# Patient Record
Sex: Male | Born: 1965 | Race: White | Hispanic: No | Marital: Single | Smoking: Current every day smoker
Health system: Southern US, Community
[De-identification: ages and names within clinical notes are randomized; demographics above are authoritative.]

## PROBLEM LIST (undated history)

## (undated) DIAGNOSIS — F1911 Other psychoactive substance abuse, in remission: Secondary | ICD-10-CM

## (undated) DIAGNOSIS — T8859XA Other complications of anesthesia, initial encounter: Secondary | ICD-10-CM

## (undated) DIAGNOSIS — Z9889 Other specified postprocedural states: Secondary | ICD-10-CM

## (undated) DIAGNOSIS — M1990 Unspecified osteoarthritis, unspecified site: Secondary | ICD-10-CM

## (undated) DIAGNOSIS — T4145XA Adverse effect of unspecified anesthetic, initial encounter: Secondary | ICD-10-CM

## (undated) DIAGNOSIS — M109 Gout, unspecified: Secondary | ICD-10-CM

## (undated) DIAGNOSIS — I499 Cardiac arrhythmia, unspecified: Secondary | ICD-10-CM

## (undated) DIAGNOSIS — C4490 Unspecified malignant neoplasm of skin, unspecified: Secondary | ICD-10-CM

## (undated) DIAGNOSIS — F329 Major depressive disorder, single episode, unspecified: Secondary | ICD-10-CM

## (undated) DIAGNOSIS — I10 Essential (primary) hypertension: Secondary | ICD-10-CM

## (undated) DIAGNOSIS — F32A Depression, unspecified: Secondary | ICD-10-CM

## (undated) DIAGNOSIS — K435 Parastomal hernia without obstruction or  gangrene: Secondary | ICD-10-CM

## (undated) DIAGNOSIS — F319 Bipolar disorder, unspecified: Secondary | ICD-10-CM

## (undated) DIAGNOSIS — B958 Unspecified staphylococcus as the cause of diseases classified elsewhere: Secondary | ICD-10-CM

## (undated) DIAGNOSIS — T7840XA Allergy, unspecified, initial encounter: Secondary | ICD-10-CM

## (undated) DIAGNOSIS — R112 Nausea with vomiting, unspecified: Secondary | ICD-10-CM

## (undated) DIAGNOSIS — K5190 Ulcerative colitis, unspecified, without complications: Secondary | ICD-10-CM

## (undated) DIAGNOSIS — K219 Gastro-esophageal reflux disease without esophagitis: Secondary | ICD-10-CM

## (undated) DIAGNOSIS — E119 Type 2 diabetes mellitus without complications: Secondary | ICD-10-CM

## (undated) HISTORY — DX: Other psychoactive substance abuse, in remission: F19.11

## (undated) HISTORY — DX: Major depressive disorder, single episode, unspecified: F32.9

## (undated) HISTORY — DX: Parastomal hernia without obstruction or gangrene: K43.5

## (undated) HISTORY — DX: Unspecified malignant neoplasm of skin, unspecified: C44.90

## (undated) HISTORY — PX: OTHER SURGICAL HISTORY: SHX169

## (undated) HISTORY — DX: Bipolar disorder, unspecified: F31.9

## (undated) HISTORY — DX: Allergy, unspecified, initial encounter: T78.40XA

## (undated) HISTORY — DX: Depression, unspecified: F32.A

---
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
## 1988-09-24 DIAGNOSIS — K5190 Ulcerative colitis, unspecified, without complications: Secondary | ICD-10-CM

## 1988-09-24 HISTORY — PX: APPENDECTOMY: SHX54

## 1988-09-24 HISTORY — PX: COLECTOMY: SHX59

## 1988-09-24 HISTORY — PX: ILEOSTOMY: SHX1783

## 1988-09-24 HISTORY — PX: TOTAL COLECTOMY: SHX852

## 1988-09-24 HISTORY — DX: Ulcerative colitis, unspecified, without complications: K51.90

## 1994-09-24 HISTORY — PX: ABDOMINAL ADHESION SURGERY: SHX90

## 1999-09-25 HISTORY — PX: SHOULDER SURGERY: SHX246

## 1999-09-25 HISTORY — PX: UMBILICAL HERNIA REPAIR: SHX196

## 2006-02-12 ENCOUNTER — Other Ambulatory Visit: Payer: Self-pay

## 2006-02-12 ENCOUNTER — Emergency Department: Payer: Self-pay | Admitting: Unknown Physician Specialty

## 2009-09-24 DIAGNOSIS — I10 Essential (primary) hypertension: Secondary | ICD-10-CM

## 2009-09-24 HISTORY — PX: HAND SURGERY: SHX662

## 2009-09-24 HISTORY — DX: Essential (primary) hypertension: I10

## 2009-12-17 ENCOUNTER — Emergency Department: Payer: Self-pay | Admitting: Unknown Physician Specialty

## 2011-09-25 DIAGNOSIS — C4490 Unspecified malignant neoplasm of skin, unspecified: Secondary | ICD-10-CM

## 2011-09-25 HISTORY — DX: Unspecified malignant neoplasm of skin, unspecified: C44.90

## 2011-11-22 ENCOUNTER — Emergency Department: Payer: Self-pay | Admitting: Internal Medicine

## 2012-06-11 ENCOUNTER — Inpatient Hospital Stay: Payer: Self-pay | Admitting: Internal Medicine

## 2012-06-11 LAB — CBC WITH DIFFERENTIAL/PLATELET
Basophil %: 0.2 %
Eosinophil #: 0 10*3/uL (ref 0.0–0.7)
HCT: 29.9 % — ABNORMAL LOW (ref 40.0–52.0)
HGB: 10.5 g/dL — ABNORMAL LOW (ref 13.0–18.0)
Lymphocyte %: 3.2 %
MCH: 30.4 pg (ref 26.0–34.0)
MCHC: 35.2 g/dL (ref 32.0–36.0)
MCV: 87 fL (ref 80–100)
Monocyte #: 0.9 x10 3/mm (ref 0.2–1.0)
Neutrophil #: 18.8 10*3/uL — ABNORMAL HIGH (ref 1.4–6.5)
RBC: 3.46 10*6/uL — ABNORMAL LOW (ref 4.40–5.90)
WBC: 20.4 10*3/uL — ABNORMAL HIGH (ref 3.8–10.6)

## 2012-06-11 LAB — BASIC METABOLIC PANEL
Anion Gap: 24 — ABNORMAL HIGH (ref 7–16)
Anion Gap: 23 — ABNORMAL HIGH (ref 7–16)
BUN: 104 mg/dL — ABNORMAL HIGH (ref 7–18)
BUN: 105 mg/dL — ABNORMAL HIGH (ref 7–18)
Calcium, Total: 6.9 mg/dL — CL (ref 8.5–10.1)
Co2: 12 mmol/L — ABNORMAL LOW (ref 21–32)
Creatinine: 18.9 mg/dL — ABNORMAL HIGH (ref 0.60–1.30)
EGFR (African American): 3 — ABNORMAL LOW
EGFR (African American): 3 — ABNORMAL LOW
EGFR (Non-African Amer.): 3 — ABNORMAL LOW
EGFR (Non-African Amer.): 3 — ABNORMAL LOW
Glucose: 76 mg/dL (ref 65–99)
Osmolality: 274 (ref 275–301)
Sodium: 120 mmol/L — CL (ref 136–145)

## 2012-06-11 LAB — URINALYSIS, COMPLETE
Glucose,UR: NEGATIVE mg/dL (ref 0–75)
Hyaline Cast: 23
Ketone: NEGATIVE
Nitrite: NEGATIVE
Protein: 100
RBC,UR: 48 /HPF (ref 0–5)
WBC UR: 34 /HPF (ref 0–5)

## 2012-06-11 LAB — COMPREHENSIVE METABOLIC PANEL
Albumin: 3.7 g/dL (ref 3.4–5.0)
Anion Gap: 36 — ABNORMAL HIGH (ref 7–16)
BUN: 114 mg/dL — ABNORMAL HIGH (ref 7–18)
Co2: 8 mmol/L — CL (ref 21–32)
Creatinine: 21.98 mg/dL — ABNORMAL HIGH (ref 0.60–1.30)
Glucose: 168 mg/dL — ABNORMAL HIGH (ref 65–99)
Osmolality: 256 (ref 275–301)
Potassium: 6.5 mmol/L (ref 3.5–5.1)
SGOT(AST): 121 U/L — ABNORMAL HIGH (ref 15–37)
Sodium: 106 mmol/L — CL (ref 136–145)
Total Protein: 9.1 g/dL — ABNORMAL HIGH (ref 6.4–8.2)

## 2012-06-11 LAB — CBC
HCT: 39.8 % — ABNORMAL LOW (ref 40.0–52.0)
HGB: 14.2 g/dL (ref 13.0–18.0)
MCH: 31 pg (ref 26.0–34.0)
MCV: 87 fL (ref 80–100)
Platelet: 406 10*3/uL (ref 150–440)
RDW: 14.5 % (ref 11.5–14.5)
WBC: 24.4 10*3/uL — ABNORMAL HIGH (ref 3.8–10.6)

## 2012-06-11 LAB — PROTIME-INR
INR: 1.1
Prothrombin Time: 15 secs — ABNORMAL HIGH (ref 11.5–14.7)

## 2012-06-11 LAB — SODIUM: Sodium: 132 mmol/L — ABNORMAL LOW (ref 136–145)

## 2012-06-11 LAB — CK TOTAL AND CKMB (NOT AT ARMC)
CK, Total: 8422 U/L — ABNORMAL HIGH (ref 35–232)
CK-MB: 74.5 ng/mL — ABNORMAL HIGH (ref 0.5–3.6)

## 2012-06-11 LAB — PHOSPHORUS: Phosphorus: 15.3 mg/dL — ABNORMAL HIGH (ref 2.5–4.9)

## 2012-06-11 LAB — APTT: Activated PTT: 37.6 secs — ABNORMAL HIGH (ref 23.6–35.9)

## 2012-06-12 LAB — COMPREHENSIVE METABOLIC PANEL
Albumin: 2.3 g/dL — ABNORMAL LOW (ref 3.4–5.0)
Alkaline Phosphatase: 87 U/L (ref 50–136)
Anion Gap: 22 — ABNORMAL HIGH (ref 7–16)
BUN: 100 mg/dL — ABNORMAL HIGH (ref 7–18)
Bilirubin,Total: 0.5 mg/dL (ref 0.2–1.0)
Calcium, Total: 6.6 mg/dL — CL (ref 8.5–10.1)
Chloride: 82 mmol/L — ABNORMAL LOW (ref 98–107)
Glucose: 112 mg/dL — ABNORMAL HIGH (ref 65–99)
Potassium: 3.5 mmol/L (ref 3.5–5.1)
SGOT(AST): 87 U/L — ABNORMAL HIGH (ref 15–37)
Sodium: 118 mmol/L — CL (ref 136–145)
Total Protein: 5.8 g/dL — ABNORMAL LOW (ref 6.4–8.2)

## 2012-06-12 LAB — CBC WITH DIFFERENTIAL/PLATELET
Basophil #: 0 10*3/uL (ref 0.0–0.1)
Basophil %: 0.1 %
Eosinophil #: 0.1 10*3/uL (ref 0.0–0.7)
HCT: 29.4 % — ABNORMAL LOW (ref 40.0–52.0)
HGB: 10.4 g/dL — ABNORMAL LOW (ref 13.0–18.0)
Lymphocyte %: 3.3 %
MCH: 30.4 pg (ref 26.0–34.0)
MCHC: 35.3 g/dL (ref 32.0–36.0)
Monocyte #: 1 x10 3/mm (ref 0.2–1.0)
Neutrophil #: 13 10*3/uL — ABNORMAL HIGH (ref 1.4–6.5)
Neutrophil %: 89.4 %
Platelet: 207 10*3/uL (ref 150–440)

## 2012-06-12 LAB — CK: CK, Total: 4129 U/L — ABNORMAL HIGH (ref 35–232)

## 2012-06-12 LAB — SODIUM
Sodium: 117 mmol/L — CL (ref 136–145)
Sodium: 117 mmol/L — CL (ref 136–145)
Sodium: 116 mmol/L — CL (ref 136–145)
Sodium: 118 mmol/L — CL (ref 136–145)
Sodium: 119 mmol/L — CL (ref 136–145)

## 2012-06-12 LAB — URINE CULTURE

## 2012-06-12 LAB — MAGNESIUM: Magnesium: 1.3 mg/dL — ABNORMAL LOW

## 2012-06-13 LAB — BASIC METABOLIC PANEL
Anion Gap: 16 (ref 7–16)
Anion Gap: 14 (ref 7–16)
Anion Gap: 15 (ref 7–16)
Anion Gap: 12 (ref 7–16)
BUN: 87 mg/dL — ABNORMAL HIGH (ref 7–18)
BUN: 86 mg/dL — ABNORMAL HIGH (ref 7–18)
BUN: 79 mg/dL — ABNORMAL HIGH (ref 7–18)
BUN: 76 mg/dL — ABNORMAL HIGH (ref 7–18)
Calcium, Total: 7.1 mg/dL — ABNORMAL LOW (ref 8.5–10.1)
Calcium, Total: 7.1 mg/dL — ABNORMAL LOW (ref 8.5–10.1)
Calcium, Total: 7.3 mg/dL — ABNORMAL LOW (ref 8.5–10.1)
Calcium, Total: 7.8 mg/dL — ABNORMAL LOW (ref 8.5–10.1)
Chloride: 93 mmol/L — ABNORMAL LOW (ref 98–107)
Co2: 19 mmol/L — ABNORMAL LOW (ref 21–32)
Co2: 18 mmol/L — ABNORMAL LOW (ref 21–32)
Creatinine: 6.95 mg/dL — ABNORMAL HIGH (ref 0.60–1.30)
Creatinine: 6.21 mg/dL — ABNORMAL HIGH (ref 0.60–1.30)
Creatinine: 4.82 mg/dL — ABNORMAL HIGH (ref 0.60–1.30)
EGFR (African American): 10 — ABNORMAL LOW
EGFR (African American): 11 — ABNORMAL LOW
EGFR (African American): 23 — ABNORMAL LOW
EGFR (Non-African Amer.): 9 — ABNORMAL LOW
EGFR (Non-African Amer.): 20 — ABNORMAL LOW
Glucose: 96 mg/dL (ref 65–99)
Glucose: 137 mg/dL — ABNORMAL HIGH (ref 65–99)
Glucose: 92 mg/dL (ref 65–99)
Glucose: 93 mg/dL (ref 65–99)
Osmolality: 277 (ref 275–301)
Osmolality: 281 (ref 275–301)
Potassium: 3.3 mmol/L — ABNORMAL LOW (ref 3.5–5.1)
Potassium: 3.7 mmol/L (ref 3.5–5.1)
Potassium: 3.4 mmol/L — ABNORMAL LOW (ref 3.5–5.1)
Sodium: 123 mmol/L — ABNORMAL LOW (ref 136–145)
Sodium: 123 mmol/L — ABNORMAL LOW (ref 136–145)

## 2012-06-13 LAB — CK: CK, Total: 1538 U/L — ABNORMAL HIGH (ref 35–232)

## 2012-06-13 LAB — COMPREHENSIVE METABOLIC PANEL
Albumin: 2.2 g/dL — ABNORMAL LOW (ref 3.4–5.0)
Alkaline Phosphatase: 83 U/L (ref 50–136)
BUN: 88 mg/dL — ABNORMAL HIGH (ref 7–18)
Bilirubin,Total: 0.5 mg/dL (ref 0.2–1.0)
Calcium, Total: 6.9 mg/dL — CL (ref 8.5–10.1)
Chloride: 86 mmol/L — ABNORMAL LOW (ref 98–107)
Creatinine: 7.6 mg/dL — ABNORMAL HIGH (ref 0.60–1.30)
EGFR (Non-African Amer.): 8 — ABNORMAL LOW
Glucose: 128 mg/dL — ABNORMAL HIGH (ref 65–99)
Osmolality: 273 (ref 275–301)
Sodium: 121 mmol/L — ABNORMAL LOW (ref 136–145)
Total Protein: 5.5 g/dL — ABNORMAL LOW (ref 6.4–8.2)

## 2012-06-13 LAB — CBC WITH DIFFERENTIAL/PLATELET
Basophil #: 0 10*3/uL (ref 0.0–0.1)
Basophil %: 0.4 %
Eosinophil #: 0.1 10*3/uL (ref 0.0–0.7)
HGB: 9.5 g/dL — ABNORMAL LOW (ref 13.0–18.0)
Lymphocyte #: 0.8 10*3/uL — ABNORMAL LOW (ref 1.0–3.6)
MCH: 29.6 pg (ref 26.0–34.0)
MCHC: 34.5 g/dL (ref 32.0–36.0)
MCV: 86 fL (ref 80–100)
Monocyte #: 1 x10 3/mm (ref 0.2–1.0)
Monocyte %: 7.3 %
Neutrophil #: 11.5 10*3/uL — ABNORMAL HIGH (ref 1.4–6.5)
Neutrophil %: 85.7 %
WBC: 13.4 10*3/uL — ABNORMAL HIGH (ref 3.8–10.6)

## 2012-06-14 LAB — BASIC METABOLIC PANEL
Anion Gap: 15 (ref 7–16)
Anion Gap: 14 (ref 7–16)
Calcium, Total: 7.3 mg/dL — ABNORMAL LOW (ref 8.5–10.1)
Calcium, Total: 7.4 mg/dL — ABNORMAL LOW (ref 8.5–10.1)
Calcium, Total: 7.6 mg/dL — ABNORMAL LOW (ref 8.5–10.1)
Chloride: 100 mmol/L (ref 98–107)
Chloride: 99 mmol/L (ref 98–107)
Co2: 17 mmol/L — ABNORMAL LOW (ref 21–32)
Co2: 18 mmol/L — ABNORMAL LOW (ref 21–32)
Co2: 19 mmol/L — ABNORMAL LOW (ref 21–32)
Creatinine: 2.21 mg/dL — ABNORMAL HIGH (ref 0.60–1.30)
EGFR (African American): 31 — ABNORMAL LOW
EGFR (African American): 40 — ABNORMAL LOW
EGFR (African American): 59 — ABNORMAL LOW
EGFR (Non-African Amer.): 26 — ABNORMAL LOW
EGFR (Non-African Amer.): 51 — ABNORMAL LOW
Glucose: 105 mg/dL — ABNORMAL HIGH (ref 65–99)
Osmolality: 282 (ref 275–301)
Osmolality: 284 (ref 275–301)
Osmolality: 285 (ref 275–301)
Sodium: 130 mmol/L — ABNORMAL LOW (ref 136–145)
Sodium: 133 mmol/L — ABNORMAL LOW (ref 136–145)

## 2012-06-17 LAB — CULTURE, BLOOD (SINGLE)

## 2012-06-19 ENCOUNTER — Other Ambulatory Visit: Payer: Self-pay | Admitting: Nephrology

## 2012-06-19 LAB — COMPREHENSIVE METABOLIC PANEL
Alkaline Phosphatase: 107 U/L (ref 50–136)
Anion Gap: 7 (ref 7–16)
BUN: 12 mg/dL (ref 7–18)
Bilirubin,Total: 0.5 mg/dL (ref 0.2–1.0)
Chloride: 106 mmol/L (ref 98–107)
Co2: 25 mmol/L (ref 21–32)
Creatinine: 1.04 mg/dL (ref 0.60–1.30)
EGFR (African American): >60
EGFR (Non-African Amer.): >60
Osmolality: 274 (ref 275–301)
Potassium: 4.5 mmol/L (ref 3.5–5.1)
SGPT (ALT): 42 U/L (ref 12–78)
Sodium: 138 mmol/L (ref 136–145)
Total Protein: 7.6 g/dL (ref 6.4–8.2)

## 2012-07-06 ENCOUNTER — Emergency Department: Payer: Self-pay | Admitting: Emergency Medicine

## 2012-07-06 LAB — URINALYSIS, COMPLETE
Blood: NEGATIVE
Leukocyte Esterase: NEGATIVE
Ph: 6 (ref 4.5–8.0)
Protein: NEGATIVE
RBC,UR: 1 /HPF (ref 0–5)

## 2012-07-06 LAB — COMPREHENSIVE METABOLIC PANEL
Alkaline Phosphatase: 153 U/L — ABNORMAL HIGH (ref 50–136)
Anion Gap: 11 (ref 7–16)
BUN: 18 mg/dL (ref 7–18)
Bilirubin,Total: 0.9 mg/dL (ref 0.2–1.0)
Chloride: 102 mmol/L (ref 98–107)
Creatinine: 1.37 mg/dL — ABNORMAL HIGH (ref 0.60–1.30)
EGFR (African American): >60
EGFR (Non-African Amer.): >60
Osmolality: 273 (ref 275–301)
SGPT (ALT): 34 U/L (ref 12–78)
Sodium: 135 mmol/L — ABNORMAL LOW (ref 136–145)
Total Protein: 7.9 g/dL (ref 6.4–8.2)

## 2012-07-06 LAB — CBC
HCT: 34.5 % — ABNORMAL LOW (ref 40.0–52.0)
HGB: 11.9 g/dL — ABNORMAL LOW (ref 13.0–18.0)
MCH: 30.3 pg (ref 26.0–34.0)
MCV: 88 fL (ref 80–100)
RBC: 3.93 10*6/uL — ABNORMAL LOW (ref 4.40–5.90)
WBC: 10.4 10*3/uL (ref 3.8–10.6)

## 2012-07-06 LAB — CK: CK, Total: 39 U/L (ref 35–232)

## 2012-07-17 ENCOUNTER — Other Ambulatory Visit: Payer: Self-pay

## 2012-07-17 LAB — CBC WITH DIFFERENTIAL/PLATELET
Basophil #: 0.1 10*3/uL (ref 0.0–0.1)
Basophil %: 0.6 %
HCT: 38.5 % — ABNORMAL LOW (ref 40.0–52.0)
Lymphocyte #: 1.5 10*3/uL (ref 1.0–3.6)
Lymphocyte %: 10.1 %
MCHC: 33.9 g/dL (ref 32.0–36.0)
MCV: 89 fL (ref 80–100)
Monocyte #: 0.9 x10 3/mm (ref 0.2–1.0)
Monocyte %: 6.3 %
Neutrophil #: 12 10*3/uL — ABNORMAL HIGH (ref 1.4–6.5)
Neutrophil %: 82.5 %
Platelet: 381 10*3/uL (ref 150–440)
RDW: 14.5 % (ref 11.5–14.5)
WBC: 14.6 10*3/uL — ABNORMAL HIGH (ref 3.8–10.6)

## 2012-07-17 LAB — COMPREHENSIVE METABOLIC PANEL
Albumin: 3.9 g/dL (ref 3.4–5.0)
Alkaline Phosphatase: 124 U/L (ref 50–136)
Anion Gap: 9 (ref 7–16)
Calcium, Total: 9.3 mg/dL (ref 8.5–10.1)
Chloride: 104 mmol/L (ref 98–107)
Co2: 22 mmol/L (ref 21–32)
Creatinine: 1.03 mg/dL (ref 0.60–1.30)
EGFR (African American): >60
EGFR (Non-African Amer.): >60
Glucose: 92 mg/dL (ref 65–99)
SGOT(AST): 24 U/L (ref 15–37)
SGPT (ALT): 30 U/L (ref 12–78)

## 2012-10-10 ENCOUNTER — Emergency Department: Payer: Self-pay | Admitting: Emergency Medicine

## 2012-10-10 LAB — CBC WITH DIFFERENTIAL/PLATELET
Basophil #: 0.1 10*3/uL (ref 0.0–0.1)
Eosinophil %: 0.6 %
HGB: 15.6 g/dL (ref 13.0–18.0)
Lymphocyte %: 9.5 %
MCH: 28.8 pg (ref 26.0–34.0)
MCHC: 33.8 g/dL (ref 32.0–36.0)
Monocyte #: 0.8 x10 3/mm (ref 0.2–1.0)
Monocyte %: 6.7 %
Neutrophil %: 82.1 %
RDW: 14.4 % (ref 11.5–14.5)
WBC: 12.1 10*3/uL — ABNORMAL HIGH (ref 3.8–10.6)

## 2012-10-10 LAB — URINALYSIS, COMPLETE
Bacteria: NONE SEEN
Bilirubin,UR: NEGATIVE
Blood: NEGATIVE
Glucose,UR: NEGATIVE mg/dL (ref 0–75)
Nitrite: NEGATIVE
Protein: 30
RBC,UR: 1 /HPF (ref 0–5)
Specific Gravity: 1.03 (ref 1.003–1.030)
Squamous Epithelial: <1

## 2012-10-10 LAB — COMPREHENSIVE METABOLIC PANEL
Anion Gap: 11 (ref 7–16)
BUN: 12 mg/dL (ref 7–18)
Calcium, Total: 9.7 mg/dL (ref 8.5–10.1)
Co2: 20 mmol/L — ABNORMAL LOW (ref 21–32)
Creatinine: 1.22 mg/dL (ref 0.60–1.30)
Glucose: 95 mg/dL (ref 65–99)
Sodium: 133 mmol/L — ABNORMAL LOW (ref 136–145)
Total Protein: 8.5 g/dL — ABNORMAL HIGH (ref 6.4–8.2)

## 2013-03-11 ENCOUNTER — Emergency Department: Payer: Self-pay

## 2013-03-11 LAB — URINALYSIS, COMPLETE
Bilirubin,UR: NEGATIVE
Blood: NEGATIVE
Glucose,UR: NEGATIVE mg/dL (ref 0–75)
Hyaline Cast: 2
Leukocyte Esterase: NEGATIVE
Nitrite: NEGATIVE
RBC,UR: 1 /HPF (ref 0–5)
Specific Gravity: 1.029 (ref 1.003–1.030)
Squamous Epithelial: NONE SEEN

## 2013-03-11 LAB — CK TOTAL AND CKMB (NOT AT ARMC): CK-MB: <0.5 ng/mL — ABNORMAL LOW (ref 0.5–3.6)

## 2013-03-11 LAB — TROPONIN I: Troponin-I: <0.02 ng/mL

## 2013-03-11 LAB — BASIC METABOLIC PANEL
Calcium, Total: 9.6 mg/dL (ref 8.5–10.1)
Co2: 24 mmol/L (ref 21–32)
EGFR (African American): >60
EGFR (Non-African Amer.): >60
Glucose: 115 mg/dL — ABNORMAL HIGH (ref 65–99)
Potassium: 3.5 mmol/L (ref 3.5–5.1)
Sodium: 135 mmol/L — ABNORMAL LOW (ref 136–145)

## 2013-03-11 LAB — CBC
HCT: 43.9 % (ref 40.0–52.0)
MCH: 28.7 pg (ref 26.0–34.0)
MCHC: 33.9 g/dL (ref 32.0–36.0)
MCV: 85 fL (ref 80–100)
Platelet: 355 10*3/uL (ref 150–440)
RDW: 14.7 % — ABNORMAL HIGH (ref 11.5–14.5)
WBC: 13.8 10*3/uL — ABNORMAL HIGH (ref 3.8–10.6)

## 2013-04-26 ENCOUNTER — Emergency Department: Payer: Self-pay | Admitting: Emergency Medicine

## 2013-04-26 LAB — URINALYSIS, COMPLETE
Bilirubin,UR: NEGATIVE
Ketone: NEGATIVE
Nitrite: NEGATIVE
Ph: 6 (ref 4.5–8.0)
Protein: 30
Specific Gravity: 1.019 (ref 1.003–1.030)
Squamous Epithelial: NONE SEEN

## 2013-04-26 LAB — TROPONIN I: Troponin-I: <0.02 ng/mL

## 2013-04-26 LAB — COMPREHENSIVE METABOLIC PANEL
Alkaline Phosphatase: 136 U/L (ref 50–136)
Bilirubin,Total: 1.5 mg/dL — ABNORMAL HIGH (ref 0.2–1.0)
Co2: 24 mmol/L (ref 21–32)
EGFR (African American): >60
EGFR (Non-African Amer.): >60
Glucose: 110 mg/dL — ABNORMAL HIGH (ref 65–99)
Osmolality: 267 (ref 275–301)
SGOT(AST): 26 U/L (ref 15–37)
Sodium: 133 mmol/L — ABNORMAL LOW (ref 136–145)
Total Protein: 8.7 g/dL — ABNORMAL HIGH (ref 6.4–8.2)

## 2013-04-26 LAB — CBC
HCT: 47.2 % (ref 40.0–52.0)
HGB: 16.5 g/dL (ref 13.0–18.0)
Platelet: 389 10*3/uL (ref 150–440)
WBC: 13.8 10*3/uL — ABNORMAL HIGH (ref 3.8–10.6)

## 2013-07-29 LAB — LIPID PANEL
CHOLESTEROL: 159 mg/dL (ref 0–200)
HDL: 69 mg/dL (ref 35–70)
LDL CALC: 49 mg/dL
LDL/HDL RATIO: 0.7
TRIGLYCERIDES: 207 mg/dL — AB (ref 40–160)

## 2013-08-18 ENCOUNTER — Ambulatory Visit: Payer: Self-pay | Admitting: Family Medicine

## 2013-12-17 LAB — PSA: PSA: 1.5

## 2014-09-28 DIAGNOSIS — C4910 Malignant neoplasm of connective and soft tissue of unspecified upper limb, including shoulder: Secondary | ICD-10-CM | POA: Insufficient documentation

## 2014-10-14 LAB — BASIC METABOLIC PANEL
BUN: 13 mg/dL (ref 4–21)
CREATININE: 1.2 mg/dL (ref 0.6–1.3)
Glucose: 109 mg/dL
SODIUM: 137 mmol/L (ref 137–147)

## 2014-10-14 LAB — TSH: TSH: 0.77 u[IU]/mL (ref 0.41–5.90)

## 2014-10-14 LAB — CBC AND DIFFERENTIAL
NEUTROS ABS: 9 /uL
WBC: 11 10*3/mL

## 2014-10-14 LAB — HEPATIC FUNCTION PANEL
ALT: 49 U/L — AB (ref 10–40)
AST: 40 U/L (ref 14–40)
Alkaline Phosphatase: 107 U/L (ref 25–125)

## 2015-01-11 NOTE — Consult Note (Signed)
Brief Consult Note: Diagnosis: Nausea, vomiting and renal failure.   Patient was seen by consultant.   Consult note dictated.   Comments: Patient with renal failure and severe hyponatremia. History of nausea and vomiting but no change in ileostomy output. Doubt UC has anything to with this episode as he has no colon left and ileostomy seems to at baseline.  Recommendations: Continue IV antibiotics. Clear liquids, advance as tolerated. Will follow.  Electronic Signatures: Jill Side (MD)  (Signed 18-Sep-13 18:44)  Authored: Brief Consult Note   Last Updated: 18-Sep-13 18:44 by Jill Side (MD)

## 2015-01-11 NOTE — Consult Note (Signed)
PATIENT NAME:  Joshua Mays, Joshua Mays MR#:  [id number] DATE OF BIRTH:  [birth year]  DATE OF CONSULTATION:    REFERRING PHYSICIAN:  Dr. Darvin Neighbours  CONSULTING PHYSICIAN:  Marda Breidenbach Lilian Kapur, MD  REASON FOR CONSULTATION: Severe acute renal failure, metabolic acidosis, hyponatremia, hyperkalemia.   HISTORY OF PRESENT ILLNESS: The patient is a 49 year old Caucasian male with past medical history of hypertension, history of polysubstance dependence, history of borderline personality disorder, and depression with anxiety who presented to [facility] after not being able to walk at home. Patient reports that he has had nausea and vomiting ongoing for the past week. He has had very limited p.o. and fluid intake over this period of time. In addition to this, he reports that he was taking ibuprofen, sometimes up to 2400 mg over the past week. He has reported quite diminished urine output and urine that he was making was quite dark in color. When he presented he had multiple metabolic derangements including a BUN of 114, creatinine of 21, serum sodium of 106, serum potassium of 6.5, serum bicarbonate quite low at 8, phosphorus of 15. He also has evidence for mild rhabdomyolysis with CK of 8422. ABG showed pH 7.26, pCO2 24, pO2 136, FiO2 28%. Patient is remarkably awake despite these laboratory studies. He has tremors at baseline and does have intermittent confusion but is able to offer some elements of the history at this point in time. I was asked by Dr. Darvin Neighbours to evaluate the patient. He received 5 liters of normal saline infusion in the Emergency Department. We asked this infusion rate to be slowed given the fact that there is likely some element of chronicity to his metabolic derangements in particular his serum sodium. The patient's mother also arrived at bedside during the interview.   PAST MEDICAL HISTORY:  1. Ulcerative colitis.  2. Status post colectomy and ileostomy in 1990.  3. Left shoulder  surgery.  4. Polysubstance abuse.  5. Hypertension.  6. Depression/anxiety.  7. Borderline personality disorder with multiple psychiatric admissions.   ALLERGIES: Ceclor, Cipro, and Toradol.   CURRENT INPATIENT MEDICATIONS:  1. Heparin 5000 units subcutaneous every 12 hours. 2. Lorazepam 0.5 mg IV every two hours p.r.n.  3. Morphine 2 to 4 mg IV every four hours p.r.n. pain.  4. Zofran 4 mg IV every four hours p.r.n. nausea, vomiting.  5. Zosyn 3.375 grams IV q.12 hours. 6. Norepinephrine drip.  7. D5 water at 200 mL/h.  SOCIAL HISTORY: The patient lives alone. He is divorced. He smokes cigarettes occasionally. Has history of heavy alcohol use in the past. Has also used illicit drugs in the past including cocaine per dictated psychiatric note.   FAMILY HISTORY: Mother is alive and at the bedside today. She denies any medical history. Patient's father has history of diabetes mellitus and also had history with alcoholism in the past.    REVIEW OF SYSTEMS: CONSTITUTIONAL: Denies fevers, chills, weight loss. EYES: Reported some blurry vision during the course of this past week. HEENT: Denies headaches, hearing loss. Denies epistaxis, sore throat. CARDIOVASCULAR: Denies chest pain, palpitations, PND. RESPIRATORY: Denies cough, shortness of breath, hemoptysis. GASTROINTESTINAL: Has had extensive nausea and vomiting. Denies increased daily ostomy output over the past week. Denies blood in his fecal contents. GENITOURINARY: Denies frequency, urgency. Reports diminished urine output and darkened urine recently. MUSCULOSKELETAL: Denies joint pain, swelling, or redness. INTEGUMENTARY: Denies skin rashes or lesions. NEUROLOGIC: Reports generalized weakness and was unable to walk this morning. PSYCHIATRIC: Has history of  borderline personality disorder as well as depression. ENDOCRINE: Denies polyuria, polydipsia, or polyphagia. HEMATOLOGIC/LYMPHATIC: Denies easy bruisability, bleeding, or swollen lymph  nodes. ALLERGY/IMMUNOLOGIC: Denies seasonal allergies or history of immunodeficiency.   PHYSICAL EXAMINATION:  VITAL SIGNS: Temperature 97.5, pulse 104, respirations 23, blood pressure 101/34, pulse oximetry 95% on 2 liters.   GENERAL: Well developed, well nourished Caucasian male who appears anxious and has a tremor on exam.   HEENT: Normocephalic, atraumatic. Extraocular movements are intact. Pupils are equal, round, reactive to light. No scleral icterus. Conjunctivae are pink. No epistaxis noted. Gross hearing intact. Oral mucosa are dry.   NECK: Supple without JVD or lymphadenopathy.   LUNGS: Clear to auscultation bilaterally with normal respiratory effort.   CARDIOVASCULAR: S1 and S2 regular rate and rhythm. No murmur, rubs or gallops appreciated.  ABDOMEN: soft, nontender, nondistended. Bowel sounds positive. No rebound or guarding. No gross organomegaly appreciated.   EXTREMITIES: No clubbing, cyanosis. Trace edema noted.   NEUROLOGICAL: Patient is awake and alert. He is oriented to self and place. At times he is slow to respond to questions and does appear to get confused during the conversation.   GENITOURINARY: No suprapubic tenderness noted at this time.   MUSCULOSKELETAL: No joint redness, swelling or tenderness appreciated.   SKIN: Warm and dry. No rashes noted.   PSYCHIATRIC: Patient quite anxious at this point in time. He has limited insight into his current illness.   LABORATORY, DIAGNOSTIC AND RADIOLOGICAL DATA: Sodium 106, potassium 6.5, chloride 62, CO2 8, BUN 114, creatinine 21, glucose 168, phosphorus 15, magnesium 1.6, lipase 411, total protein 9.1, albumin 3.7, total bilirubin 0.5, alkaline phosphatase 124, AST 121, ALT 63, CK 8422, CK-MB 74.5. CBC shows WBC 24.4, hemoglobin 14, hematocrit 39, platelets 406. PT 15, INR 1.1, PTT 37.6. ABG shows pH 7.26, pCO2 24, pO2 136, FiO2 28%.   IMPRESSION: This is a 49 year old Caucasian male with past medical history of  borderline personality disorder, hypertension, ulcerative colitis status post colectomy and ileostomy in 1990, left shoulder surgery, tobacco abuse, polysubstance abuse including cocaine in the past who presented to [facility] with nausea, vomiting ongoing for one week. He was also taking ibuprofen up to 2400 mg some days over the past week. He now presents with severe acute renal failure with a creatinine of 21 and BUN of 114, severe hyponatremia with a serum sodium of 106, hyperkalemia with a potassium of 6.5, metabolic acidosis with serum bicarbonate of 8.   PROBLEM LIST:  1. Severe acute renal failure.  2. Hyponatremia.  3. Hyperkalemia.  4. Metabolic acidosis.  5. Extended nausea and vomiting with volume depletion.  6. Rhabdomyolysis.   PLAN: The patient presents with severe metabolic derangements at this point in time. He has acute renal failure, hyponatremia, hyperkalemia, metabolic acidosis. These issues pose a fairly complicated medical management. We are likely to consider hemodialysis in this situation, however, his hyponatremia complicates the medical picture. Certainly we would not want to overcorrect the sodium too quickly as there does appear to be some chronicity to his hyponatremia as he has had severe nausea and vomiting over the past week. Therefore, I recommend stat lab recheck at this point in time. Patient was administered 5 liters of normal saline in the Emergency Department; however, we discussed the case with Dr. Darvin Neighbours and have suggested now that he be switched to D5W to prevent the sodium from rising to quickly. We will check serum sodium every two hours for right now. We will make  ongoing adjustments to his fluid management as needed. The goal correction for his serum sodium in the next 24 hours would be to 118 We will also need to monitor serum potassium as well as his underlying metabolic acidosis quite closely. If the sodium is fairly stable we may  consider switching fluids to a bicarbonate based solution. Given his nausea and vomiting I am hesitant to give him oral Kayexalate at this time. We could potentially treat the hypokalemia with shifting methods. However, if potassium continues to rise, we may have no choice but to consider hemodialysis. We will likely need to alter the sodium concentration of the dialysate if we perform dialysis, however. The case was discussed extensively with the patient as well as with Dr. Darvin Neighbours. We will be following the patient quite closely over the next several days.   I would like to thank Dr. Darvin Neighbours for this complex consultation.  ____________________________ Tama High, MD mnl:cms D: 06/11/2012 12:51:33 ET T: 06/11/2012 13:28:37 ET  JOB#: [id number] cc: Tama High, MD, <Dictator> Mariah Milling Laiah Pouncey MD ELECTRONICALLY SIGNED 07/01/2012 23:29

## 2015-01-11 NOTE — H&P (Signed)
PATIENT NAME:  Joshua Mays, Joshua Mays MR#:  [id number] DATE OF BIRTH:  1966-08-31  DATE OF ADMISSION:  06/11/2012  PRIMARY CARE PHYSICIAN: Dr. Rosanna Randy ER REFERRING PHYSICIAN: Dr. Loletta Specter   History obtained from patient, old records have been reviewed. Case discussed with Dr. Michel Santee and Dr. Holley Raring of nephrology. Imaging studies and EKG personally reviewed.   CHIEF COMPLAINT: Nausea, vomiting and not feeling well.   HISTORY OF PRESENT ILLNESS: 50 year old Caucasian male patient with history of ulcerative colitis status post colectomy and ileostomy in 1990 presents to the Emergency Room complaining of nausea, vomiting of a week. Patient has not had any abdominal pain, chest pain, shortness of breath. Decreased dark urine, no burning. Does not have any cough, fever, chills. On arrival to the Emergency Room patient was found to be hypotensive at 65/34. Patient is status post resuscitation with 4 liters of normal saline and is on a Levophed drip with blood pressure of 79/45. Patient has had decreased ileostomy output which is dark brown.   PAST MEDICAL HISTORY: 1. Depression, anxiety.  2. Ulcerative colitis.  3. Status post colectomy and ileostomy in 1990.  4. Left shoulder surgery.  5. Tobacco abuse.  6. Past history of alcohol abuse.  7. Hypertension.  SOCIAL HISTORY: Patient abused alcohol in the past, presently has not had any alcohol in the last one week. Smokes a pack a day. No illicit drugs. Lives alone. Is on disability.   ALLERGIES: Ceclor, ciprofloxacin, Toradol.   FAMILY HISTORY: No family history of ulcerative colitis or coronary artery disease.    HOME MEDICATIONS:  1. Lisinopril 10 mg oral once a day. 2. Seroquel 200 mg oral once a day:    REVIEW OF SYSTEMS: CONSTITUTIONAL: Complains of fatigue and weakness. No fever. Has lost 20 pounds in the last one month. EYES: No blurred vision, pain, redness. ENT: No tinnitus, ear pain, hearing loss. RESPIRATORY: No cough, wheeze,  hemoptysis. CARDIOVASCULAR: No chest pain, orthopnea, edema. GASTROINTESTINAL: Complains of nausea, vomiting. No abdominal pain, no diarrhea. GENITOURINARY: Dark urine, decreased urination. No frequency. ENDOCRINE: No polyuria, nocturia, thyroid problems. HEMATOLOGIC/LYMPHATIC: No anemia, easy bruising, bleeding. INTEGUMENTARY: No acne, rash, lesions. MUSCULOSKELETAL: No neck, back, shoulder pain. No arthritis. NEUROLOGIC: No numbness, weakness, dysarthria. PSYCHIATRIC: Has anxiety, depression.   PHYSICAL EXAMINATION:  VITAL SIGNS: Temperature 98, pulse 71, respirations 20, blood pressure 65/34, presently on Levothroid at 85/65, saturating 100% on 2 liters oxygen.   GENERAL: Obese, Caucasian male patient lying in bed with chills, anxious.  PSYCH: Alert, oriented x3, anxious, good judgment.   HEENT: Atraumatic, normocephalic. Pallor positive. Oral mucosa extremely dry. No oral ulcers, thrush. Pupils bilaterally equal and reactive to light.   NECK: Supple. No thyromegaly. No palpable lymph nodes. Trachea midline. No carotid bruit or JVD.   CARDIOVASCULAR: S1, S2, tachycardic.   RESPIRATORY: Normal work of breathing. Clear to auscultation on both sides.   GASTROINTESTINAL: Soft. Scars from prior surgery. Has an ileostomy bag. No discharge or erythema around the bag. No swelling.   GENITOURINARY: No CVA tenderness or bladder distention. Genital examination normal.   SKIN: Warm and dry. No petechiae, rash, ulcers.   NEUROLOGICAL: Motor strength 5/5 in upper and lower extremities. Sensation to fine touch intact all over.   LABORATORY, DIAGNOSTIC AND RADIOLOGICAL DATA: Laboratory studies show glucose 168, BUN 114, creatinine 21.98, sodium 106, potassium 6.5, bicarbonate 8, anion gap 36, lipase 411, albumin 3.7, CK 8422, troponin less than 0.02, WBC 24.4, hemoglobin 14.2. pH 7.26, pCO2 24, pO2 136, lactic  acid 2.2.   EKG normal sinus rhythm. No hyperkalemic changes noticed.   ASSESSMENT AND  PLAN:  1. Shock, likely hypovolemic. Will continue aggressive IV fluid resuscitation, normal saline bolus stat, continue Levophed. This is likely secondary from severe dehydration considering severe hyponatremia of 106 and chloride 62. Patient is awake and alert at this time. Will be admitted to the Critical Care Unit with shock.  2. Acute renal failure, likely prerenal. Patient has received 4 liters normal saline, has not made any urine yet. Will place a Foley. Will get a CT scan to look at his abdomen and also assist the kidneys for any obstruction. Discussed this case with Dr. Holley Raring. Patient might need dialysis if there is no good urine output.  3. Hyperkalemia secondary to acute renal failure. Patient has received insulin, glucose, bicarbonate. No EKG changes.  4. Rhabdomyolysis with CK of 8400 secondary to dehydration.  5. Questionable sepsis. Patient does have elevated white count of 24.4. Neutrophil count is not available but is definitely hypotensive. Will start him on Zosyn. Get blood cultures. Lactic acid is 2.2. His leukocytosis could be secondary to hemoconcentration and shock likely from hyponatremia but will cover with antibiotics at this time. Get CT scan of abdomen to look for any infective focus.  6. Severe hyponatremia, asymptomatic. Patient being asymptomatic this seems acute. Will have to monitor closely for increase in the sodium correction. Discussed with Dr. Holley Raring. Will check STAT BMP. Pt has recieved 5 liters of NS by now. Will switch to D5 for now. 7. Ulcerative colitis.  This seems to be the cause for patient's nausea, vomiting at this time. Will consult GI for further input with the case and follow up on the CT.  8. Anion gap metabolic acidosis secondary to acute renal failure.  9. Deep vein thrombosis prophylaxis with heparin.  10. CODE STATUS: FULL CODE.      TIME SPENT: Time spent today on this case was more than 75 minutes with more than 50% time spent in coordination  of care.   ____________________________ Leia Alf. Eniola Cerullo, MD srs:cms D: 06/11/2012 10:13:50 ET T: 06/11/2012 10:32:47 ET JOB#: [id number]  cc: Alveta Heimlich R. Jabez Molner, MD, <Dictator> Richard L. Rosanna Randy, MD Neita Carp MD ELECTRONICALLY SIGNED 06/11/2012 11:20

## 2015-01-11 NOTE — Consult Note (Signed)
Brief Consult Note: Diagnosis: depression nos.   Patient was seen by consultant.   Consult note dictated.   Recommend further assessment or treatment.   Comments: Psychiatry: Patient seen. Chart reviewed. No change to medication. PAtient very much needs therapy after discharge. Will follow.  Electronic Signatures: Clapacs, Madie Reno (MD)  (Signed 20-Sep-13 22:38)  Authored: Brief Consult Note   Last Updated: 20-Sep-13 22:38 by Gonzella Lex (MD)

## 2015-01-11 NOTE — Consult Note (Signed)
Chief Complaint:   Subjective/Chief Complaint No nausea or vomiting. Ostomy moving well.   VITAL SIGNS/ANCILLARY NOTES: **Vital Signs.:   19-Sep-13 09:12   Vital Signs Type Routine   Temperature Source tympanic   Pulse Pulse 84   Respirations Respirations 16   Systolic BP Systolic BP 132   Diastolic BP (mmHg) Diastolic BP (mmHg) 54   Mean BP 75   Pulse Ox % Pulse Ox % 97   Oxygen Delivery Room Air/ 21 %   Pulse Ox Heart Rate 104   Brief Assessment:   Additional Physical Exam Abdomen is soft and benign with fair bowel sounds.   Lab Results: Routine Chem:  19-Sep-13 09:55    Result Comment SODIUM - RESULTS VERIFIED BY REPEAT TESTING.  - NOTIFIED OF CRITICAL VALUE  - CALLED TO CARLA FOUST AT 4401  - ON 06/12/12 BY GA  - READ-BACK PROCESS PERFORMED.  Result(s) reported on 12 Jun 2012 at 10:35AM.   Sodium, Serum  117   Assessment/Plan:  Assessment/Plan:   Assessment History of UC. Doubt UC has to do anything with his current issues as he has no colon left and has no change in ostomy output. Nausea and vomiting either due to viral gastroenteritis which then progressed to renal failuyre or the nausea and vomitig was secondary to renal failure. Renal failure. Severe hyponatremia.    Plan Advance diet as tolerated. No further recommendations. Will sign off. Please call on call GI if needed. Thanks.   Electronic Signatures: Jill Side (MD)  (Signed 19-Sep-13 10:53)  Authored: Chief Complaint, VITAL SIGNS/ANCILLARY NOTES, Brief Assessment, Lab Results, Assessment/Plan   Last Updated: 19-Sep-13 10:53 by Jill Side (MD)

## 2015-01-11 NOTE — Discharge Summary (Signed)
PATIENT NAME:  LYRICK, LAGRAND MR#:  [id number] DATE OF BIRTH:  [birth year]  DATE OF ADMISSION:  06/11/2012 DATE OF DISCHARGE:  06/14/2012  PRESENTING COMPLAINT: Nausea, vomiting, not feeling well.   DISCHARGE DIAGNOSES:  1. Hypovolemic shock due to severe prerenal azotemia, now resolved.  2. Severe hyponatremia.  3. Metabolic acidosis due to renal failure.  4. Hyperkalemia.  5. Bipolar disorder.  6. History of ulcerative colitis status post total colectomy with ileostomy.  CONDITION ON DISCHARGE: Fair.   MEDICATIONS:  1. Seroquel 200 mg 1 tablet at bedtime.  2. Tylenol 500 mg t.i.d. p.r.n.   FOLLOW-UP: Follow-up with Dr. Rosanna Randy in 1 to 2 weeks.   CONSULTATIONS:  1. Nephrology consultation with Dr. Holley Raring   2. Psychiatry consultation with Dr. Weber Cooks   LABS AT DISCHARGE: Glucose 105, BUN 56, creatinine 1.61, sodium 133, potassium 3.9, chloride 100, bicarb 19, calcium 7.6, hemoglobin and hematocrit 9.5 and 27.6, white count 13.4. LFTs within normal limits. Albumin 2.2.   X-ray of the right foot and right hand no fracture or any other ortho abnormality noted.   Urine culture no growth in 18 to 24 hours. White count on admission was 20,000.   CT of the abdomen and pelvis without contrast is suggestive of bowel surgery suggestive of possible colectomy. Ostomy is present in the right lower quadrant.   Serum phosphorus of admission was 15.3. Magnesium 1.6. Lipase 411. Creatinine on admission was 21.9. Sodium was 106, potassium 6.5, bicarb 8. Lactic acid 2.2.   Chest x-ray left subclavian vein catheter. Mild basilar opacities secondary to atelectasis related to low lung volumes.   Blood cultures negative in 48 hours.   BRIEF SUMMARY OF HOSPITAL COURSE: Mr. Helbing is a 49 year old Caucasian gentleman with history of bipolar disorder and history of ulcerative colitis status post colectomy with ileostomy who came in with:  1. Hypovolemic shock. The patient presented with severe nausea  and vomiting along with not feeling well. He had not been eating and drinking well, had been overusing his Seroquel. He was found to have a sodium of 106 and creatinine of 21.3 at admission. He was started on IV fluids, slowly replaced with help of Nephrology, Dr. Holley Raring. Blood pressure remained stabilized. Urine output improved remarkably and IV fluids were discontinued at discharge.  2. Acute renal failure, prerenal, in the setting of poor p.o. intake, dehydration, and use of NSAIDs. The patient's sodium was improved from 106 to 133. He was initially started on D5 water which was then changed to normal saline. Nephrology input was appreciated. Urine output was 8000 since admission. His creatinine came down to 1.6.  3. Severe hyponatremia. Sodium improved from 106 to 133 after IV fluids with D5 water and normal saline.  4. Hyperkalemia secondary to acute renal failure. The patient received glucose, insulin, bicarbonate at admission. No EKG changes. Resolved prior to discharge.  5. Acute rhabdomyolysis. CK of 8400 down to 1500 secondary to dehydration, improved.  6. Questionable sepsis with elevated white count of 24,000 and lactic acid of 2.2, more likely appeared to be hypovolemic shock with severe ARF. Chest x-ray remained negative. Blood cultures negative and urine culture was negative. His empiric antibiotics were discontinued.  7. Ulcerative colitis with ileostomy, nothing acute on CT. GI no further recommendations.  8. History of bipolar disorder. The patient had been overusing his Seroquel per family members. He was seen by Dr. Weber Cooks who recommends the patient to be continued on his home dose of Seroquel 200 mg daily.  The patient does not follow with any psychiatrist according to him. However, the patient's mother reported he sees Dr. Marlowe Alt. The patient will follow-up with primary care physician, Dr. Rosanna Randy, as outpatient.      TIME SPENT: 40 minutes.    ____________________________ Hart Rochester Posey Pronto, MD sap:drc D: 06/15/2012 06:43:06 ET T: 06/16/2012 16:00:47 ET JOB#: [id number]  cc: Oakes Mccready A. Posey Pronto, MD, <Dictator> Richard L. Rosanna Randy, MD Ilda Basset MD ELECTRONICALLY SIGNED 06/17/2012 16:10

## 2015-01-11 NOTE — Consult Note (Signed)
PATIENT NAME:  Joshua Mays, Joshua Mays MR#:  [id number] DATE OF BIRTH:  Nov 04, 1965  DATE OF CONSULTATION:  06/11/2012  REFERRING PHYSICIAN:   CONSULTING PHYSICIAN:  Jill Side, MD  PRIMARY CARE PHYSICIAN: Dr. Rosanna Randy   REASON FOR CONSULTATION: Nausea, vomiting, dehydration, renal failure.   HISTORY OF PRESENT ILLNESS: 49 year old male with history of ulcerative colitis status post colectomy and ileostomy in 1990. He has no part of his colon left according to him and he has done well since surgery for ulcerative colitis. About an week or so ago patient started to have nausea and vomiting. He did not seek medical attention. This morning he felt extremely weak, couldn't walk, came to the Emergency Room where he was found to be hypotensive with a blood pressure of 65/34. He was severely hyponatremic and was in acute renal failure with a creatinine of 21. Patient seems to be a little bit better after hydration and treated with pressors. According to him, his ileostomy output is fairly normal and there has been no change in the amount of frequency of ileostomy output. His nausea and vomiting started about a week or so ago and was mostly nocturnal. Apparently he has been using large doses of ibuprofen recently. Denies significant abdominal pain. Denies any sick contacts.   PAST MEDICAL HISTORY:  1. History of depression and anxiety.  2. History of ulcerative colitis status post colectomy and ileostomy in 1990. He is not on any treatment for ulcerative colitis as he has no more colon left.  3. History of left shoulder surgery.  4. Tobacco abuse.  5. History of alcohol abuse. 6. Hypertension.   SOCIAL HISTORY: Patient is a heavy drinker, but apparently has not had any alcohol in the last week or so.   ALLERGIES: Ceclor, Cipro and Toradol.   FAMILY HISTORY: Quite unremarkable.   HOME MEDICATIONS: Lisinopril and Seroquel.   REVIEW OF SYSTEMS: Positive for severe weakness mostly in the legs, nausea,  vomiting, generalized weakness, dark ileostomy output which is fairly normal for him.   PHYSICAL EXAMINATION:  GENERAL: Fairly well built male who does not appear to be in any acute distress. Clinically in fact, he does not appear to be severely dehydrated either.   VITAL SIGNS: He does he is tachycardic with a heart rate of about 110, respirations 20 to 22, blood pressure 90/49.   SKIN: Grossly unremarkable.   NECK: Veins are flat.   LUNGS: Grossly clear to auscultation bilaterally with fair air entry and no added sounds.   CARDIOVASCULAR: Regular rate and rhythm.   ABDOMEN: Quite soft and benign. Ileostomy is in the right lower quadrant area and appears to be healthy. A small periumbilical hernia was noted. Bowel sounds are positive and quite regular. No significant hepatosplenomegaly was noted.   NEUROLOGIC: Examination was not done due to patient's overall poor condition.   LABORATORY, DIAGNOSTIC, AND RADIOLOGICAL DATA: CPK 8422, BUN 114, creatinine 21.98. Serum potassium was 6.5 on admission, CO2 was only 8. Liver enzymes are grossly unremarkable, although AST slightly elevated at 121, serum lipase 411. White cell count was 24,000 on admission, hemoglobin 10.5, hematocrit 29.9, platelet count 231 CT scan of abdomen and pelvis is quite unremarkable except for being consistent with history of prior cholecystectomy, questionable small accessory splenic tissue was noted. No nephrolithiasis or cholelithiasis and pancreas was reported to be fairly normal.   ASSESSMENT AND PLAN: Patient with nausea and vomiting. This may be secondary to renal failure and uremia itself or may have been caused  by some sort of gastroenteritis. This may have led to further deterioration in renal function as well as dehydration and hyponatremia. There is no evidence of ulcerative colitis exacerbation as patient has no colon, his ileostomy output has not changed and therefore ulcerative colitis would be very unlikely to  be responsible for his current clinical situation. Patient has severe acidosis, severe hyponatremia as well as severe acute renal failure. His ileostomy output seems to be okay. Abdominal examination is fairly benign. I agree with intravenous antibiotics and nephrology consultation. Clear liquid diet, advance as tolerated. Pancreatitis was another consideration as serum lipase is elevated but it is only minimally elevated and in renal failure mild to moderate elevation of serum lipase without acute pancreatitis is not uncommon. CT scan of the abdomen does not show significant pancreatic edema or inflammation. Will follow and make further recommendations.   ____________________________ Jill Side, MD si:cms D: 06/11/2012 18:50:56 ET T: 06/12/2012 12:06:16 ET JOB#: [id number]  cc: Jill Side, MD, <Dictator> Jill Side MD ELECTRONICALLY SIGNED 06/16/2012 12:36

## 2015-01-11 NOTE — Consult Note (Signed)
PATIENT NAME:  Joshua Mays, Joshua Mays MR#:  [id number] DATE OF BIRTH:  December 20, 1965  DATE OF CONSULTATION:  06/13/2012  REFERRING PHYSICIAN:   CONSULTING PHYSICIAN:  Gonzella Lex, MD  IDENTIFYING INFORMATION AND REASON FOR CONSULTATION: The patient is a 49 year old man currently in the hospital for treatment of hypovolemic shock and hyponatremia. Consult is because of concern about his history of depression and substance abuse and recent poor self care.   HISTORY OF PRESENT ILLNESS: Information obtained from the patient and from the chart. He presented to the hospital on 06/11/2012 in a state of rather extreme illness with very low blood pressure and remarkably abnormal lab studies. He has subsequently been treated first in the Critical Care Unit and then on the floor and is beginning to stabilize but is still hyponatremic. The patient tells me that he feels like he has not been able to get his life together for at least many months possibly more like years. He describes how he has not been able to work a job that he feels is appropriate for him since he was laid off a few years ago. Over the summer he began to feel like he just could not get anything right in his life. His history is a little vague but it sounds like he became more and more withdrawn. He tells me that sometime earlier this year he stopped taking care of his house, he stopped doing dishes and stopped cleaning because he felt like he no longer cared about himself. He admitted that he was taking excessive doses of Seroquel. He told me that he thought he was taking about twice as much as he was supposed to, but he was a little unclear about that. He denied to me that he actually had any thought about wanting to kill himself or wanting to die. He said that the feelings he was having were only a sort of depression and were more of a feeling of helplessness about his life. He did not report any clearcut psychotic symptoms. He becomes tearful talking  about this. He talks about how he does not think that he can ever get his life back on track again and he feels helpless, talks about how sad he is for having disappointed his mother by not taking care of himself. He appears to have probably at some point simply stopped eating and drinking all together. He may have been making himself excessively sick by taking high doses of Seroquel along with very large doses of ibuprofen every day for days on end.   PAST PSYCHIATRIC HISTORY: The patient has had several psychiatric hospitalizations in the past but not for several years. He has been diagnosed in the past as having borderline personality disorder and bipolar disorder type II. He used to see Dr. Octavia Heir for outpatient treatment but says that he has not been going to a doctor for psychiatric treatment in quite a while. He also used to have a therapist and thought that when he was seeing a therapist it was very helpful but has not been doing that. He was getting his Seroquel prescribed by his primary care doctor only. He does have a history of suicide attempt by overdose in the past. He has been treated with several medications, mostly mood stabilizers, in the past. He also believes that he has probably taken antidepressants. He thinks that the Seroquel was probably the most effective thing as far as keeping his mood stable and keeping him out of depression. He does have  a past history of substance abuse with an extensive history of alcohol abuse as well as abuse of multiple other drugs.   SOCIAL HISTORY: The patient has been living alone. Most recently he has not held a job. He does have two associates degrees and significant job skills but says he has not been able to keep a job in his skill set ever since the recession started. Recently he has been estranged from his family. He has no children. He is divorced. He does not seem to be in any kind of personal relationship currently.   REVIEW OF SYSTEMS: He is  still complaining of feeling very tired, also very sad, hopeless, and helpless but denies any suicidal ideation and denies any psychotic symptoms.   MENTAL STATUS EXAM: The patient was awake and alert in his hospital room when I came to talk with him. He was cooperative with the exam. He made good eye contact. He had normal psychomotor activity. Speech was normal in rate and tone and volume. Affect was tearful and dysphoric and anxious with full range of tone. Mood was stated as being disappointed in himself. Thoughts were a little bit tangential but nothing grossly bizarre or disorganized. No evidence of delusions. No evidence of paranoia. Denies hallucinations. Denies any suicidal or homicidal ideation and very clearly states that he does not want to die. He appears to be of at least normal intelligence. Specific cognitive testing not done but he is able to relate readily as though his immediate short term memory is intact. He seems to have vague memories about the events prior to hospitalization. Judgment and insight are improved but with chronic impairment.   ASSESSMENT: This is a 49 year old man with a history of long-standing depression, mood instability, and personality instability typical of borderline personality disorder. Currently there are no symptoms of psychosis and he denies any suicidal ideation. I do not think that the events leading up to his hospitalization were a conscious suicide attempt. I do not think that he is at high risk of suicide in the hospital, however, he is at chronic long-term risk of recurrent behaviors that are going to be dangerous to himself. Right now with his unstable lab studies and history of overdosing I do not think it would be necessary or prudent to restart any psychiatric medicine. He certainly does need long-term psychiatric treatment.   TREATMENT PLAN: No indication for transfer to the inpatient psychiatry ward. The patient primarily needs psychotherapy for the  long term. We will follow in the hospital. We will try and make sure that there is appropriate follow-up arranged for outpatient therapy once he leaves the hospital. If he medically stabilizes and we can come to a safe understanding about his medication usage might restart a mood stabilizer but would hold off on that for now.   DIAGNOSIS PRINCIPLE AND PRIMARY:   AXIS I: Bipolar disorder type II.   SECONDARY DIAGNOSES:   AXIS I: History of polysubstance dependence.   AXIS II: Borderline personality disorder.   AXIS III: Acute renal insufficiency, hyponatremia, hypovolemic shock, and rhabdomyolysis.   AXIS IV: Severe from just generally poor functioning and poor self care and poor resources.   AXIS V: Functioning at time of evaluation 34.  ____________________________ Gonzella Lex, MD jtc:slb D: 06/13/2012 22:57:38 ET T: 06/14/2012 11:35:19 ET JOB#: [id number]  cc: Gonzella Lex, MD, <Dictator> Gonzella Lex MD ELECTRONICALLY SIGNED 06/14/2012 12:04

## 2015-03-01 ENCOUNTER — Ambulatory Visit: Payer: Self-pay | Admitting: Family Medicine

## 2015-04-25 ENCOUNTER — Telehealth: Payer: Self-pay | Admitting: Family Medicine

## 2015-04-25 NOTE — Telephone Encounter (Signed)
Pt called states a form was faxed last week from [facility] requesting colostomy supplies.  Pt is asking if this fax has been rec'd?   CB#[phone]/MW

## 2015-04-25 NOTE — Telephone Encounter (Signed)
Dr. Darnell Level do you know anything about this?  ED

## 2015-05-12 ENCOUNTER — Emergency Department
Admission: EM | Admit: 2015-05-12 | Discharge: 2015-05-12 | Disposition: A | Discharge status: Home / Self Care | Payer: Medicaid Other | Attending: Emergency Medicine | Admitting: Emergency Medicine

## 2015-05-12 DIAGNOSIS — I10 Essential (primary) hypertension: Secondary | ICD-10-CM | POA: Diagnosis not present

## 2015-05-12 DIAGNOSIS — M109 Gout, unspecified: Secondary | ICD-10-CM | POA: Diagnosis not present

## 2015-05-12 DIAGNOSIS — E876 Hypokalemia: Secondary | ICD-10-CM | POA: Diagnosis not present

## 2015-05-12 DIAGNOSIS — M79671 Pain in right foot: Secondary | ICD-10-CM | POA: Diagnosis present

## 2015-05-12 HISTORY — DX: Ulcerative colitis, unspecified, without complications: K51.90

## 2015-05-12 HISTORY — DX: Essential (primary) hypertension: I10

## 2015-05-12 LAB — CBC WITH DIFFERENTIAL/PLATELET
BASOS ABS: 0 10*3/uL (ref 0–0.1)
Basophils Relative: 0 %
EOS PCT: 1 %
Eosinophils Absolute: 0.1 10*3/uL (ref 0–0.7)
HCT: 44.2 % (ref 40.0–52.0)
Hemoglobin: 14.8 g/dL (ref 13.0–18.0)
Lymphocytes Relative: 11 %
Lymphs Abs: 1 10*3/uL (ref 1.0–3.6)
MCH: 29.8 pg (ref 26.0–34.0)
MCHC: 33.4 g/dL (ref 32.0–36.0)
MCV: 89.1 fL (ref 80.0–100.0)
Monocytes Absolute: 0.6 10*3/uL (ref 0.2–1.0)
Monocytes Relative: 7 %
Neutro Abs: 7.7 10*3/uL — ABNORMAL HIGH (ref 1.4–6.5)
Neutrophils Relative %: 81 %
Platelets: 227 10*3/uL (ref 150–440)
RBC: 4.97 MIL/uL (ref 4.40–5.90)
RDW: 15 % — ABNORMAL HIGH (ref 11.5–14.5)
WBC: 9.6 10*3/uL (ref 3.8–10.6)

## 2015-05-12 LAB — BASIC METABOLIC PANEL
Anion gap: 10 (ref 5–15)
BUN: 14 mg/dL (ref 6–20)
CHLORIDE: 107 mmol/L (ref 101–111)
CO2: 20 mmol/L — AB (ref 22–32)
CREATININE: 1.22 mg/dL (ref 0.61–1.24)
Calcium: 9.3 mg/dL (ref 8.9–10.3)
GFR calc non Af Amer: >60 mL/min (ref >60)
Glucose, Bld: 141 mg/dL — ABNORMAL HIGH (ref 65–99)
POTASSIUM: 3.3 mmol/L — AB (ref 3.5–5.1)
SODIUM: 137 mmol/L (ref 135–145)

## 2015-05-12 LAB — URIC ACID: URIC ACID, SERUM: 10.4 mg/dL — AB (ref 4.4–7.6)

## 2015-05-12 MED ORDER — POTASSIUM CHLORIDE CRYS ER 20 MEQ PO TBCR
20.0000 meq | EXTENDED_RELEASE_TABLET | Freq: Once | ORAL | Status: AC
  Administered 2015-05-12: 20 meq via ORAL
  Filled 2015-05-12: qty 1

## 2015-05-12 MED ORDER — POTASSIUM CHLORIDE ER 20 MEQ PO TBCR: 10.0000 meq | EXTENDED_RELEASE_TABLET | Freq: Two times a day (BID) | ORAL | Status: DC

## 2015-05-12 MED ORDER — DICLOFENAC SODIUM 75 MG PO TBEC: 75.0000 mg | DELAYED_RELEASE_TABLET | Freq: Two times a day (BID) | ORAL | Status: DC

## 2015-05-12 MED ORDER — DICLOFENAC SODIUM 75 MG PO TBEC
75.0000 mg | DELAYED_RELEASE_TABLET | Freq: Two times a day (BID) | ORAL | Status: DC
Start: 2015-05-12 — End: 2015-05-12
  Administered 2015-05-12: 75 mg via ORAL
  Filled 2015-05-12 (×2): qty 1

## 2015-05-12 NOTE — ED Notes (Signed)
Pt reports to ED w/ c/o R foot pain.  Pt reports pain has been ongoing for past 2 M but has gotten worse in last 3 days.

## 2015-05-12 NOTE — Discharge Instructions (Signed)
Gout °Gout is an inflammatory arthritis caused by a buildup of uric acid crystals in the joints. Uric acid is a chemical that is normally present in the blood. When the level of uric acid in the blood is too high it can form crystals that deposit in your joints and tissues. This causes joint redness, soreness, and swelling (inflammation). Repeat attacks are common. Over time, uric acid crystals can form into masses (tophi) near a joint, destroying bone and causing disfigurement. Gout is treatable and often preventable. °CAUSES  °The disease begins with elevated levels of uric acid in the blood. Uric acid is produced by your body when it breaks down a naturally found substance called purines. Certain foods you eat, such as meats and fish, contain high amounts of purines. Causes of an elevated uric acid level include: °· Being passed down from parent to child (heredity). °· Diseases that cause increased uric acid production (such as obesity, psoriasis, and certain cancers). °· Excessive alcohol use. °· Diet, especially diets rich in meat and seafood. °· Medicines, including certain cancer-fighting medicines (chemotherapy), water pills (diuretics), and aspirin. °· Chronic kidney disease. The kidneys are no longer able to remove uric acid well. °· Problems with metabolism. °Conditions strongly associated with gout include: °· Obesity. °· High blood pressure. °· High cholesterol. °· Diabetes. °Not everyone with elevated uric acid levels gets gout. It is not understood why some people get gout and others do not. Surgery, joint injury, and eating too much of certain foods are some of the factors that can lead to gout attacks. °SYMPTOMS  °· An attack of gout comes on quickly. It causes intense pain with redness, swelling, and warmth in a joint. °· Fever can occur. °· Often, only one joint is involved. Certain joints are more commonly involved: °· Base of the big toe. °· Knee. °· Ankle. °· Wrist. °· Finger. °Without  treatment, an attack usually goes away in a few days to weeks. Between attacks, you usually will not have symptoms, which is different from many other forms of arthritis. °DIAGNOSIS  °Your caregiver will suspect gout based on your symptoms and exam. In some cases, tests may be recommended. The tests may include: °· Blood tests. °· Urine tests. °· X-rays. °· Joint fluid exam. This exam requires a needle to remove fluid from the joint (arthrocentesis). Using a microscope, gout is confirmed when uric acid crystals are seen in the joint fluid. °TREATMENT  °There are two phases to gout treatment: treating the sudden onset (acute) attack and preventing attacks (prophylaxis). °· Treatment of an Acute Attack. °· Medicines are used. These include anti-inflammatory medicines or steroid medicines. °· An injection of steroid medicine into the affected joint is sometimes necessary. °· The painful joint is rested. Movement can worsen the arthritis. °· You may use warm or cold treatments on painful joints, depending which works best for you. °· Treatment to Prevent Attacks. °· If you suffer from frequent gout attacks, your caregiver may advise preventive medicine. These medicines are started after the acute attack subsides. These medicines either help your kidneys eliminate uric acid from your body or decrease your uric acid production. You may need to stay on these medicines for a very long time. °· The early phase of treatment with preventive medicine can be associated with an increase in acute gout attacks. For this reason, during the first few months of treatment, your caregiver may also advise you to take medicines usually used for acute gout treatment. Be sure you   understand your caregiver's directions. Your caregiver may make several adjustments to your medicine dose before these medicines are effective.  Discuss dietary treatment with your caregiver or dietitian. Alcohol and drinks high in sugar and fructose and foods  such as meat, poultry, and seafood can increase uric acid levels. Your caregiver or dietitian can advise you on drinks and foods that should be limited. HOME CARE INSTRUCTIONS   Do not take aspirin to relieve pain. This raises uric acid levels.  Only take over-the-counter or prescription medicines for pain, discomfort, or fever as directed by your caregiver.  Rest the joint as much as possible. When in bed, keep sheets and blankets off painful areas.  Keep the affected joint raised (elevated).  Apply warm or cold treatments to painful joints. Use of warm or cold treatments depends on which works best for you.  Use crutches if the painful joint is in your leg.  Drink enough fluids to keep your urine clear or pale yellow. This helps your body get rid of uric acid. Limit alcohol, sugary drinks, and fructose drinks.  Follow your dietary instructions. Pay careful attention to the amount of protein you eat. Your daily diet should emphasize fruits, vegetables, whole grains, and fat-free or low-fat milk products. Discuss the use of coffee, vitamin C, and cherries with your caregiver or dietitian. These may be helpful in lowering uric acid levels.  Maintain a healthy body weight. SEEK MEDICAL CARE IF:   You develop diarrhea, vomiting, or any side effects from medicines.  You do not feel better in 24 hours, or you are getting worse. SEEK IMMEDIATE MEDICAL CARE IF:   Your joint becomes suddenly more tender, and you have chills or a fever. MAKE SURE YOU:   Understand these instructions.  Will watch your condition.  Will get help right away if you are not doing well or get worse. Document Released: 09/07/2000 Document Revised: 01/25/2014 Document Reviewed: 04/23/2012 [facility] Patient Information 2015 [place], [place]. This information is not intended to replace advice given to you by your health care provider. Make sure you discuss any questions you have with your health care  provider.   Hypokalemia Hypokalemia means that the amount of potassium in the blood is lower than normal.Potassium is a chemical, called an electrolyte, that helps regulate the amount of fluid in the body. It also stimulates muscle contraction and helps nerves function properly.Most of the body's potassium is inside of cells, and only a very small amount is in the blood. Because the amount in the blood is so small, minor changes can be life-threatening. CAUSES  Antibiotics.  Diarrhea or vomiting.  Using laxatives too much, which can cause diarrhea.  Chronic kidney disease.  Water pills (diuretics).  Eating disorders (bulimia).  Low magnesium level.  Sweating a lot. SIGNS AND SYMPTOMS  Weakness.  Constipation.  Fatigue.  Muscle cramps.  Mental confusion.  Skipped heartbeats or irregular heartbeat (palpitations).  Tingling or numbness. DIAGNOSIS  Your health care provider can diagnose hypokalemia with blood tests. In addition to checking your potassium level, your health care provider may also check other lab tests. TREATMENT Hypokalemia can be treated with potassium supplements taken by mouth or adjustments in your current medicines. If your potassium level is very low, you may need to get potassium through a vein (IV) and be monitored in the hospital. A diet high in potassium is also helpful. Foods high in potassium are:  Nuts, such as peanuts and pistachios.  Seeds, such as sunflower seeds and  pumpkin seeds.  Peas, lentils, and lima beans.  Whole grain and bran cereals and breads.  Fresh fruit and vegetables, such as apricots, avocado, bananas, cantaloupe, kiwi, oranges, tomatoes, asparagus, and potatoes.  Orange and tomato juices.  Red meats.  Fruit yogurt. HOME CARE INSTRUCTIONS  Take all medicines as prescribed by your health care provider.  Maintain a healthy diet by including nutritious food, such as fruits, vegetables, nuts, whole grains, and lean  meats.  If you are taking a laxative, be sure to follow the directions on the label. SEEK MEDICAL CARE IF:  Your weakness gets worse.  You feel your heart pounding or racing.  You are vomiting or having diarrhea.  You are diabetic and having trouble keeping your blood glucose in the normal range. SEEK IMMEDIATE MEDICAL CARE IF:  You have chest pain, shortness of breath, or dizziness.  You are vomiting or having diarrhea for more than 2 days.  You faint. MAKE SURE YOU:   Understand these instructions.  Will watch your condition.  Will get help right away if you are not doing well or get worse. Document Released: 09/10/2005 Document Revised: 07/01/2013 Document Reviewed: 03/13/2013 [facility] Patient Information 2015 [place], [place]. This information is not intended to replace advice given to you by your health care provider. Make sure you discuss any questions you have with your health care provider.  Your labs confirm high uric acid, associated with gout, and low potassium, which may not cause any symptoms. Take the prescription meds as directed and follow-up with your provider for ongoing management.

## 2015-05-12 NOTE — ED Provider Notes (Signed)
[facility] Emergency Department Provider Note ____________________________________________  Time seen: 1100  I have reviewed the triage vital signs and the nursing notes.  HISTORY  Chief Complaint  Foot Pain  HPI Joshua Mays is a 49 y.o. male reports to the ED for evaluation of pain to the right foot that has been persistent for the last 2 months. He denies any known injury or trauma to the foot. He notes over the last 3 days the pain has increased. He localizes pain primarily to the metatarsal head of the first toe. He notes some redness in that area and increased pain with walking. He denies a history of gouty arthritis, but reports a history of acute renal failure.The patient does for Seroquel XR and 5 mg oxycodone as needed for pain.  Past Medical History  Diagnosis Date  . Hypertension   . 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 Ulcerative colitis    There are no active problems to display for this patient.  Past Surgical History  Procedure Laterality Date  . Hand surgery     Current Outpatient Rx  Name  Route  Sig  Dispense  Refill  . diclofenac (VOLTAREN) 75 MG EC tablet   Oral   Take 1 tablet (75 mg total) by mouth 2 (two) times daily.   30 tablet   0   . potassium chloride 20 MEQ TBCR   Oral   Take 10 mEq by mouth 2 (two) times daily.   20 tablet   0    Allergies Ciprofloxacin  No family history on file.  Social History Social History  Substance Use Topics  . Smoking status: Never Smoker   . Smokeless tobacco: None  . Alcohol Use: No   Review of Systems  Constitutional: Negative for fever. Eyes: Negative for visual changes. ENT: Negative for sore throat. Cardiovascular: Negative for chest pain. Respiratory: Negative for shortness of breath. Gastrointestinal: Negative for abdominal pain, vomiting and diarrhea. Genitourinary: Negative for dysuria. Musculoskeletal: Negative for back pain. Right foot pain as above Skin: Negative  for rash. Neurological: Negative for headaches, focal weakness or numbness. ____________________________________________  PHYSICAL EXAM:  VITAL SIGNS: ED Triage Vitals  Enc Vitals Group     BP 05/12/15 1034 142/89 mmHg     Pulse Rate 05/12/15 1034 105     Resp 05/12/15 1034 16     Temp 05/12/15 1034 97.6 F (36.4 C)     Temp Source 05/12/15 1034 Oral     SpO2 05/12/15 1034 99 %     Weight 05/12/15 1034 198 lb (89.812 kg)     Height 05/12/15 1034 5\' 11"  (1.803 m)     Head Cir --      Peak Flow --      Pain Score 05/12/15 1035 4     Pain Loc --      Pain Edu? --      Excl. in [place]? --    Constitutional: Alert and oriented. Well appearing and in no distress. Eyes: Conjunctivae are normal. PERRL. Normal extraocular movements. ENT   Head: Normocephalic and atraumatic.   Nose: No congestion/rhinnorhea.   Mouth/Throat: Mucous membranes are moist.   Neck: Supple. No thyromegaly. Hematological/Lymphatic/Immunilogical: No cervical lymphadenopathy. Cardiovascular: Normal rate, regular rhythm. Normal distal pulses. Respiratory: Normal respiratory effort. No wheezes/rales/rhonchi. Gastrointestinal: Soft and nontender. No distention. Musculoskeletal: Nontender with normal range of motion in all extremities. Right foot with erythema noted to the 1st MTP. Normal foot and  ankle exam otherwise.  Neurologic:  Normal gait without ataxia. Normal speech and language. No gross focal neurologic deficits are appreciated. Skin:  Skin is warm, dry and intact. No rash noted. Psychiatric: Mood and affect are normal. Patient exhibits appropriate insight and judgment. ____________________________________________    LABS (pertinent positives/negatives) Labs Reviewed  URIC ACID - Abnormal; Notable for the following:    Uric Acid, Serum 10.4 (*)    All other components within normal limits  CBC WITH DIFFERENTIAL/PLATELET - Abnormal; Notable for the following:    RDW 15.0 (*)    Neutro Abs  7.7 (*)    All other components within normal limits  BASIC METABOLIC PANEL - Abnormal; Notable for the following:    Potassium 3.3 (*)    CO2 20 (*)    Glucose, Bld 141 (*)    All other components within normal limits  ___________________________________________  PROCEDURES  Potassium chloride 20 mEq PO ____________________________________________  INITIAL IMPRESSION / ASSESSMENT AND PLAN / ED COURSE  Acute right foot pain due to gout. Suggest treatment with Diclofenac BID #30. Continue home pain meds and follow-up with pain management provider for narcotic regimen. See primary provider for uric acid and potassium management.  ____________________________________________  FINAL CLINICAL IMPRESSION(S) / ED DIAGNOSES  Final diagnoses:  Gout of big toe  Hypokalemia     Melvenia Needles, PA-C 05/12/15 1219  Lavonia Drafts, MD 05/12/15 [phone]

## 2015-05-18 ENCOUNTER — Ambulatory Visit (INDEPENDENT_AMBULATORY_CARE_PROVIDER_SITE_OTHER): Payer: Medicaid Other | Admitting: Family Medicine

## 2015-05-18 ENCOUNTER — Telehealth: Payer: Self-pay | Admitting: Family Medicine

## 2015-05-18 VITALS — BP 110/78 | HR 88 | Temp 97.9°F | Resp 16 | Wt 201.0 lb

## 2015-05-18 DIAGNOSIS — N529 Male erectile dysfunction, unspecified: Secondary | ICD-10-CM | POA: Insufficient documentation

## 2015-05-18 DIAGNOSIS — C4499 Other specified malignant neoplasm of skin, unspecified: Secondary | ICD-10-CM | POA: Insufficient documentation

## 2015-05-18 DIAGNOSIS — M1000 Idiopathic gout, unspecified site: Secondary | ICD-10-CM | POA: Diagnosis not present

## 2015-05-18 DIAGNOSIS — K5190 Ulcerative colitis, unspecified, without complications: Secondary | ICD-10-CM | POA: Insufficient documentation

## 2015-05-18 DIAGNOSIS — F319 Bipolar disorder, unspecified: Secondary | ICD-10-CM | POA: Insufficient documentation

## 2015-05-18 DIAGNOSIS — Z8739 Personal history of other diseases of the musculoskeletal system and connective tissue: Secondary | ICD-10-CM | POA: Insufficient documentation

## 2015-05-18 DIAGNOSIS — F1911 Other psychoactive substance abuse, in remission: Secondary | ICD-10-CM | POA: Insufficient documentation

## 2015-05-18 DIAGNOSIS — E538 Deficiency of other specified B group vitamins: Secondary | ICD-10-CM | POA: Insufficient documentation

## 2015-05-18 DIAGNOSIS — K429 Umbilical hernia without obstruction or gangrene: Secondary | ICD-10-CM

## 2015-05-18 DIAGNOSIS — L920 Granuloma annulare: Secondary | ICD-10-CM | POA: Insufficient documentation

## 2015-05-18 DIAGNOSIS — K5010 Crohn's disease of large intestine without complications: Secondary | ICD-10-CM | POA: Insufficient documentation

## 2015-05-18 DIAGNOSIS — Z859 Personal history of malignant neoplasm, unspecified: Secondary | ICD-10-CM | POA: Insufficient documentation

## 2015-05-18 DIAGNOSIS — F329 Major depressive disorder, single episode, unspecified: Secondary | ICD-10-CM | POA: Insufficient documentation

## 2015-05-18 DIAGNOSIS — G8929 Other chronic pain: Secondary | ICD-10-CM | POA: Insufficient documentation

## 2015-05-18 DIAGNOSIS — Z87448 Personal history of other diseases of urinary system: Secondary | ICD-10-CM | POA: Insufficient documentation

## 2015-05-18 DIAGNOSIS — F32A Depression, unspecified: Secondary | ICD-10-CM | POA: Insufficient documentation

## 2015-05-18 DIAGNOSIS — I10 Essential (primary) hypertension: Secondary | ICD-10-CM | POA: Insufficient documentation

## 2015-05-18 NOTE — Telephone Encounter (Signed)
Noted-aa 

## 2015-05-18 NOTE — Telephone Encounter (Signed)
Pt was discharged from [facility] ER on 05/12/2015 for gout in his right foot/toe.  I have scheduled for a follow up today/MW

## 2015-05-27 NOTE — Progress Notes (Signed)
Patient ID: Joshua Mays, male   DOB: 06-09-1966, 49 y.o.   MRN: [id number]    Subjective:  HPI  Patient is here to follow up after ER visit for Gout and Hypokalemia.  Patient was seen in ER on August 18th for joint pain and swelling. He was found to have gout and also potassium issues. His Uric Acid was elevated at the hospital. He was given Voltaren and  Potasium RXs but patient did not take anything extra until he see [place]. Patient is fully aware of his potential substance abuse issues and he tries to avoid anything might cause him problems with addiction. Prior to Admission medications   Medication Sig Start Date End Date Taking? Authorizing Provider  clonazePAM (KLONOPIN) 0.5 MG tablet Take by mouth.    Historical Provider, MD  diclofenac (VOLTAREN) 75 MG EC tablet Take 1 tablet (75 mg total) by mouth 2 (two) times daily. 05/12/15   Jenise V Bacon Menshew, PA-C  Oxycodone HCl 10 MG TABS Take by mouth. 11/27/13   Historical Provider, MD  potassium chloride 20 MEQ TBCR Take 10 mEq by mouth 2 (two) times daily. 05/12/15   Jenise V Bacon Menshew, PA-C  QUEtiapine (SEROQUEL) 400 MG tablet Take by mouth.    Historical Provider, MD  sildenafil (VIAGRA) 100 MG tablet Take by mouth. 02/11/15   Historical Provider, MD    Patient Active Problem List   Diagnosis Date Noted  . 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 Chronic pain 05/18/2015  . CC (Crohn's colitis) 05/18/2015  . Clinical depression 05/18/2015  . Failure of erection 05/18/2015  . 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 Personal history of urinary disorder 05/18/2015  . H/O malignant neoplasm 05/18/2015  . H/O: substance abuse 05/18/2015  . Personal history of arthritis 05/18/2015  . BP (high blood pressure) 05/18/2015  . 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 Colitis gravis 05/18/2015  . B12 deficiency 05/18/2015  . 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  Hypertension   . 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 Ulcerative colitis     Social History   Social History  . Marital Status: Single    Spouse Name: N/A  . Number of Children: N/A  . Years of Education: N/A   Occupational History  . Not on file.   Social History Main Topics  . Smoking status: Never Smoker   . Smokeless tobacco: Not on file  . Alcohol Use: No  . Drug Use: Not on file  . Sexual Activity: Not on file   Other Topics Concern  . Not on file   Social History Narrative    Allergies  Allergen Reactions  . Ciprofloxacin   . Cefaclor Rash    Review of Systems  Constitutional: Negative.   Eyes: Negative.   Respiratory: Negative.   Cardiovascular: Negative.   Gastrointestinal: Negative.   Musculoskeletal: Positive for joint pain.  Neurological: Negative.   Psychiatric/Behavioral: Negative.      There is no immunization history on file for this patient. Objective:  BP 110/78 mmHg  Pulse 88  Temp(Src) 97.9 F (36.6 C)  Resp 16  Wt 201 lb (91.173 kg)  Physical Exam  Constitutional: He is oriented to person, place, and time and well-developed, well-nourished, and in no distress.  HENT:  Head: Normocephalic and atraumatic.  Right Ear: External ear normal.  Left Ear: External ear normal.  Nose: Nose normal.  Eyes:  Conjunctivae are normal.  Neck: Neck supple.  Cardiovascular: Normal rate, regular rhythm and normal heart sounds.   Pulmonary/Chest: Effort normal.  Abdominal: Soft.  Ostomy bag clean  Neurological: He is alert and oriented to person, place, and time.  Skin: Skin is warm and dry.  Psychiatric: Mood, memory, affect and judgment normal.    Lab Results  Component Value Date   WBC 9.6 05/12/2015   HGB 14.8 05/12/2015   HCT 44.2 05/12/2015   PLT 227 05/12/2015   GLUCOSE 141* 05/12/2015   CHOL 159 07/29/2013   TRIG 207* 07/29/2013   HDL 69 07/29/2013   LDLCALC 49 07/29/2013   TSH 0.77 10/14/2014   PSA 1.5 12/17/2013   INR 1.1 06/11/2012      CMP     Component Value Date/Time   NA 137 05/12/2015 1124   NA 137 10/14/2014   NA 133* 04/26/2013 1029   K 3.3* 05/12/2015 1124   K 3.9 04/26/2013 1029   CL 107 05/12/2015 1124   CL 101 04/26/2013 1029   CO2 20* 05/12/2015 1124   CO2 24 04/26/2013 1029   GLUCOSE 141* 05/12/2015 1124   GLUCOSE 110* 04/26/2013 1029   BUN 14 05/12/2015 1124   BUN 13 10/14/2014   BUN 13 04/26/2013 1029   CREATININE 1.22 05/12/2015 1124   CREATININE 1.2 10/14/2014   CREATININE 1.07 04/26/2013 1029   CALCIUM 9.3 05/12/2015 1124   CALCIUM 9.7 04/26/2013 1029   PROT 8.7* 04/26/2013 1029   ALBUMIN 3.7 04/26/2013 1029   AST 40 10/14/2014   AST 26 04/26/2013 1029   ALT 49* 10/14/2014   ALT 29 04/26/2013 1029   ALKPHOS 107 10/14/2014   ALKPHOS 136 04/26/2013 1029   BILITOT 1.5* 04/26/2013 1029   GFRNONAA >60 05/12/2015 1124   GFRNONAA >60 04/26/2013 1029   GFRAA >60 05/12/2015 1124   GFRAA >60 04/26/2013 1029    Assessment and Plan :  Gout Discussed dietary changes. Discussed cherry juice extract. Try colchicine daily when necessary recheck 2-3 weeks Sarcoma of the right hand Followed by [facility] oncology Bipolar disorder Followed by psychiatry   [facility] 05/27/2015 11:16 AM

## 2015-05-31 NOTE — Telephone Encounter (Signed)
Pt states that at his last office visit he was supposed to get a referral to see one of the surgeons at [facility] for a hernia.He would like this set up ASAP but there is no referral in [facility]

## 2015-06-01 ENCOUNTER — Encounter: Payer: Self-pay | Admitting: *Deleted

## 2015-06-01 DIAGNOSIS — K429 Umbilical hernia without obstruction or gangrene: Secondary | ICD-10-CM | POA: Insufficient documentation

## 2015-06-01 NOTE — Telephone Encounter (Signed)
Routine referral

## 2015-06-01 NOTE — Telephone Encounter (Signed)
Patient has had multiple lab dominant surgeries and has an ostomy bag that is chronic. He appears to have an umbilical hernia.

## 2015-06-02 DIAGNOSIS — Z0000 Encounter for general adult medical examination without abnormal findings: Secondary | ICD-10-CM

## 2015-06-03 ENCOUNTER — Telehealth: Payer: Self-pay | Admitting: Family Medicine

## 2015-06-03 NOTE — Telephone Encounter (Signed)
They called needing a diagnosis for his colostomy supplies.  Fax [phone].

## 2015-06-03 NOTE — Telephone Encounter (Signed)
Please review-aa 

## 2015-06-07 NOTE — Telephone Encounter (Signed)
  crohns disease

## 2015-06-07 NOTE — Telephone Encounter (Signed)
Done-aa 

## 2015-06-07 NOTE — Telephone Encounter (Signed)
i faxed over the dx code and what supplies are needed. This is the 4th time trying to get the same thing figured out-aa

## 2015-06-07 NOTE — Telephone Encounter (Signed)
Joshua Mays called again to request a call back.  CB#[phone] Ext [phone]/MW

## 2015-06-07 NOTE — Telephone Encounter (Signed)
Adonis Huguenin from [facility] called back regarding this earlier message.  She still needs to know if his supplies are for colostomy or ileostomy.  Call back [phone]  ThanksTeri.

## 2015-06-07 NOTE — Telephone Encounter (Signed)
Spoke Adonis Huguenin and advised as below=aa

## 2015-06-08 ENCOUNTER — Encounter: Payer: Self-pay | Admitting: General Surgery

## 2015-06-08 ENCOUNTER — Ambulatory Visit (INDEPENDENT_AMBULATORY_CARE_PROVIDER_SITE_OTHER): Payer: Medicaid Other | Admitting: General Surgery

## 2015-06-08 VITALS — BP 126/62 | HR 72 | Resp 14 | Ht 72.0 in | Wt 202.0 lb

## 2015-06-08 DIAGNOSIS — K432 Incisional hernia without obstruction or gangrene: Secondary | ICD-10-CM | POA: Diagnosis not present

## 2015-06-08 DIAGNOSIS — K429 Umbilical hernia without obstruction or gangrene: Secondary | ICD-10-CM

## 2015-06-08 NOTE — Patient Instructions (Addendum)
Hernia A hernia occurs when an internal organ pushes out through a weak spot in the abdominal wall. Hernias most commonly occur in the groin and around the navel. Hernias often can be pushed back into place (reduced). Most hernias tend to get worse over time. Some abdominal hernias can get stuck in the opening (irreducible or incarcerated hernia) and cannot be reduced. An irreducible abdominal hernia which is tightly squeezed into the opening is at risk for impaired blood supply (strangulated hernia). A strangulated hernia is a medical emergency. Because of the risk for an irreducible or strangulated hernia, surgery may be recommended to repair a hernia. CAUSES   Heavy lifting.  Prolonged coughing.  Straining to have a bowel movement.  A cut (incision) made during an abdominal surgery. HOME CARE INSTRUCTIONS   Bed rest is not required. You may continue your normal activities.  Avoid lifting more than 10 pounds (4.5 kg) or straining.  Cough gently. If you are a smoker it is best to stop. Even the best hernia repair can break down with the continual strain of coughing. Even if you do not have your hernia repaired, a cough will continue to aggravate the problem.  Do not wear anything tight over your hernia. Do not try to keep it in with an outside bandage or truss. These can damage abdominal contents if they are trapped within the hernia sac.  Eat a normal diet.  Avoid constipation. Straining over long periods of time will increase hernia size and encourage breakdown of repairs. If you cannot do this with diet alone, stool softeners may be used. SEEK IMMEDIATE MEDICAL CARE IF:   You have a fever.  You develop increasing abdominal pain.  You feel nauseous or vomit.  Your hernia is stuck outside the abdomen, looks discolored, feels hard, or is tender.  You have any changes in your bowel habits or in the hernia that are unusual for you.  You have increased pain or swelling around the  hernia.  You cannot push the hernia back in place by applying gentle pressure while lying down. MAKE SURE YOU:   Understand these instructions.  Will watch your condition.  Will get help right away if you are not doing well or get worse. Document Released: 09/10/2005 Document Revised: 12/03/2011 Document Reviewed: 04/29/2008 [facility] Patient Information 2015 [place], [place]. This information is not intended to replace advice given to you by your health care provider. Make sure you discuss any questions you have with your health care provider.  Patient's surgery has been scheduled for 06-13-15 at [facility].

## 2015-06-08 NOTE — Progress Notes (Signed)
Patient ID: LINK BURGESON, male   DOB: [birth year], 49 y.o.   MRN: [id number]  Chief Complaint  Patient presents with  . Umbilical Hernia    HPI OLMAN YONO is a 49 y.o. male here today for an evaluation of a recurrent umbilical hernia. Patient had previous umbilical hernia surgery about 15 years ago at [facility] in 2001. Patient reports he was moving his 100 pound TV about a month and noticed a new bulge above the umbilicus bulge and associated pain pain. He had been aware of a small bulge at the umbilicus prior to this event, but had been stable for several years. He now appreciates a second bulge superior to this. Denies any eating problems. 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 There was an attempt at completion of a ileal conduit with mucosectomy of the rectum, with the patient had persistent sepsis, pronounced weight loss and subsequent proctectomy and re-creation of a permanent ileostomy. Patient used to work as an Chief Financial Officer.    The patient presently making use of oxycodone for pain in the right hand status post resection of a sarcoma and radiation treatment. HPI  Past Medical History  Diagnosis Date  . Ulcerative colitis 1990  . Hypertension 2011  . 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 Bipolar 1 disorder   . Depression   . History of substance abuse     Past Surgical History  Procedure Laterality Date  . Hand surgery  2011  . Ileostomy  1990  . Appendectomy  1990  . Colectomy  1990  . Total colectomy  1990  . Umbilical hernia repair  2001  . Shoulder surgery  2001    Family History  Problem Relation Age of Onset  . Hyperlipidemia Mother   . Diabetes Father   . Vascular Disease Father   . Alcohol abuse Father   . Depression Father   . Hyperlipidemia Brother   . CVA Maternal Grandfather     Social History Social History  Substance Use Topics  . Smoking status: Current Every Day  Smoker -- 0.50 packs/day    Types: Cigarettes  . Smokeless tobacco: Never Used  . Alcohol Use: No    Allergies  Allergen Reactions  . Ciprofloxacin   . Cefaclor Rash    Current Outpatient Prescriptions  Medication Sig Dispense Refill  . clonazePAM (KLONOPIN) 0.5 MG tablet Take by mouth.    Marland Kitchen ibuprofen (ADVIL,MOTRIN) 200 MG tablet Take 200 mg by mouth every 6 (six) hours as needed.    . Oxycodone HCl 10 MG TABS Take by mouth.    . potassium chloride 20 MEQ TBCR Take 10 mEq by mouth 2 (two) times daily. 20 tablet 0  . QUEtiapine (SEROQUEL) 400 MG tablet Take by mouth.     No current facility-administered medications for this visit.    Review of Systems Review of Systems  Constitutional: Negative.   Respiratory: Negative.   Cardiovascular: Negative.     Blood pressure 126/62, pulse 72, resp. rate 14, height 6' (1.829 m), weight 202 lb (91.627 kg).  Physical Exam Physical Exam  Constitutional: He is oriented to person, place, and time. He appears well-developed and well-nourished.  HENT:  Mouth/Throat: Oropharynx is clear and moist.  Eyes: Conjunctivae are normal. No scleral icterus.  Neck: Neck supple.  Cardiovascular: Normal rate, regular rhythm and normal heart sounds.   Pulmonary/Chest: Effort normal and breath sounds  normal.  Abdominal: Soft. A hernia (umbilical and epigastric ) is present.    Lymphadenopathy:    He has no cervical adenopathy.  Neurological: He is alert and oriented to person, place, and time.  Skin: Skin is warm and dry.  Psychiatric: His behavior is normal.    Data Reviewed PCP notes of 05/27/2015.  Colchicine seen to be used for acute gout flares. No chronic anti-inflammatory recommended. History of substance abuse noted.  Recent ED evaluation for gout and hypokalemia. (Uric acid 10.4, potassium 3.3)   Assessment    Ventral and umbilical hernia.  Recent history hypokalemia, on potassium supplements.    Plan         Hernia  precautions and incarceration were discussed with the patient. If they develop symptoms of an incarcerated hernia, they were encouraged to seek prompt medical attention.  I have recommended repair of the hernia as an outpatient procedure in the future. The potential role for mesh utilization if the defect areas larger than presently evident was reviewed. We'll likely try to avoid mesh if possible. With the patient's 10 previous intra-abdominal procedures, I don't think he be a great candidate for laparoscopic repair.. The risk of infection was reviewed. The role of prosthetic mesh to minimize the risk of recurrence was reviewed.  Plan to schedule hernia surgery.   Patient's surgery has been scheduled for 06-13-15 at [facility].  PCP: Miguel Aschoff  Robert Bellow 06/09/2015, 6:41 AM

## 2015-06-09 ENCOUNTER — Encounter: Payer: Self-pay | Admitting: General Surgery

## 2015-06-09 DIAGNOSIS — K432 Incisional hernia without obstruction or gangrene: Secondary | ICD-10-CM | POA: Insufficient documentation

## 2015-06-09 NOTE — H&P (Signed)
Patient ID: Joshua Mays, male   DOB: 23-Jun-1966, 49 y.o.   MRN: [id number]    Chief Complaint   Patient presents with   .  Umbilical Hernia      HPI Joshua Mays is a 49 y.o. male here today for an evaluation of a recurrent umbilical hernia. Patient had previous umbilical hernia surgery about 15 years ago at [facility] in 2001. Patient reports he was moving his 100 pound TV about a month and noticed a new bulge above the umbilicus bulge and associated pain pain. He had been aware of a small bulge at the umbilicus prior to this event, but had been stable for several years. He now appreciates a second bulge superior to this. Denies any eating problems. 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 There was an attempt at completion of a ileal conduit with mucosectomy of the rectum, with the patient had persistent sepsis, pronounced weight loss and subsequent proctectomy and re-creation of a permanent ileostomy. Patient used to work as an Chief Financial Officer.     The patient presently making use of oxycodone for pain in the right hand status post resection of a sarcoma and radiation treatment. HPI    Past Medical History   Diagnosis  Date   .  Ulcerative colitis  1990   .  Hypertension  2011   .  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  Bipolar 1 disorder     .  Depression     .  History of substance abuse         Past Surgical History   Procedure  Laterality  Date   .  Hand surgery    2011   .  Ileostomy    1990   .  Appendectomy    1990   .  Colectomy    1990   .  Total colectomy    1990   .  Umbilical hernia repair    2001   .  Shoulder surgery    2001       Family History   Problem  Relation  Age of Onset   .  Hyperlipidemia  Mother     .  Diabetes  Father     .  Vascular Disease  Father     .  Alcohol abuse  Father     .  Depression  Father     .  Hyperlipidemia  Brother     .  CVA  Maternal Grandfather         Social History Social History   Substance Use Topics   .  Smoking status:  Current Every Day Smoker -- 0.50 packs/day       Types:  Cigarettes   .  Smokeless tobacco:  Never Used   .  Alcohol Use:  No       Allergies   Allergen  Reactions   .  Ciprofloxacin     .  Cefaclor  Rash       Current Outpatient Prescriptions   Medication  Sig  Dispense  Refill   .  clonazePAM (KLONOPIN) 0.5 MG tablet  Take by mouth.       Marland Kitchen  ibuprofen (ADVIL,MOTRIN) 200 MG tablet  Take 200 mg by mouth every 6 (six) hours as needed.       .  Oxycodone HCl 10  MG TABS  Take by mouth.       .  potassium chloride 20 MEQ TBCR  Take 10 mEq by mouth 2 (two) times daily.  20 tablet  0   .  QUEtiapine (SEROQUEL) 400 MG tablet  Take by mouth.           No current facility-administered medications for this visit.      Review of Systems Review of Systems  Constitutional: Negative.   Respiratory: Negative.   Cardiovascular: Negative.     Blood pressure 126/62, pulse 72, resp. rate 14, height 6' (1.829 m), weight 202 lb (91.627 kg).   Physical Exam Physical Exam  Constitutional: He is oriented to person, place, and time. He appears well-developed and well-nourished.  HENT:   Mouth/Throat: Oropharynx is clear and moist.  Eyes: Conjunctivae are normal. No scleral icterus.  Neck: Neck supple.  Cardiovascular: Normal rate, regular rhythm and normal heart sounds.   Pulmonary/Chest: Effort normal and breath sounds normal.  Abdominal: Soft. A hernia (umbilical and epigastric ) is present.   graphic Lymphadenopathy:    He has no cervical adenopathy.  Neurological: He is alert and oriented to person, place, and time.  Skin: Skin is warm and dry.  Psychiatric: His behavior is normal.    Data Reviewed PCP notes of 05/27/2015.   Colchicine seen to be used for acute gout flares. No chronic anti-inflammatory recommended. History of substance abuse noted.   Recent ED evaluation for gout and  hypokalemia. (Uric acid 10.4, potassium 3.3)     Assessment Ventral and umbilical hernia.   Recent history hypokalemia, on potassium supplements.   Plan     Hernia precautions and incarceration were discussed with the patient. If they develop symptoms of an incarcerated hernia, they were encouraged to seek prompt medical attention.   I have recommended repair of the hernia as an outpatient procedure in the future. The potential role for mesh utilization if the defect areas larger than presently evident was reviewed. We'll likely try to avoid mesh if possible. With the patient's 10 previous intra-abdominal procedures, I don't think he be a great candidate for laparoscopic repair.. The risk of infection was reviewed. The role of prosthetic mesh to minimize the risk of recurrence was reviewed.   Plan to schedule hernia surgery.    Patient's surgery has been scheduled for 06-13-15 at [facility].   PCP: Miguel Aschoff   Joshua Mays 06/09/2015, 6:41 AM

## 2015-06-10 ENCOUNTER — Other Ambulatory Visit: Payer: Medicaid Other

## 2015-06-10 ENCOUNTER — Encounter: Payer: Self-pay | Admitting: *Deleted

## 2015-06-10 NOTE — Pre-Procedure Instructions (Signed)
PT STATED THAT HE NEVER GOT HIS POTASSIUM RX FILLED LAST MONTH WHEN HE HAD A 3.3 K+. PT STATES THAT HE IS TRYING TO EAT FOODS THAT HAVE K+ IN THEM. I TOLD PT THAT HE REALLY DOES NEED TO GET HIS RX FILLED AND TAKE HIS K+ THIS WEEKEND IN ORDER FOR HIS SURGERY NOT TO GET CANCELLED IN CASE ITS STILL LOW.  DR BYRNETT HAS ORDERED A POTASSIUM SERUM THE DAY OF SURGERY TO MAKE SURE IT HAS COME UP. PT VERBALIZED HE WOULD GET THE RX FILLED.

## 2015-06-10 NOTE — Patient Instructions (Signed)
  Your procedure is scheduled on: 06-13-15 Report to [facility] To find out your arrival time please call [phone] between 1PM - 3PM on 06-10-15  Remember: Instructions that are not followed completely may result in serious medical risk, up to and including death, or upon the discretion of your surgeon and anesthesiologist your surgery may need to be rescheduled.    __X__ 1. Do not eat food or drink liquids after midnight. No gum chewing or hard candies.     __X__ 2. No Alcohol for 24 hours before or after surgery.   ____ 3. Bring all medications with you on the day of surgery if instructed.    ____ 4. Notify your doctor if there is any change in your medical condition     (cold, fever, infections).     Do not wear jewelry, make-up, hairpins, clips or nail polish.  Do not wear lotions, powders, or perfumes. You may wear deodorant.  Do not shave 48 hours prior to surgery. Men may shave face and neck.  Do not bring valuables to the hospital.    [facility] is not responsible for any belongings or valuables.               Contacts, dentures or bridgework may not be worn into surgery.  Leave your suitcase in the car. After surgery it may be brought to your room.  For patients admitted to the hospital, discharge time is determined by your  treatment team.   Patients discharged the day of surgery will not be allowed to drive home.   Please read over the following fact sheets that you were given:      ____ Take these medicines the morning of surgery with A SIP OF WATER:    1. MAY TAKE OXYCODONE IF NEEDED WITH SMALL SIP OF WATER  2.   3.   4.  5.  6.  ____ Fleet Enema (as directed)   ____ Use CHG Soap as directed  ____ Use inhalers on the day of surgery  ____ Stop metformin 2 days prior to surgery    ____ Take 1/2 of usual insulin dose the night before surgery and none on the morning of surgery.   ____ Stop Coumadin/Plavix/aspirin-N/A  _X___  Stop Anti-inflammatories-STOP IBUPROFEN NOW-NO NSAIDS OR ASA PRODUCTS-OXYCODONE OK TO CONTINUE   ____ Stop supplements until after surgery.    ____ Bring C-Pap to the hospital.

## 2015-06-13 ENCOUNTER — Ambulatory Visit: Payer: Medicaid Other | Admitting: *Deleted

## 2015-06-13 ENCOUNTER — Encounter (HOSPITAL_COMMUNITY): Payer: Medicaid Other | Admitting: General Surgery

## 2015-06-13 ENCOUNTER — Encounter: Payer: Self-pay | Admitting: *Deleted

## 2015-06-13 ENCOUNTER — Encounter
Admission: RE | Disposition: A | Discharge status: Home / Self Care | Payer: Self-pay | Source: Ambulatory Visit | Attending: General Surgery

## 2015-06-13 ENCOUNTER — Inpatient Hospital Stay
Admission: RE | Admit: 2015-06-13 | Discharge: 2015-06-15 | DRG: 355 | Disposition: A | Discharge status: Home / Self Care | Payer: Medicaid Other | Source: Ambulatory Visit | Attending: General Surgery | Admitting: General Surgery

## 2015-06-13 DIAGNOSIS — Z8249 Family history of ischemic heart disease and other diseases of the circulatory system: Secondary | ICD-10-CM | POA: Diagnosis not present

## 2015-06-13 DIAGNOSIS — K439 Ventral hernia without obstruction or gangrene: Principal | ICD-10-CM | POA: Diagnosis present

## 2015-06-13 DIAGNOSIS — K436 Other and unspecified ventral hernia with obstruction, without gangrene: Secondary | ICD-10-CM | POA: Diagnosis not present

## 2015-06-13 DIAGNOSIS — F319 Bipolar disorder, unspecified: Secondary | ICD-10-CM | POA: Diagnosis present

## 2015-06-13 DIAGNOSIS — I10 Essential (primary) hypertension: Secondary | ICD-10-CM | POA: Diagnosis present

## 2015-06-13 DIAGNOSIS — K429 Umbilical hernia without obstruction or gangrene: Secondary | ICD-10-CM | POA: Diagnosis present

## 2015-06-13 DIAGNOSIS — G8918 Other acute postprocedural pain: Secondary | ICD-10-CM | POA: Diagnosis present

## 2015-06-13 DIAGNOSIS — Z833 Family history of diabetes mellitus: Secondary | ICD-10-CM | POA: Diagnosis not present

## 2015-06-13 DIAGNOSIS — Z880 Allergy status to penicillin: Secondary | ICD-10-CM | POA: Diagnosis not present

## 2015-06-13 DIAGNOSIS — Z932 Ileostomy status: Secondary | ICD-10-CM

## 2015-06-13 DIAGNOSIS — Z823 Family history of stroke: Secondary | ICD-10-CM

## 2015-06-13 DIAGNOSIS — K432 Incisional hernia without obstruction or gangrene: Secondary | ICD-10-CM

## 2015-06-13 DIAGNOSIS — F17210 Nicotine dependence, cigarettes, uncomplicated: Secondary | ICD-10-CM | POA: Diagnosis present

## 2015-06-13 HISTORY — PX: VENTRAL HERNIA REPAIR: SHX424

## 2015-06-13 HISTORY — DX: Gastro-esophageal reflux disease without esophagitis: K21.9

## 2015-06-13 HISTORY — DX: Cardiac arrhythmia, unspecified: I49.9

## 2015-06-13 HISTORY — DX: Gout, unspecified: M10.9

## 2015-06-13 LAB — CBC
HCT: 42.6 % (ref 40.0–52.0)
Hemoglobin: 14.1 g/dL (ref 13.0–18.0)
MCH: 30.4 pg (ref 26.0–34.0)
MCHC: 33 g/dL (ref 32.0–36.0)
MCV: 92.1 fL (ref 80.0–100.0)
PLATELETS: 271 10*3/uL (ref 150–440)
RBC: 4.62 MIL/uL (ref 4.40–5.90)
RDW: 14.5 % (ref 11.5–14.5)
WBC: 9.1 10*3/uL (ref 3.8–10.6)

## 2015-06-13 LAB — POTASSIUM: POTASSIUM: 4.3 mmol/L (ref 3.5–5.1)

## 2015-06-13 LAB — URINE DRUG SCREEN, QUALITATIVE (ARMC ONLY)
Amphetamines, Ur Screen: NOT DETECTED
BARBITURATES, UR SCREEN: NOT DETECTED
Benzodiazepine, Ur Scrn: NOT DETECTED
CANNABINOID 50 NG, UR ~~LOC~~: POSITIVE — AB
Cocaine Metabolite,Ur ~~LOC~~: NOT DETECTED
MDMA (ECSTASY) UR SCREEN: NOT DETECTED
Methadone Scn, Ur: NOT DETECTED
Opiate, Ur Screen: NOT DETECTED
PHENCYCLIDINE (PCP) UR S: NOT DETECTED
TRICYCLIC, UR SCREEN: POSITIVE — AB

## 2015-06-13 LAB — CREATININE, SERUM
CREATININE: 0.99 mg/dL (ref 0.61–1.24)
GFR calc non Af Amer: >60 mL/min (ref >60)

## 2015-06-13 LAB — URIC ACID: URIC ACID, SERUM: 7.7 mg/dL — AB (ref 4.4–7.6)

## 2015-06-13 SURGERY — REPAIR, HERNIA, VENTRAL
Anesthesia: General | Wound class: Clean Contaminated

## 2015-06-13 MED ORDER — ZOLPIDEM TARTRATE 5 MG PO TABS
5.0000 mg | ORAL_TABLET | Freq: Every evening | ORAL | Status: DC | PRN
  Administered 2015-06-13: 5 mg via ORAL
  Filled 2015-06-13: qty 1

## 2015-06-13 MED ORDER — FENTANYL CITRATE (PF) 100 MCG/2ML IJ SOLN
INTRAMUSCULAR | Status: DC | PRN
  Administered 2015-06-13 (×4): 50 ug via INTRAVENOUS
  Administered 2015-06-13: 150 ug via INTRAVENOUS
  Administered 2015-06-13 (×2): 50 ug via INTRAVENOUS

## 2015-06-13 MED ORDER — FAMOTIDINE 20 MG PO TABS
ORAL_TABLET | ORAL | Status: AC
  Administered 2015-06-13: 20 mg via ORAL
  Filled 2015-06-13: qty 1

## 2015-06-13 MED ORDER — BUPIVACAINE HCL (PF) 0.5 % IJ SOLN
INTRAMUSCULAR | Status: DC | PRN
  Administered 2015-06-13: 30 mL

## 2015-06-13 MED ORDER — LABETALOL HCL 5 MG/ML IV SOLN
INTRAVENOUS | Status: AC
  Administered 2015-06-13: 5 mg via INTRAVENOUS
  Filled 2015-06-13: qty 4

## 2015-06-13 MED ORDER — SUGAMMADEX SODIUM 200 MG/2ML IV SOLN
INTRAVENOUS | Status: DC | PRN
  Administered 2015-06-13: 200 mg via INTRAVENOUS

## 2015-06-13 MED ORDER — ONDANSETRON HCL 4 MG PO TABS: 4.0000 mg | ORAL_TABLET | Freq: Four times a day (QID) | ORAL | Status: DC | PRN

## 2015-06-13 MED ORDER — HYDROMORPHONE HCL 1 MG/ML IJ SOLN
0.2500 mg | INTRAMUSCULAR | Status: DC | PRN
  Administered 2015-06-13 (×4): 0.5 mg via INTRAVENOUS

## 2015-06-13 MED ORDER — DIPHENHYDRAMINE HCL 50 MG/ML IJ SOLN: 12.5000 mg | Freq: Four times a day (QID) | INTRAMUSCULAR | Status: DC | PRN

## 2015-06-13 MED ORDER — DEXAMETHASONE SODIUM PHOSPHATE 4 MG/ML IJ SOLN
INTRAMUSCULAR | Status: DC | PRN
  Administered 2015-06-13: 10 mg via INTRAVENOUS

## 2015-06-13 MED ORDER — POTASSIUM CHLORIDE ER 10 MEQ PO TBCR
10.0000 meq | EXTENDED_RELEASE_TABLET | Freq: Two times a day (BID) | ORAL | Status: DC
  Administered 2015-06-13 – 2015-06-15 (×4): 10 meq via ORAL
  Filled 2015-06-13 (×9): qty 1

## 2015-06-13 MED ORDER — ONDANSETRON HCL 4 MG/2ML IJ SOLN: 4.0000 mg | Freq: Four times a day (QID) | INTRAMUSCULAR | Status: DC | PRN

## 2015-06-13 MED ORDER — ENOXAPARIN SODIUM 40 MG/0.4ML ~~LOC~~ SOLN
40.0000 mg | SUBCUTANEOUS | Status: DC
  Administered 2015-06-14 – 2015-06-15 (×2): 40 mg via SUBCUTANEOUS
  Filled 2015-06-13 (×2): qty 0.4

## 2015-06-13 MED ORDER — ONDANSETRON HCL 4 MG/2ML IJ SOLN
INTRAMUSCULAR | Status: DC | PRN
  Administered 2015-06-13 (×2): 4 mg via INTRAVENOUS

## 2015-06-13 MED ORDER — ROCURONIUM BROMIDE 100 MG/10ML IV SOLN
INTRAVENOUS | Status: DC | PRN
  Administered 2015-06-13 (×3): 10 mg via INTRAVENOUS
  Administered 2015-06-13 (×2): 20 mg via INTRAVENOUS
  Administered 2015-06-13 (×2): 10 mg via INTRAVENOUS

## 2015-06-13 MED ORDER — QUETIAPINE FUMARATE 100 MG PO TABS
400.0000 mg | ORAL_TABLET | Freq: Every day | ORAL | Status: DC
  Administered 2015-06-13 – 2015-06-14 (×2): 400 mg via ORAL
  Filled 2015-06-13 (×2): qty 4

## 2015-06-13 MED ORDER — DEXAMETHASONE SODIUM PHOSPHATE 4 MG/ML IJ SOLN: 8.0000 mg | Freq: Once | INTRAMUSCULAR | Status: DC | PRN

## 2015-06-13 MED ORDER — MORPHINE SULFATE 1 MG/ML IV SOLN
INTRAVENOUS | Status: DC
  Administered 2015-06-13: 19:00:00 via INTRAVENOUS
  Administered 2015-06-13: 7.5 mg via INTRAVENOUS
  Administered 2015-06-13: 17:00:00 via INTRAVENOUS
  Administered 2015-06-13: 15 mg via INTRAVENOUS
  Administered 2015-06-14: 10.5 mg via INTRAVENOUS
  Administered 2015-06-14: 4.5 mg via INTRAVENOUS
  Filled 2015-06-13 (×3): qty 25

## 2015-06-13 MED ORDER — KETOROLAC TROMETHAMINE 30 MG/ML IJ SOLN
30.0000 mg | Freq: Three times a day (TID) | INTRAMUSCULAR | Status: DC
  Administered 2015-06-13 – 2015-06-15 (×5): 30 mg via INTRAVENOUS
  Filled 2015-06-13 (×5): qty 1

## 2015-06-13 MED ORDER — VANCOMYCIN HCL IN DEXTROSE 1-5 GM/200ML-% IV SOLN
INTRAVENOUS | Status: AC
  Administered 2015-06-13: 1 g
  Filled 2015-06-13: qty 200

## 2015-06-13 MED ORDER — FAMOTIDINE 20 MG PO TABS
20.0000 mg | ORAL_TABLET | Freq: Once | ORAL | Status: AC
  Administered 2015-06-13: 20 mg via ORAL

## 2015-06-13 MED ORDER — LACTATED RINGERS IV SOLN
INTRAVENOUS | Status: DC
  Administered 2015-06-13 (×4): via INTRAVENOUS

## 2015-06-13 MED ORDER — ACETAMINOPHEN 10 MG/ML IV SOLN
1000.0000 mg | Freq: Four times a day (QID) | INTRAVENOUS | Status: DC
  Administered 2015-06-13 – 2015-06-14 (×3): 1000 mg via INTRAVENOUS
  Filled 2015-06-13 (×4): qty 100

## 2015-06-13 MED ORDER — MAGNESIUM HYDROXIDE 400 MG/5ML PO SUSP: 30.0000 mL | Freq: Every day | ORAL | Status: DC | PRN

## 2015-06-13 MED ORDER — ACETAMINOPHEN 10 MG/ML IV SOLN
INTRAVENOUS | Status: DC | PRN
Start: 2015-06-13 — End: 2015-06-13
  Administered 2015-06-13: 1000 mg via INTRAVENOUS

## 2015-06-13 MED ORDER — BISACODYL 10 MG RE SUPP: 10.0000 mg | Freq: Every day | RECTAL | Status: DC | PRN

## 2015-06-13 MED ORDER — SUCCINYLCHOLINE CHLORIDE 20 MG/ML IJ SOLN
INTRAMUSCULAR | Status: DC | PRN
  Administered 2015-06-13: 120 mg via INTRAVENOUS

## 2015-06-13 MED ORDER — PNEUMOCOCCAL VAC POLYVALENT 25 MCG/0.5ML IJ INJ
0.5000 mL | INJECTION | INTRAMUSCULAR | Status: DC
  Filled 2015-06-13: qty 0.5

## 2015-06-13 MED ORDER — BUPIVACAINE HCL (PF) 0.5 % IJ SOLN
INTRAMUSCULAR | Status: AC
  Filled 2015-06-13: qty 30

## 2015-06-13 MED ORDER — NALOXONE HCL 0.4 MG/ML IJ SOLN: 0.4000 mg | INTRAMUSCULAR | Status: DC | PRN

## 2015-06-13 MED ORDER — KETAMINE HCL 50 MG/ML IJ SOLN
INTRAMUSCULAR | Status: DC | PRN
  Administered 2015-06-13: 50 mg via INTRAMUSCULAR

## 2015-06-13 MED ORDER — LIDOCAINE HCL (CARDIAC) 20 MG/ML IV SOLN
INTRAVENOUS | Status: DC | PRN
Start: 2015-06-13 — End: 2015-06-13
  Administered 2015-06-13: 60 mg via INTRAVENOUS
  Administered 2015-06-13: 100 mg via INTRAVENOUS

## 2015-06-13 MED ORDER — SODIUM CHLORIDE 0.9 % IJ SOLN: 9.0000 mL | INTRAMUSCULAR | Status: DC | PRN

## 2015-06-13 MED ORDER — KETOROLAC TROMETHAMINE 30 MG/ML IJ SOLN
INTRAMUSCULAR | Status: DC | PRN
  Administered 2015-06-13: 30 mg via INTRAVENOUS

## 2015-06-13 MED ORDER — HYDROMORPHONE HCL 1 MG/ML IJ SOLN
INTRAMUSCULAR | Status: AC
  Administered 2015-06-13: 0.5 mg via INTRAVENOUS
  Filled 2015-06-13: qty 1

## 2015-06-13 MED ORDER — ACETAMINOPHEN 10 MG/ML IV SOLN
INTRAVENOUS | Status: AC
  Filled 2015-06-13: qty 100

## 2015-06-13 MED ORDER — HYDROMORPHONE HCL 1 MG/ML IJ SOLN
0.2500 mg | INTRAMUSCULAR | Status: DC | PRN
  Administered 2015-06-13 (×2): 0.5 mg via INTRAVENOUS

## 2015-06-13 MED ORDER — DEXTROSE IN LACTATED RINGERS 5 % IV SOLN
INTRAVENOUS | Status: DC
  Administered 2015-06-13 – 2015-06-14 (×4): via INTRAVENOUS

## 2015-06-13 MED ORDER — MIDAZOLAM HCL 2 MG/2ML IJ SOLN
INTRAMUSCULAR | Status: DC | PRN
  Administered 2015-06-13: 2 mg via INTRAVENOUS
  Administered 2015-06-13: 1 mg via INTRAVENOUS

## 2015-06-13 MED ORDER — VANCOMYCIN HCL IN DEXTROSE 1-5 GM/200ML-% IV SOLN
1000.0000 mg | INTRAVENOUS | Status: AC
  Administered 2015-06-13: 1000 mg via INTRAVENOUS

## 2015-06-13 MED ORDER — LABETALOL HCL 5 MG/ML IV SOLN
5.0000 mg | INTRAVENOUS | Status: AC | PRN
  Administered 2015-06-13 (×4): 5 mg via INTRAVENOUS

## 2015-06-13 MED ORDER — PROPOFOL 10 MG/ML IV BOLUS
INTRAVENOUS | Status: DC | PRN
  Administered 2015-06-13: 200 mg via INTRAVENOUS

## 2015-06-13 MED ORDER — PROMETHAZINE HCL 25 MG/ML IJ SOLN: 12.5000 mg | Freq: Four times a day (QID) | INTRAMUSCULAR | Status: DC | PRN

## 2015-06-13 MED ORDER — DIPHENHYDRAMINE HCL 12.5 MG/5ML PO ELIX
12.5000 mg | ORAL_SOLUTION | Freq: Four times a day (QID) | ORAL | Status: DC | PRN
  Administered 2015-06-13: 12.5 mg via ORAL
  Filled 2015-06-13: qty 5

## 2015-06-13 MED ORDER — CLONAZEPAM 0.5 MG PO TABS
0.5000 mg | ORAL_TABLET | Freq: Two times a day (BID) | ORAL | Status: DC
  Administered 2015-06-13 – 2015-06-15 (×4): 0.5 mg via ORAL
  Filled 2015-06-13 (×4): qty 1

## 2015-06-13 SURGICAL SUPPLY — 50 items
BARRIER SKIN 2 1/4 (WOUND CARE) ×2 IMPLANT
BARRIER SKIN 2 1/4INCH (WOUND CARE) ×1
BENZOIN TINCTURE PRP APPL 2/3 (GAUZE/BANDAGES/DRESSINGS) ×3 IMPLANT
BINDER ABDOMINAL 12 ML 46-62 (SOFTGOODS) ×3 IMPLANT
BLADE SURG 15 STRL SS SAFETY (BLADE) ×3 IMPLANT
BULB RESERV EVAC DRAIN JP 100C (MISCELLANEOUS) ×3 IMPLANT
CANISTER SUCT 1200ML W/VALVE (MISCELLANEOUS) ×3 IMPLANT
CATH TRAY 16F METER LATEX (MISCELLANEOUS) IMPLANT
CHLORAPREP W/TINT 26ML (MISCELLANEOUS) ×3 IMPLANT
CLOSURE WOUND 1/2 X4 (GAUZE/BANDAGES/DRESSINGS) ×1
DRAIN CHANNEL JP 15F RND 16 (MISCELLANEOUS) ×3 IMPLANT
DRAPE CHEST BREAST 77X106 FENE (MISCELLANEOUS) IMPLANT
DRAPE INCISE IOBAN 66X45 STRL (DRAPES) ×3 IMPLANT
DRAPE LAPAROTOMY 100X77 ABD (DRAPES) ×3 IMPLANT
DRESSING TELFA 4X3 1S ST N-ADH (GAUZE/BANDAGES/DRESSINGS) ×3 IMPLANT
DRSG GAUZE PETRO 6X36 STRIP ST (GAUZE/BANDAGES/DRESSINGS) ×3 IMPLANT
DRSG OPSITE POSTOP 4X10 (GAUZE/BANDAGES/DRESSINGS) ×3 IMPLANT
DRSG TEGADERM 4X4.75 (GAUZE/BANDAGES/DRESSINGS) ×3 IMPLANT
DRSG TELFA 3X8 NADH (GAUZE/BANDAGES/DRESSINGS) IMPLANT
GAUZE SPONGE 4X4 12PLY STRL (GAUZE/BANDAGES/DRESSINGS) IMPLANT
GLOVE BIO SURGEON STRL SZ7.5 (GLOVE) ×6 IMPLANT
GLOVE INDICATOR 8.0 STRL GRN (GLOVE) ×6 IMPLANT
GOWN STRL REUS W/ TWL LRG LVL3 (GOWN DISPOSABLE) ×2 IMPLANT
GOWN STRL REUS W/TWL LRG LVL3 (GOWN DISPOSABLE) ×4
KIT RM TURNOVER STRD PROC AR (KITS) ×3 IMPLANT
LABEL OR SOLS (LABEL) IMPLANT
MESH PROLITE ULTRA 6X6 (Mesh General) ×3 IMPLANT
NDL SAFETY 22GX1.5 (NEEDLE) ×3 IMPLANT
NEEDLE HYPO 25X1 1.5 SAFETY (NEEDLE) ×3 IMPLANT
NS IRRIG 500ML POUR BTL (IV SOLUTION) ×6 IMPLANT
PACK BASIN MINOR ARMC (MISCELLANEOUS) ×3 IMPLANT
PAD GROUND ADULT SPLIT (MISCELLANEOUS) ×3 IMPLANT
POUCH DRAIN  2 1/4 MED RED 181 (OSTOMY) ×3 IMPLANT
RETAINER VISCERA MED (MISCELLANEOUS) ×3 IMPLANT
SPONGE LAP 18X18 5 PK (GAUZE/BANDAGES/DRESSINGS) ×6 IMPLANT
STAPLER SKIN PROX 35W (STAPLE) ×3 IMPLANT
STRIP CLOSURE SKIN 1/2X4 (GAUZE/BANDAGES/DRESSINGS) ×2 IMPLANT
SUT ETHILON 3-0 FS-10 30 BLK (SUTURE) ×3
SUT MAXON ABS #0 GS21 30IN (SUTURE) ×9 IMPLANT
SUT PROLENE 0 CT 1 30 (SUTURE) ×15 IMPLANT
SUT SURGILON 0 BLK (SUTURE) ×6 IMPLANT
SUT VIC AB 0 CT1 36 (SUTURE) ×15 IMPLANT
SUT VIC AB 2-0 BRD 54 (SUTURE) ×9 IMPLANT
SUT VIC AB 3-0 SH 27 (SUTURE) ×6
SUT VIC AB 3-0 SH 27X BRD (SUTURE) ×3 IMPLANT
SUT VIC AB 4-0 FS2 27 (SUTURE) ×3 IMPLANT
SUT VICRYL+ 3-0 144IN (SUTURE) ×3 IMPLANT
SUTURE EHLN 3-0 FS-10 30 BLK (SUTURE) ×1 IMPLANT
SYR 3ML LL SCALE MARK (SYRINGE) IMPLANT
SYR CONTROL 10ML (SYRINGE) ×3 IMPLANT

## 2015-06-13 NOTE — Anesthesia Preprocedure Evaluation (Signed)
Anesthesia Evaluation  Patient identified by MRN, date of birth, ID band Patient awake    Reviewed: Allergy & Precautions, NPO status , Patient's Chart, lab work & pertinent test results  Airway Mallampati: III  TM Distance: >3 FB Neck ROM: Full    Dental  (+) Teeth Intact   Pulmonary Current Smoker,    breath sounds clear to auscultation       Cardiovascular Exercise Tolerance: Good hypertension,  Rhythm:Regular Rate:Normal     Neuro/Psych    GI/Hepatic Denies problems with GERD today, is fasted.   Endo/Other    Renal/GU      Musculoskeletal   Abdominal (+) + obese,  Abdomen: soft.    Peds  Hematology   Anesthesia Other Findings   Reproductive/Obstetrics                             Anesthesia Physical Anesthesia Plan  ASA: III  Anesthesia Plan: General   Post-op Pain Management:    Induction: Intravenous  Airway Management Planned: Oral ETT  Additional Equipment:   Intra-op Plan:   Post-operative Plan: Extubation in OR  Informed Consent: I have reviewed the patients History and Physical, chart, labs and discussed the procedure including the risks, benefits and alternatives for the proposed anesthesia with the patient or authorized representative who has indicated his/her understanding and acceptance.     Plan Discussed with: CRNA  Anesthesia Plan Comments:         Anesthesia Quick Evaluation

## 2015-06-13 NOTE — Op Note (Signed)
Preoperative diagnosis: Ventral and umbilical hernia.  Postoperative diagnosis: Multiple ventral abdominal hernias.  Operative procedure: Ventral hernia repair with component separation, retrorectus atrium mesh placement.  Operating surgeon: Hervey Ard, M.D.  Anesthesia: Gen. endotracheal, Marcaine 0.5% plain, 30 mL local infiltration.  Estimated blood loss: 150 mL.  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 He's developed to fascial defects one at the umbilicus and one just cephalad to this location. These become increasingly symptomatic and it was elected to proceed to repair.  The patient had pneumatic compression stockings for DVT prevention. Due to a penicillin allergy he received vancomycin prior to surgery.  Operative note with the patient under adequate general endotracheal anesthesia the abdomen was prepped with chlor prep after a 6 x 36" Vaseline gauze was gently packed into the ileostomy. An Ioban II drape was then placed. Marcaine was infiltrated along the anticipated incision on both sides. The skin was incised sharply and the remaining dissection completed with electrocautery. The 2 known hernia defects were identified, most coming through small slits in the fascia. As exploration continued both cephalad and caudad on multiple additional fascial defects were identified. The eventual length of the incision was approximately 15 cm at which point the fascia appeared intact. After gently entering the peritoneum the omentum was dissected free from the anterior abdominal wall. Inferiorly multiple non-and dilated loops of small bowel were encountered and sharp dissection was used to free these adhesions from the anterior abdominal wall. The ileostomy stoma was well lateral and  the rectus fascia was undisturbed. Considering the multiple defects mesh repair was felt to be appropriate. The posterior rectus sheath was opened on each side and the posterior rectus sheath mobilized free from the overlying rectus muscle. This was on eventful on the left side of the abdomen but the right inferior epigastric vessel was divided on the right side resulting in all of the blood loss noted above. This was obscured the controlled with 3-0 Vicryls figure-of-eight sutures. The posterior sheath was freed to the edge of the rectus fascia on the left and on the middle and superior portions on the right. The inferior portion included the area of the stoma and this was not dissected.  The mesh was smoothed in the space behind the rectus muscle. It was anchored superiorly and inferiorly with multiple 0 proline figure-of-eight sutures. Transfacial sutures were then fashioned with 0 Maxon. 3 on the left, 2 on the right. The mesh was smoothed into position. These were then subsequently tied with good linear orientation. There was a slight ridge when the fascia was approximated in the midline and this was incorporated with interrupted 0 proline figure-of-eight sutures during the anterior rectus sheath/midline fascia closure. Prior to fascial closure a Blake drain was placed below the rectus fascia and brought out through a stab wound incision in the left upper quadrant. This was anchored in place with 3-0 nylon. The adipose layer was approximated with multiple layers of 0 Vicryls suture. Skin was closed with staples. Transfacial suture sites were closed with benzoin and Steri-Strip. A new ileostomy plate was placed and a honeycomb dressing placed over the wound.  The patient was very nicely extubated without coughing and an abdominal binder placed.  The patient was subsequently taken to recovery room in stable condition.

## 2015-06-13 NOTE — H&P (Signed)
No change in clinical history or exam.  For ventral hernia repair.  

## 2015-06-13 NOTE — Transfer of Care (Signed)
Immediate Anesthesia Transfer of Care Note  Patient: Joshua Mays  Procedure(s) Performed: Procedure(s): HERNIA REPAIR VENTRAL ADULT (N/A)  Patient Location: PACU  Anesthesia Type:General  Level of Consciousness: sedated  Airway & Oxygen Therapy: Patient Spontanous Breathing and Patient connected to face mask oxygen  Post-op Assessment: Report given to RN and Post -op Vital signs reviewed and stable  Post vital signs: Reviewed and stable  Last Vitals:  Filed Vitals:   06/13/15 0921  BP: 143/90  Pulse: 94  Temp: 37.1 C  Resp: 20    Complications: No apparent anesthesia complications

## 2015-06-13 NOTE — Anesthesia Procedure Notes (Signed)
Procedure Name: Intubation Date/Time: 06/13/2015 10:40 AM Performed by: Nelda Marseille Pre-anesthesia Checklist: Patient identified, Patient being monitored, Timeout performed, Emergency Drugs available and Suction available Patient Re-evaluated:Patient Re-evaluated prior to inductionOxygen Delivery Method: Circle system utilized Preoxygenation: Pre-oxygenation with 100% oxygen Intubation Type: IV induction Ventilation: Mask ventilation without difficulty Laryngoscope Size: Mac and 3 Grade View: Grade I Tube type: Oral Tube size: 7.5 mm Number of attempts: 1 Airway Equipment and Method: Stylet Placement Confirmation: ETT inserted through vocal cords under direct vision,  positive ETCO2 and breath sounds checked- equal and bilateral Secured at: 21 cm Tube secured with: Tape Dental Injury: Teeth and Oropharynx as per pre-operative assessment

## 2015-06-13 NOTE — Anesthesia Postprocedure Evaluation (Signed)
  Anesthesia Post-op Note  Patient: Joshua Mays  Procedure(s) Performed: Procedure(s): HERNIA REPAIR VENTRAL ADULT (N/A)  Anesthesia type:General  Patient location: PACU  Post pain: Pain level controlled  Post assessment: Post-op Vital signs reviewed, Patient's Cardiovascular Status Stable, Respiratory Function Stable, Patent Airway and No signs of Nausea or vomiting  Post vital signs: Reviewed and stable  Last Vitals:  Filed Vitals:   06/13/15 1422  BP: 144/98  Pulse: 92  Temp: 36.7 C  Resp: 18    Level of consciousness: awake, alert  and patient cooperative  Complications: No apparent anesthesia complications

## 2015-06-14 ENCOUNTER — Ambulatory Visit: Payer: Medicaid Other | Admitting: Family Medicine

## 2015-06-14 ENCOUNTER — Encounter: Payer: Self-pay | Admitting: General Surgery

## 2015-06-14 MED ORDER — ZOLPIDEM TARTRATE 5 MG PO TABS
10.0000 mg | ORAL_TABLET | Freq: Every day | ORAL | Status: DC
  Administered 2015-06-14: 10 mg via ORAL
  Filled 2015-06-14: qty 2

## 2015-06-14 MED ORDER — ACETAMINOPHEN 325 MG PO TABS
650.0000 mg | ORAL_TABLET | Freq: Every day | ORAL | Status: DC
  Administered 2015-06-14 – 2015-06-15 (×5): 650 mg via ORAL
  Filled 2015-06-14 (×6): qty 2

## 2015-06-14 MED ORDER — OXYCODONE HCL 5 MG PO TABS
5.0000 mg | ORAL_TABLET | ORAL | Status: DC | PRN
  Administered 2015-06-14 – 2015-06-15 (×5): 5 mg via ORAL
  Filled 2015-06-14 (×5): qty 1

## 2015-06-14 NOTE — Progress Notes (Signed)
Nurse called to report some wheezing and difficulty with cough. Examined at 2 PM. Late exp wheeze in LLL cleared w/ deep breathing, otherwise clear. Good air exchange. Ambulation encouraged. Irritation likely secondary to ET.

## 2015-06-14 NOTE — Progress Notes (Signed)
Pt noted to be slightly more confused with increased activity (jumping out of bed, pulling off nasal cannula repeatedly, fiddling with tubing and ostomy pouch) 1-2 hours after [place] given with behaviors continuing off and on through out the night, will pass on to day shift nurse to continue to monitor. Pt currently A&O x 4. Ambulated in halls x 1 today.

## 2015-06-14 NOTE — Progress Notes (Signed)
Notified Dr Bary Castilla of pt request for mucinex; pt stated he was feeling congested and that he felt he had mucous to cough up but that when he coughs it is painful; Dr acknowledged, stated to have pt ambulate; no additional orders

## 2015-06-14 NOTE — Progress Notes (Signed)
Notified Dr Bary Castilla of pt request for medication to help him sleep; Dr acknowledge, stated he would put in orders

## 2015-06-14 NOTE — Progress Notes (Signed)
AVSS. Tolerating diet. Pain well controlled. Has ambulated. Lungs: Clear. ABD: Soft. Binder intact. Stoma functioning. Calves: Soft.  Plan: Advance diet,change to oral meds. Tentative d/c in AM.

## 2015-06-15 NOTE — Progress Notes (Signed)
Pt d/c home; d/c instructions reviewed w/ pt; pt understanding was verbalized; IV removed catheter in tact, gauze dressing applied; all pt questions answered; JP drain previously removed by MD; all incisions CDI at time of d/c;  pt left unit via wheelchair accompanied by staff

## 2015-06-15 NOTE — Discharge Instructions (Signed)
Hernia Repair Care After These instructions give you information on caring for yourself after your procedure. Your doctor may also give you more specific instructions. Call your doctor if you have any problems or questions after your procedure. HOME CARE   You may have changes in your poops (bowel movements).  You may have loose or watery poop (diarrhea).  You may be not able to poop.  Your bowels will slowly get back to normal.  Do not eat any food that makes you sick to your stomach (nauseous). Eat small meals 4 to 6 times a day instead of 3 large ones.  Do not drink pop. It will give you gas.  Do not drink alcohol.  Do not lift anything heavier than 10 pounds. This is about the weight of a gallon of milk.  Do not do anything that makes you very tired for at least 6 weeks.  Do not get your wound wet for 2 days.  You may take a sponge bath during this time.  After 2 days you may take a shower. Gently pat your surgical cut (incision) dry with a towel. Do not rub it.  For men: You may have been given an athletic supporter (scrotal support) before you left the hospital. It holds your scrotum and testicles closer to your body so there is no strain on your wound. Wear the supporter until your doctor tells you that you do not need it anymore. GET HELP RIGHT AWAY IF:  You have watery poop, or cannot poop for more than 3 days.  You feel sick to your stomach or throw up (vomit) more than 2 or 3 times.  You have temperature by mouth above 102 F (38.9 C).  You see redness or puffiness (swelling) around your wound.  You see yellowish white fluid (pus) coming from your wound.  You see a bulge or bump in your lower belly (abdomen) or near your groin.  You develop a rash, trouble breathing, or any other symptoms from medicines taken. MAKE SURE YOU:  Understand these instructions.  Will watch your condition.  Will get help right away if your are not doing well or get  worse. Document Released: 08/23/2008 Document Revised: 12/03/2011 Document Reviewed: 08/23/2008 [facility] Patient Information 2015 [place], [place]. This information is not intended to replace advice given to you by your health care provider. Make sure you discuss any questions you have with your health care provider.  Follow all MD discharge instructions. Take all medications as prescribed. Keep all follow up appointments. If your symptoms return, call your doctor. If you experience any new symptoms that are of concern to you or that are bothersome to you, call your doctor. For all questions and/or concerns, call your doctor.   If you have a medical emergency, call 911  Wear abdominal binder except when in the shower. No lifting over 10 pounds.  No driving until pain free. Resume your regular medications, except home oxycodone.  Tylenol: 2 regular strength tablets 4 x day to control baseline soreness.  Aleve: Two tablets twice day to control baseline soreness.  Oxycodone, 5 mg: One tablet every 4 hours if needed for pain. This medication may constipate.  If you experience any bleeding or discharge from your incision sites, redness or swelling at your incision sites, fever, pain uncontrolled by medication, increase in pain, or any nausea, call your doctor.

## 2015-06-18 ENCOUNTER — Encounter: Payer: Self-pay | Admitting: Family Medicine

## 2015-06-18 ENCOUNTER — Ambulatory Visit (INDEPENDENT_AMBULATORY_CARE_PROVIDER_SITE_OTHER): Payer: Medicaid Other | Admitting: Family Medicine

## 2015-06-18 VITALS — BP 108/70 | HR 106 | Temp 98.6°F | Resp 16 | Wt 204.0 lb

## 2015-06-18 DIAGNOSIS — R5082 Postprocedural fever: Secondary | ICD-10-CM

## 2015-06-18 NOTE — Progress Notes (Signed)
Subjective:     Patient ID: Joshua Mays, male   DOB: 07/25/1966, 49 y.o.   MRN: [id number]  HPI  Chief Complaint  Patient presents with  . Fever    Patient reports that he had an hernia repair about 1 week ago, and he has had fevers off and on. Up to 102 degrees. Patient reports that he took Tylenol this morning around 8am.   Reports he feels the fever broke in the last 24 hours and he feels much better. Has been taking ibuprofen and Tylenol scheduled for his symptoms. Continues to smoke.   Review of Systems  Constitutional: Negative for chills.  Respiratory: Negative for cough and shortness of breath.   Cardiovascular: Negative for leg swelling.       Objective:   Physical Exam  Constitutional: He appears well-developed and well-nourished. No distress.  Pulmonary/Chest: Breath sounds normal. No respiratory distress. He has no wheezes.  Abdominal:  Surgical wound well approximated with staples. No tenderness or purulent drainage noted. Mild sero-sanguinous drainage at distal aspect.  Musculoskeletal: He exhibits no edema ( of lower extremities ).       Assessment:    1. Post-procedural fever     Plan:    F/U with Dr. Bary Castilla early next week. Encouraged deep breathing.

## 2015-06-18 NOTE — Patient Instructions (Signed)
Follow up with Dr. Bary Castilla early next week.

## 2015-06-22 ENCOUNTER — Encounter: Payer: Self-pay | Admitting: General Surgery

## 2015-06-22 ENCOUNTER — Ambulatory Visit (INDEPENDENT_AMBULATORY_CARE_PROVIDER_SITE_OTHER): Payer: Medicaid Other | Admitting: General Surgery

## 2015-06-22 VITALS — BP 126/78 | HR 78 | Resp 14 | Ht 72.0 in | Wt 213.0 lb

## 2015-06-22 DIAGNOSIS — IMO0001 Reserved for inherently not codable concepts without codable children: Secondary | ICD-10-CM

## 2015-06-22 DIAGNOSIS — K432 Incisional hernia without obstruction or gangrene: Secondary | ICD-10-CM

## 2015-06-22 DIAGNOSIS — T814XXA Infection following a procedure, initial encounter: Secondary | ICD-10-CM

## 2015-06-22 MED ORDER — HYDROCODONE-ACETAMINOPHEN 7.5-325 MG PO TABS: 1.0000 | ORAL_TABLET | ORAL | Status: DC | PRN

## 2015-06-22 MED ORDER — SULFAMETHOXAZOLE-TRIMETHOPRIM 800-160 MG PO TABS: 1.0000 | ORAL_TABLET | Freq: Two times a day (BID) | ORAL | Status: DC

## 2015-06-22 NOTE — Progress Notes (Signed)
Patient ID: Joshua Mays, male   DOB: [birth year], 49 y.o.   MRN: [id number]  Chief Complaint  Patient presents with  . Routine Post Op    ventral hernia    HPI Joshua Mays is a 49 y.o. male here today for his post op ventral hernia repair completed on 06/13/15. The patient was observed for 48 hours after the procedure to be sure pain control be adequate. He reports that shortly after discharge he noticed drainage from the wound. He reported that he had low-grade fevers, but as they did not reach 102 as the discharge instructions set as the level to call, he did not call. The drainage increased and he is noted some redness for the last 48 hours, again failing to call for assistance. This is the patient's 11th operative procedure, and I was somewhat surprised that he did not appreciate the wound was not normal.  He reports that he is tolerating his diet well. No difficulty with ileostomy function. No urinary difficulties.  The patient was found to have multiple fascial defects in the area above the umbilicus requiring extensive dissection and placement of a retro-rectus mesh for reinforcement. This was closed with a suction drain below the fascial level removed at 48 hours as per protocol at [facility]. HPI  Past Medical History  Diagnosis Date  . Ulcerative colitis 1990  . Bipolar 1 disorder   . Depression   . History of substance abuse   . Dysrhythmia     "FLUTTERING"  . GERD (gastroesophageal reflux disease)     NO MEDS-WELL-CONTROLLED  . 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 Hypertension 2011    OFF MEDS SINCE 2014-EATING BETTER AND LOST WEIGHT-BP BETTER PER PT  . Gout     Past Surgical History  Procedure Laterality Date  . Hand surgery  2011  . Ileostomy  1990  . Colectomy  1990  . Total colectomy  1990  . Umbilical hernia repair  2001  . Shoulder surgery  2001  . Other surgical history      MULTIPLE ABDOMINAL SURGERIES  .  Appendectomy  1990    TAKEN OUT WITH COLECTOMY  . Ventral hernia repair N/A 06/13/2015    Procedure: HERNIA REPAIR VENTRAL ADULT;  Surgeon: Robert Bellow, MD;  Location: [facility] ORS;  Service: General;  Laterality: N/A;    Family History  Problem Relation Age of Onset  . Hyperlipidemia Mother   . Diabetes Father   . Vascular Disease Father   . Alcohol abuse Father   . Depression Father   . Hyperlipidemia Brother   . CVA Maternal Grandfather     Social History Social History  Substance Use Topics  . Smoking status: Current Every Day Smoker -- 0.50 packs/day for 16 years    Types: Cigarettes  . Smokeless tobacco: Never Used  . Alcohol Use: 0.0 oz/week    0 Standard drinks or equivalent per week     Comment: OCC    Allergies  Allergen Reactions  . Cefaclor Rash  . Ciprofloxacin Rash    Current Outpatient Prescriptions  Medication Sig Dispense Refill  . clonazePAM (KLONOPIN) 0.5 MG tablet Take 0.5 mg by mouth as needed.     . potassium chloride 20 MEQ TBCR Take 10 mEq by mouth 2 (two) times daily. 20 tablet 0  . QUEtiapine (SEROQUEL) 400 MG tablet Take 400 mg by mouth at bedtime.     Marland Kitchen  HYDROcodone-acetaminophen (NORCO) 7.5-325 MG tablet Take 1 tablet by mouth every 4 (four) hours as needed for moderate pain. 30 tablet 0  . sulfamethoxazole-trimethoprim (BACTRIM DS,SEPTRA DS) 800-160 MG tablet Take 1 tablet by mouth 2 (two) times daily. 30 tablet 0   No current facility-administered medications for this visit.    Review of Systems Review of Systems  Constitutional: Negative.   Respiratory: Negative.   Cardiovascular: Negative.     Blood pressure 126/78, pulse 78, resp. rate 14, height 6' (1.829 m), weight 213 lb (96.616 kg).  Physical Exam Physical Exam  Constitutional: He is oriented to person, place, and time. He appears well-developed and well-nourished.  Cardiovascular: Normal rate, regular rhythm and normal heart sounds.   Pulmonary/Chest: Effort normal and  breath sounds normal.  Abdominal:       Neurological: He is alert and oriented to person, place, and time.  Skin: Skin is warm and dry.   Data:   Limited ultrasound showed a fluid collection below the midportion of the wound.  Lower prep was applied to the skin followed by 1 mL 1% Xylocaine. A 20 mL syringe was used and aspiration undertaken of 5 mL of turbid fluid. Culture was sent for aerobic organisms. There was no evidence of fluid below the level of the fascia. There was still evidence of a significant fluid volume below the skin and it was elected to open the midportion of the wound. This was completed using 3 mL of 1% plain Xylocaine. A 3 cm area was opened with old bloody fluid without odor. Dry dressing applied.   Assessment    Wound infection.     Plan    The patient was instructed in regards to wound care. He'll shower daily. Dressing change as needed. Ill be placed on Bactrim DS one by mouth twice a day. He was instructed to call if he develops fever or chills, and if this happens he will require inpatient intravenous antibiotic.      PCP:  Juliette Mangle, Forest Gleason 06/23/2015, 9:01 AM

## 2015-06-23 DIAGNOSIS — T8149XA Infection following a procedure, other surgical site, initial encounter: Secondary | ICD-10-CM | POA: Insufficient documentation

## 2015-06-25 ENCOUNTER — Encounter: Payer: Self-pay | Admitting: Emergency Medicine

## 2015-06-25 ENCOUNTER — Inpatient Hospital Stay
Admission: EM | Admit: 2015-06-25 | Discharge: 2015-07-04 | DRG: 857 | Disposition: A | Discharge status: Home Health | Payer: Medicaid Other | Attending: General Surgery | Admitting: General Surgery

## 2015-06-25 DIAGNOSIS — Z85828 Personal history of other malignant neoplasm of skin: Secondary | ICD-10-CM | POA: Diagnosis not present

## 2015-06-25 DIAGNOSIS — F319 Bipolar disorder, unspecified: Secondary | ICD-10-CM | POA: Diagnosis present

## 2015-06-25 DIAGNOSIS — F329 Major depressive disorder, single episode, unspecified: Secondary | ICD-10-CM | POA: Diagnosis present

## 2015-06-25 DIAGNOSIS — Z8739 Personal history of other diseases of the musculoskeletal system and connective tissue: Secondary | ICD-10-CM | POA: Diagnosis not present

## 2015-06-25 DIAGNOSIS — M109 Gout, unspecified: Secondary | ICD-10-CM | POA: Diagnosis present

## 2015-06-25 DIAGNOSIS — E538 Deficiency of other specified B group vitamins: Secondary | ICD-10-CM | POA: Diagnosis present

## 2015-06-25 DIAGNOSIS — A4901 Methicillin susceptible Staphylococcus aureus infection, unspecified site: Secondary | ICD-10-CM | POA: Diagnosis present

## 2015-06-25 DIAGNOSIS — F3130 Bipolar disorder, current episode depressed, mild or moderate severity, unspecified: Secondary | ICD-10-CM

## 2015-06-25 DIAGNOSIS — F603 Borderline personality disorder: Secondary | ICD-10-CM

## 2015-06-25 DIAGNOSIS — T8149XA Infection following a procedure, other surgical site, initial encounter: Secondary | ICD-10-CM | POA: Diagnosis present

## 2015-06-25 DIAGNOSIS — F1010 Alcohol abuse, uncomplicated: Secondary | ICD-10-CM | POA: Diagnosis present

## 2015-06-25 DIAGNOSIS — L7634 Postprocedural seroma of skin and subcutaneous tissue following other procedure: Secondary | ICD-10-CM | POA: Diagnosis present

## 2015-06-25 DIAGNOSIS — L0291 Cutaneous abscess, unspecified: Secondary | ICD-10-CM

## 2015-06-25 DIAGNOSIS — G8929 Other chronic pain: Secondary | ICD-10-CM | POA: Diagnosis present

## 2015-06-25 DIAGNOSIS — Z888 Allergy status to other drugs, medicaments and biological substances status: Secondary | ICD-10-CM | POA: Diagnosis not present

## 2015-06-25 DIAGNOSIS — Z79899 Other long term (current) drug therapy: Secondary | ICD-10-CM

## 2015-06-25 DIAGNOSIS — Z881 Allergy status to other antibiotic agents status: Secondary | ICD-10-CM | POA: Diagnosis not present

## 2015-06-25 DIAGNOSIS — IMO0001 Reserved for inherently not codable concepts without codable children: Secondary | ICD-10-CM

## 2015-06-25 DIAGNOSIS — T814XXD Infection following a procedure, subsequent encounter: Secondary | ICD-10-CM | POA: Diagnosis not present

## 2015-06-25 DIAGNOSIS — Z9049 Acquired absence of other specified parts of digestive tract: Secondary | ICD-10-CM

## 2015-06-25 DIAGNOSIS — Y838 Other surgical procedures as the cause of abnormal reaction of the patient, or of later complication, without mention of misadventure at the time of the procedure: Secondary | ICD-10-CM | POA: Diagnosis present

## 2015-06-25 DIAGNOSIS — K5010 Crohn's disease of large intestine without complications: Secondary | ICD-10-CM | POA: Diagnosis present

## 2015-06-25 DIAGNOSIS — Z932 Ileostomy status: Secondary | ICD-10-CM | POA: Diagnosis not present

## 2015-06-25 DIAGNOSIS — L0390 Cellulitis, unspecified: Secondary | ICD-10-CM | POA: Diagnosis present

## 2015-06-25 DIAGNOSIS — L02211 Cutaneous abscess of abdominal wall: Secondary | ICD-10-CM | POA: Diagnosis present

## 2015-06-25 DIAGNOSIS — K219 Gastro-esophageal reflux disease without esophagitis: Secondary | ICD-10-CM | POA: Diagnosis present

## 2015-06-25 DIAGNOSIS — R21 Rash and other nonspecific skin eruption: Secondary | ICD-10-CM | POA: Diagnosis not present

## 2015-06-25 DIAGNOSIS — T814XXA Infection following a procedure, initial encounter: Secondary | ICD-10-CM | POA: Diagnosis present

## 2015-06-25 DIAGNOSIS — K5190 Ulcerative colitis, unspecified, without complications: Secondary | ICD-10-CM | POA: Diagnosis present

## 2015-06-25 DIAGNOSIS — T83728A Exposure of other implanted mesh and other prosthetic materials to surrounding organ or tissue, initial encounter: Secondary | ICD-10-CM | POA: Diagnosis present

## 2015-06-25 DIAGNOSIS — R531 Weakness: Secondary | ICD-10-CM | POA: Diagnosis present

## 2015-06-25 DIAGNOSIS — M795 Residual foreign body in soft tissue: Secondary | ICD-10-CM | POA: Diagnosis present

## 2015-06-25 DIAGNOSIS — F17210 Nicotine dependence, cigarettes, uncomplicated: Secondary | ICD-10-CM | POA: Diagnosis present

## 2015-06-25 LAB — COMPREHENSIVE METABOLIC PANEL
ALT: 15 U/L — AB (ref 17–63)
AST: 18 U/L (ref 15–41)
Albumin: 3.1 g/dL — ABNORMAL LOW (ref 3.5–5.0)
Alkaline Phosphatase: 89 U/L (ref 38–126)
Anion gap: 8 (ref 5–15)
BUN: 13 mg/dL (ref 6–20)
CHLORIDE: 106 mmol/L (ref 101–111)
CO2: 24 mmol/L (ref 22–32)
CREATININE: 1.03 mg/dL (ref 0.61–1.24)
Calcium: 9.4 mg/dL (ref 8.9–10.3)
Glucose, Bld: 150 mg/dL — ABNORMAL HIGH (ref 65–99)
POTASSIUM: 4 mmol/L (ref 3.5–5.1)
SODIUM: 138 mmol/L (ref 135–145)
Total Bilirubin: 0.5 mg/dL (ref 0.3–1.2)
Total Protein: 7 g/dL (ref 6.5–8.1)

## 2015-06-25 LAB — CBC WITH DIFFERENTIAL/PLATELET
Basophils Absolute: 0.1 10*3/uL (ref 0–0.1)
Basophils Relative: 1 %
EOS ABS: 0.3 10*3/uL (ref 0–0.7)
Eosinophils Relative: 2 %
HCT: 38.8 % — ABNORMAL LOW (ref 40.0–52.0)
Hemoglobin: 13.1 g/dL (ref 13.0–18.0)
LYMPHS ABS: 1.3 10*3/uL (ref 1.0–3.6)
Lymphocytes Relative: 10 %
MCH: 30.1 pg (ref 26.0–34.0)
MCHC: 33.7 g/dL (ref 32.0–36.0)
MCV: 89.1 fL (ref 80.0–100.0)
MONO ABS: 0.7 10*3/uL (ref 0.2–1.0)
Monocytes Relative: 5 %
NEUTROS PCT: 82 %
Neutro Abs: 10.9 10*3/uL — ABNORMAL HIGH (ref 1.4–6.5)
PLATELETS: 343 10*3/uL (ref 150–440)
RBC: 4.35 MIL/uL — AB (ref 4.40–5.90)
RDW: 13.5 % (ref 11.5–14.5)
WBC: 13.3 10*3/uL — AB (ref 3.8–10.6)

## 2015-06-25 MED ORDER — HYDROMORPHONE HCL 1 MG/ML IJ SOLN
1.0000 mg | Freq: Once | INTRAMUSCULAR | Status: AC
Start: 2015-06-25 — End: 2015-06-25
  Administered 2015-06-25: 1 mg via INTRAVENOUS
  Filled 2015-06-25: qty 1

## 2015-06-25 MED ORDER — LIDOCAINE-EPINEPHRINE (PF) 1 %-1:200000 IJ SOLN
INTRAMUSCULAR | Status: AC
  Administered 2015-06-25: 20 mL via INTRADERMAL
  Filled 2015-06-25: qty 30

## 2015-06-25 MED ORDER — VANCOMYCIN HCL IN DEXTROSE 1-5 GM/200ML-% IV SOLN
1000.0000 mg | Freq: Once | INTRAVENOUS | Status: AC
  Administered 2015-06-25: 1000 mg via INTRAVENOUS
  Filled 2015-06-25: qty 200

## 2015-06-25 MED ORDER — SODIUM CHLORIDE 0.9 % IV BOLUS (SEPSIS)
500.0000 mL | Freq: Once | INTRAVENOUS | Status: AC
  Administered 2015-06-25: 500 mL via INTRAVENOUS

## 2015-06-25 MED ORDER — LIDOCAINE-EPINEPHRINE (PF) 2 %-1:200000 IJ SOLN
20.0000 mL | Freq: Once | INTRAMUSCULAR | Status: DC
  Filled 2015-06-25: qty 20

## 2015-06-25 MED ORDER — ONDANSETRON HCL 4 MG/2ML IJ SOLN
4.0000 mg | Freq: Once | INTRAMUSCULAR | Status: AC
  Administered 2015-06-25: 4 mg via INTRAVENOUS
  Filled 2015-06-25: qty 2

## 2015-06-25 MED ORDER — MORPHINE SULFATE (PF) 2 MG/ML IV SOLN
1.0000 mg | INTRAVENOUS | Status: DC | PRN
  Administered 2015-06-25 (×2): 2 mg via INTRAVENOUS
  Administered 2015-06-26: 4 mg via INTRAVENOUS
  Administered 2015-06-26 (×4): 2 mg via INTRAVENOUS
  Administered 2015-06-26: 4 mg via INTRAVENOUS
  Administered 2015-06-26 (×2): 2 mg via INTRAVENOUS
  Administered 2015-06-27 – 2015-06-28 (×7): 4 mg via INTRAVENOUS
  Administered 2015-06-28: 2 mg via INTRAVENOUS
  Administered 2015-06-28: 4 mg via INTRAVENOUS
  Administered 2015-06-29 – 2015-06-30 (×6): 2 mg via INTRAVENOUS
  Administered 2015-06-30 (×2): 4 mg via INTRAVENOUS
  Administered 2015-06-30: 2 mg via INTRAVENOUS
  Administered 2015-06-30: 4 mg via INTRAVENOUS
  Filled 2015-06-25 (×2): qty 2
  Filled 2015-06-25: qty 1
  Filled 2015-06-25: qty 2
  Filled 2015-06-25 (×2): qty 1
  Filled 2015-06-25: qty 2
  Filled 2015-06-25 (×2): qty 1
  Filled 2015-06-25: qty 2
  Filled 2015-06-25 (×4): qty 1
  Filled 2015-06-25: qty 2
  Filled 2015-06-25: qty 1
  Filled 2015-06-25 (×4): qty 2
  Filled 2015-06-25 (×2): qty 1
  Filled 2015-06-25: qty 2
  Filled 2015-06-25 (×4): qty 1
  Filled 2015-06-25 (×2): qty 2

## 2015-06-25 MED ORDER — HYDROCODONE-ACETAMINOPHEN 5-325 MG PO TABS
1.0000 | ORAL_TABLET | ORAL | Status: DC | PRN
  Administered 2015-06-27: 1 via ORAL
  Administered 2015-06-28 – 2015-07-03 (×20): 2 via ORAL
  Filled 2015-06-25 (×2): qty 2
  Filled 2015-06-25: qty 1
  Filled 2015-06-25 (×19): qty 2

## 2015-06-25 MED ORDER — ACETAMINOPHEN 325 MG PO TABS
650.0000 mg | ORAL_TABLET | Freq: Four times a day (QID) | ORAL | Status: DC | PRN
  Administered 2015-06-26 – 2015-07-03 (×5): 650 mg via ORAL
  Filled 2015-06-25 (×5): qty 2

## 2015-06-25 MED ORDER — ONDANSETRON HCL 4 MG/2ML IJ SOLN
4.0000 mg | Freq: Four times a day (QID) | INTRAMUSCULAR | Status: DC | PRN
  Filled 2015-06-25: qty 2

## 2015-06-25 MED ORDER — ZOLPIDEM TARTRATE 5 MG PO TABS
5.0000 mg | ORAL_TABLET | Freq: Every evening | ORAL | Status: DC | PRN
  Administered 2015-06-25 – 2015-07-03 (×10): 5 mg via ORAL
  Filled 2015-06-25 (×10): qty 1

## 2015-06-25 MED ORDER — QUETIAPINE FUMARATE ER 200 MG PO TB24
400.0000 mg | ORAL_TABLET | Freq: Every day | ORAL | Status: DC
  Administered 2015-06-25 – 2015-07-03 (×9): 400 mg via ORAL
  Filled 2015-06-25 (×11): qty 1

## 2015-06-25 MED ORDER — KCL IN DEXTROSE-NACL 20-5-0.2 MEQ/L-%-% IV SOLN
INTRAVENOUS | Status: DC
  Administered 2015-06-25: 1 mL via INTRAVENOUS
  Administered 2015-06-26 – 2015-06-28 (×4): via INTRAVENOUS
  Filled 2015-06-25 (×8): qty 1000

## 2015-06-25 MED ORDER — VANCOMYCIN HCL 10 G IV SOLR
1500.0000 mg | Freq: Two times a day (BID) | INTRAVENOUS | Status: DC
  Administered 2015-06-26 – 2015-06-30 (×9): 1500 mg via INTRAVENOUS
  Filled 2015-06-25 (×11): qty 1500

## 2015-06-25 MED ORDER — VANCOMYCIN HCL 10 G IV SOLR: 1500.0000 mg | Freq: Two times a day (BID) | INTRAVENOUS | Status: DC

## 2015-06-25 MED ORDER — ONDANSETRON 4 MG PO TBDP
4.0000 mg | ORAL_TABLET | Freq: Four times a day (QID) | ORAL | Status: DC | PRN
  Administered 2015-07-03: 4 mg via ORAL
  Filled 2015-06-25: qty 1

## 2015-06-25 NOTE — ED Provider Notes (Signed)
Time Seen: Approximately ----------------------------------------- 5:23 PM on 06/25/2015 -----------------------------------------    I have reviewed the triage notes  Chief Complaint: Post-op Problem   History of Present Illness: Joshua Mays is a 49 y.o. male who presents with increased pain and swelling in his postoperative site. Patient had surgery on a ventral hernia which required extensive dissection and mesh placement. Patient's had some postoperative difficulty and was hospitalized at that time with private suction drained etc. the patient states that he was seen by a surgeon on Wednesday and was placed on some oral antibiotics (Bactrim DS) and some of his staples were removed for wound drainage. He states he's not had a fever today is "" felt bad"" and has been on some Norco as taken ibuprofen at home for pain. Patient states that he's not had any recent wound drainage but noticed more increased pain and redness around his operative site with swelling toward the ileostomy area. He states he still has had normal output from his ileostomy site.  Past Medical History  Diagnosis Date  . Ulcerative colitis ([place]) 1990  . Bipolar 1 disorder ([place])   . Depression   . History of substance abuse   . Dysrhythmia     "FLUTTERING"  . GERD (gastroesophageal reflux disease)     NO MEDS-WELL-CONTROLLED  . 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 Hypertension 2011    OFF MEDS SINCE 2014-EATING BETTER AND LOST WEIGHT-BP BETTER PER PT  . Gout     Patient Active Problem List   Diagnosis Date Noted  . Post-operative wound abscess 06/23/2015  . Ventral hernia 06/13/2015  . 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 Hernia, umbilical 29/47/6546  . 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 Chronic pain 05/18/2015  . CC (Crohn's colitis) 05/18/2015  . Clinical depression 05/18/2015  . Failure of erection 05/18/2015  . 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  Personal history of urinary disorder 05/18/2015  . H/O malignant neoplasm 05/18/2015  . H/O: substance abuse 05/18/2015  . Personal history of arthritis 05/18/2015  . BP (high blood pressure) 05/18/2015  . 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 Colitis gravis 05/18/2015  . B12 deficiency 05/18/2015  . 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 Hand surgery  2011  . Ileostomy  1990  . Colectomy  1990  . Total colectomy  1990  . Umbilical hernia repair  2001  . Shoulder surgery  2001  . Other surgical history      MULTIPLE ABDOMINAL SURGERIES  . Appendectomy  1990    TAKEN OUT WITH COLECTOMY  . Ventral hernia repair N/A 06/13/2015    Procedure: HERNIA REPAIR VENTRAL ADULT;  Surgeon: Robert Bellow, MD;  Location: [facility] ORS;  Service: General;  Laterality: N/A;    Past Surgical History  Procedure Laterality Date  . Hand surgery  2011  . Ileostomy  1990  . Colectomy  1990  . Total colectomy  1990  . Umbilical hernia repair  2001  . Shoulder surgery  2001  . Other surgical history      MULTIPLE ABDOMINAL SURGERIES  . Appendectomy  1990    TAKEN OUT WITH COLECTOMY  . Ventral hernia repair N/A 06/13/2015    Procedure: HERNIA REPAIR VENTRAL ADULT;  Surgeon: Robert Bellow, MD;  Location: [facility] ORS;  Service: General;  Laterality: N/A;    Current Outpatient Rx  Name  Route  Sig  Dispense  Refill  . clonazePAM (KLONOPIN) 0.5 MG tablet   Oral   Take 0.5 mg by mouth daily as needed for anxiety.          Marland Kitchen HYDROcodone-acetaminophen (NORCO) 7.5-325 MG tablet   Oral   Take 1 tablet by mouth every 4 (four) hours as needed for moderate pain.   30 tablet   0   . ibuprofen (ADVIL,MOTRIN) 200 MG tablet   Oral   Take 200 mg by mouth every 4 (four) hours as needed for mild pain or moderate pain.         . potassium chloride (K-DUR) 10 MEQ tablet   Oral   Take 10 mEq by mouth 4 (four) times daily.          . QUEtiapine (SEROQUEL) 400 MG tablet   Oral   Take 400 mg by mouth at bedtime.          . sulfamethoxazole-trimethoprim (BACTRIM DS,SEPTRA DS) 800-160 MG tablet   Oral   Take 1 tablet by mouth 2 (two) times daily.   30 tablet   0   . potassium chloride 20 MEQ TBCR   Oral   Take 10 mEq by mouth 2 (two) times daily.   20 tablet   0     Allergies:  Cefaclor and Ciprofloxacin  Family History: Family History  Problem Relation Age of Onset  . Hyperlipidemia Mother   . Diabetes Father   . Vascular Disease Father   . Alcohol abuse Father   . Depression Father   . Hyperlipidemia Brother   . CVA Maternal Grandfather     Social History: Social History  Substance Use Topics  . Smoking status: Current Every Day Smoker -- 0.50 packs/day for 16 years    Types: Cigarettes  . Smokeless tobacco: Never Used  . Alcohol Use: 0.0 oz/week    0 Standard drinks or equivalent per week     Comment: OCC     Review of Systems:   10 point review of systems was performed and was otherwise negative:  Constitutional: Subjective fever at home Eyes: No visual disturbances ENT: No sore throat, ear pain Cardiac: No chest pain Respiratory: No shortness of breath, wheezing, or stridor Abdomen: No abdominal pain, no vomiting, No diarrhea Endocrine: No weight loss, No night sweats Extremities: No peripheral edema, cyanosis Skin: No rashes, easy bruising Neurologic: No focal weakness, trouble with speech or swollowing Urologic: No dysuria, Hematuria, or urinary frequency   Physical Exam:  ED Triage Vitals  Enc Vitals Group     BP 06/25/15 1323 127/87 mmHg     Pulse Rate 06/25/15 1323 114     Resp 06/25/15 1323 20     Temp 06/25/15 1323 98 F (36.7 C)     Temp Source 06/25/15 1323 Oral     SpO2 06/25/15 1323 96 %     Weight 06/25/15 1323 200 lb (90.719 kg)     Height 06/25/15 1323 5\' 11"  (1.803 m)     Head Cir --      Peak Flow --      Pain Score 06/25/15 1324 8     Pain  Loc --      Pain Edu? --      Excl. in [place]? --     General: Awake , Alert , and Oriented times 3; GCS 15 Head: Normal cephalic , atraumatic Eyes: Pupils equal , round, reactive to light Nose/Throat: No nasal drainage, patent  upper airway without erythema or exudate.  Neck: Supple, Full range of motion, No anterior adenopathy or palpable thyroid masses Lungs: Clear to ascultation without wheezes , rhonchi, or rales Heart: Regular rate, regular rhythm without murmurs , gallops , or rubs Abdomen: Soft, non tender without rebound, guarding , or rigidity; bowel sounds positive and symmetric in all 4 quadrants. No organomegaly .        Extremities: 2 plus symmetric pulses. No edema, clubbing or cyanosis Neurologic: normal ambulation, Motor symmetric without deficits, sensory intact Skin: warm, dry, no rashes   Labs:   All laboratory work was reviewed including any pertinent negatives or positives listed below:  Labs Reviewed  CBC WITH DIFFERENTIAL/PLATELET - Abnormal; Notable for the following:    WBC 13.3 (*)    RBC 4.35 (*)    HCT 38.8 (*)    Neutro Abs 10.9 (*)    All other components within normal limits  COMPREHENSIVE METABOLIC PANEL - Abnormal; Notable for the following:    Glucose, Bld 150 (*)    Albumin 3.1 (*)    ALT 15 (*)    All other components within normal limits  CULTURE, BLOOD (ROUTINE X 2)  CULTURE, BLOOD (ROUTINE X 2)   review laboratory work shows an elevated white blood cell count.    ED Course:  Patient will be started on vancomycin after obtaining his blood cultures. I spoke to the on call surgeon who is agreed to see and evaluate the patient, further disposition and management depends upon his evaluation at this time.    Assessment:  Postoperative wound infection   Final Clinical Impression Final diagnoses:  Cellulitis, wound, post-operative, initial encounter     Plan: Inpatient management. Surgical consultation          Daymon Larsen, MD 06/25/15 1726

## 2015-06-25 NOTE — ED Notes (Addendum)
Pt states he went to PCP for concerns of surgical site infection, was placed on bactrim last Wednesday and reports infection has gotten worse since. Pt afebrile, surgical site red, swollen, hot to touch, tender with small scattered open areas with yellow wound beds. Ileostomy to RLQ.

## 2015-06-25 NOTE — Progress Notes (Addendum)
ANTIBIOTIC CONSULT NOTE - INITIAL  Pharmacy Consult for vancomycin Indication: wound infection/abscess  Allergies  Allergen Reactions  . Cefaclor Rash  . Ciprofloxacin Rash    Patient Measurements: Height: 5\' 11"  (180.3 cm) Weight: 207 lb 14.4 oz (94.303 kg) IBW/kg (Calculated) : 75.3 Adjusted Body Weight: 82.8 kg  Vital Signs: Temp: 98.2 F (36.8 C) (10/01 2034) Temp Source: Oral (10/01 2034) BP: 135/79 mmHg (10/01 2034) Pulse Rate: 97 (10/01 2034) Intake/Output from previous day:   Intake/Output from this shift:    Labs:  Recent Labs  06/25/15 1331  WBC 13.3*  HGB 13.1  PLT 343  CREATININE 1.03   Estimated Creatinine Clearance: 101.7 mL/min (by C-G formula based on Cr of 1.03). No results for input(s): VANCOTROUGH, VANCOPEAK, VANCORANDOM, GENTTROUGH, GENTPEAK, GENTRANDOM, TOBRATROUGH, TOBRAPEAK, TOBRARND, AMIKACINPEAK, AMIKACINTROU, AMIKACIN in the last 72 hours.   Microbiology: Recent Results (from the past 720 hour(s))  Anaerobic and Aerobic Culture     Status: None (Preliminary result)   Collection Time: 06/22/15  4:53 PM  Result Value Ref Range Status   Anaerobic Culture Preliminary report  Preliminary   Result 1 Comment  Preliminary    Comment: Specimen has been received and testing has been initiated.   Aerobic Culture Preliminary report  Preliminary   Result 1 Comment  Preliminary    Comment: No growth after 18-24 hours.    Medical History: Past Medical History  Diagnosis Date  . Ulcerative colitis ([place]) 1990  . Bipolar 1 disorder ([place])   . Depression   . History of substance abuse   . Dysrhythmia     "FLUTTERING"  . GERD (gastroesophageal reflux disease)     NO MEDS-WELL-CONTROLLED  . 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 Hypertension 2011    OFF MEDS SINCE 2014-EATING BETTER AND LOST WEIGHT-BP BETTER PER PT  . Gout     Medications:  Infusions:  . dextrose 5 % and 0.2 % NaCl with KCl 20 mEq      Assessment: 48 yom sp ventral hernia repairs approx 12 days ago. After discharge did have some discharge from wound. Started bactrim outpatient. Now presents with increased pain/weakness/chills/low-grade fever. Leukocytosis in ED, starting vancomycin for wound infection/abscess.   Vd 58 L, Ke 0.089 hr-1, T1/2 7.8 hr, predicted trough 15 mcg/mL  Goal of Therapy:  Vancomycin trough level 15-20 mcg/ml  Plan:  Expected duration 7 days with resolution of temperature and/or normalization of WBC. Vancomycin 1.5 gm IV Q12H (received 1 gm in ED at 1800 this evening) with stacked dosing, second dose 6 hours after first. Will continue to follow and adjust as needed to maintain trough 15 to 20 mcg/mL.  Laural Benes, Pharm.D. Clinical Pharmacist 06/25/2015,9:52 PM

## 2015-06-25 NOTE — Op Note (Signed)
OPERATIVE REPORT  PREOPERATIVE  DIAGNOSIS: . Abdominal wall abscess  POSTOPERATIVE DIAGNOSIS: . Abdominal wall abscess possible seroma  PROCEDURE: . Incision and drainage of abdominal wall abscess  ANESTHESIA:  Local 1% Xylocaine with epinephrine  SURGEON: Rochel Brome  MD   INDICATIONS: . He had recent ventral hernia repair and came into the emergency room with redness and swelling and pain  With the patient on a stretcher in the supine position the abdomen was prepared with Betadine solution and draped in a sterile manner. The maximum point of swelling surrounding of the umbilicus. The skin in the old scar just above into the left of the umbilicus was infiltrated with 1% Xylocaine with epinephrine. The wound was incised over a distance of some 2.5 cm. There was an immediate eruption of a serosanguineous thin fluid. The wound was examined with a index finger and found to contain multiple loculations which were drained. Approximately 60 cc of fluid drained out. A culture swab was submitted for routine culture and sensitivity the wound was dressed with 4 x 4 gauze and 2 inch paper tape progress the patient tolerated the procedure satisfactorily. Arrangements were made for hospital admission  [place].D.

## 2015-06-25 NOTE — ED Notes (Signed)
Departure condition at 2026 charted in error.

## 2015-06-25 NOTE — ED Notes (Signed)
Abd incision site numbed by MD, small incision made and wound culture swab obtained by MD. Small amount serosanguinous drainage noted. Site covered by 4x4 and 2in paper tape. Pt tolerated well.

## 2015-06-25 NOTE — ED Notes (Signed)
States thinks he has infection at surgical site and pain

## 2015-06-25 NOTE — H&P (Signed)
History of Present Illness: Joshua Mays is a 49 y.o. male had surgery some 12 days ago to repair of multiple ventral hernias. Mesh was placed deep to the rectus muscles. He was kept in the hospital for about 2 nights for pain control. He reports that after discharge did have some drainage from the wound. He decided Dr. Bary Castilla in the office 3 days ago and there appeared to be some infection. An incision and drainage was done. He was started on a course of Bactrim. He now reports increased pain in the incisional area and also reports weakness and some chills and low-grade fever. Due to increasing pain and came to the emergency room for evaluation. Initial evaluation demonstrated leukocytosis.  Past Medical History:  Past Medical History  Diagnosis Date  . Ulcerative colitis ([place]) 1990  . Bipolar 1 disorder ([place])   . Depression   . History of substance abuse   . Dysrhythmia     "FLUTTERING"  . GERD (gastroesophageal reflux disease)     NO MEDS-WELL-CONTROLLED  . 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 Hypertension 2011    OFF MEDS SINCE 2014-EATING BETTER AND LOST WEIGHT-BP BETTER PER PT  . Gout     Problem List: Patient Active Problem List   Diagnosis Date Noted  . Post-operative wound abscess 06/23/2015  . Ventral hernia 06/13/2015  . 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 Hernia, umbilical 13/04/6577  . 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 Chronic pain 05/18/2015  . CC (Crohn's colitis) 05/18/2015  . Clinical depression 05/18/2015  . Failure of erection 05/18/2015  . 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 Personal history of urinary disorder 05/18/2015  . H/O malignant neoplasm 05/18/2015  . H/O: substance abuse 05/18/2015  . Personal history of arthritis 05/18/2015  . BP (high blood pressure) 05/18/2015  . 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 Colitis gravis 05/18/2015  . B12 deficiency 05/18/2015  . Rhabdomyosarcoma  of upper arm 09/28/2014    Past Surgical History: Past Surgical History  Procedure Laterality Date  . Hand surgery  2011  . Ileostomy  1990  . Colectomy  1990  . Total colectomy  1990  . Umbilical hernia repair  2001  . Shoulder surgery  2001  . Other surgical history      MULTIPLE ABDOMINAL SURGERIES  . Appendectomy  1990    TAKEN OUT WITH COLECTOMY  . Ventral hernia repair N/A 06/13/2015    Procedure: HERNIA REPAIR VENTRAL ADULT;  Surgeon: Robert Bellow, MD;  Location: [facility] ORS;  Service: General;  Laterality: N/A;    Allergies: Allergies  Allergen Reactions  . Cefaclor Rash  . Ciprofloxacin Rash    Home Medications: Bactrim and hydrocodone  (Not in a hospital admission) Home medication reconciliation was completed with the patient.   Scheduled Inpatient Medications:   .  HYDROmorphone (DILAUDID) injection  1 mg Intravenous Once  . ondansetron (ZOFRAN) IV  4 mg Intravenous Once    Continuous Inpatient Infusions:   . sodium chloride    . vancomycin         Family History: family history includes Alcohol abuse in his father; CVA in his maternal grandfather; Depression in his father; Diabetes in his father; Hyperlipidemia in his brother and mother; Vascular Disease in his father.  The patient's family history is negative for inflammatory bowel disorders, GI malignancy, or solid organ transplantation.  Social History:   reports that he has been smoking  Cigarettes.  He has a 8 pack-year smoking history. He has never used smokeless tobacco. He reports that he drinks alcohol. He reports that he does not use illicit drugs. The patient denies ETOH, tobacco, or drug use.   Review of Systems: He reports no other recent acute illness such as cough cold or sore throat. He reports no recent visual changes. He reports no difficulty swallowing. He reports no heartburn. No chest pain. He called his breathing. He reports still has been draining from his ileostomy. He reports good  urine output. He reports no ankle edema. No other recent source or boils. Does have chronic problems with his right hand where he has had Mohs chemosurgery for skin cancer on the posterior aspect of the right hand.   Physical Examination: BP 127/87 mmHg  Pulse 114  Temp(Src) 98 F (36.7 C) (Oral)  Resp 20  Ht 5\' 11"  (1.803 m)  Wt 90.719 kg (200 lb)  BMI 27.91 kg/m2  SpO2 96%  GENERAL:  Awake alert and oriented and in no acute distress.  HEENT:  Head is normocephalic.  Pupils are equal reactive to light.  Extraocular movements are intact. Sclera is clear.  Pharynx is clear.  NECK:  Supple with no palpable mass and no adenopathy.  LUNGS:  Clear without rales rhonchi or wheezes.  HEART:  Regular rhythm S1-S2, without murmur.  Abdomen: Obese with a long midline incision. There is redness of the wound there is firmness around the umbilicus. There is some scant serous drainage. There is moderate tenderness. There is an ileostomy in the right lower quadrant which appears typical.   Extremities: Well-developed well-nourished without edema  Neurologic: Awake alert and oriented and moving all extremities  Lab Results  Component Value Date   WBC 13.3* 06/25/2015   HGB 13.1 06/25/2015   HCT 38.8* 06/25/2015   MCV 89.1 06/25/2015   PLT 343 06/25/2015    Recent Labs Lab 06/25/15 1331  HGB 13.1   Lab Results  Component Value Date   NA 138 06/25/2015   K 4.0 06/25/2015   CL 106 06/25/2015   CO2 24 06/25/2015   BUN 13 06/25/2015   CREATININE 1.03 06/25/2015   GLU 109 10/14/2014   Lab Results  Component Value Date   ALT 15* 06/25/2015   AST 18 06/25/2015   ALKPHOS 89 06/25/2015   BILITOT 0.5 06/25/2015   No results for input(s): APTT, INR, PTT in the last 168 hours. Assessment/Plan: Abdominal wound infection plan incision and drainage and culture. Admit to the hospital for intravenous vancomycin. I discussed the plan with him risk and benefits Joshua Mays is a 49 y.o. male      Rochel Brome, MD

## 2015-06-25 NOTE — Progress Notes (Signed)
IS at bedside. Declined at this time due to pain

## 2015-06-26 MED ORDER — CLONAZEPAM 0.5 MG PO TABS
0.5000 mg | ORAL_TABLET | Freq: Two times a day (BID) | ORAL | Status: DC | PRN
  Administered 2015-06-26: 0.5 mg via ORAL
  Filled 2015-06-26: qty 1

## 2015-06-26 MED ORDER — ALPRAZOLAM 0.5 MG PO TABS
0.5000 mg | ORAL_TABLET | Freq: Four times a day (QID) | ORAL | Status: DC | PRN
  Administered 2015-06-26 – 2015-07-03 (×17): 0.5 mg via ORAL
  Filled 2015-06-26 (×18): qty 1

## 2015-06-26 NOTE — Progress Notes (Signed)
Chief complaint this morning is feeling tired. He also has had some anxiety. He reports he is eating satisfactorily. The ileostomy is functioning Has not walked yet. Has been taking some pain medicine.   Vital signs are stable. Afebrile. Two thirds of the dressing was saturated with serosanguineous fluid and yellow color. There is less erythema and less swelling. The incision and drainage site is approximately 2 cm open. There is minimal abdominal tenderness. 4 x 4 cotton gauze dressings were applied with 2 inch paper tape.  Wound cultures no growth.   Impression is cellulitis with seroma  Pharmacy is consulting and managing vancomycin  Continue dressing changes. Encourage walking. Have prescribed Seroquel at bedtime and Klonopin when needed for anxiety

## 2015-06-27 LAB — CBC WITH DIFFERENTIAL/PLATELET
BASOS ABS: 0.1 10*3/uL (ref 0–0.1)
BASOS PCT: 1 %
EOS ABS: 0.2 10*3/uL (ref 0–0.7)
EOS PCT: 2 %
HCT: 34 % — ABNORMAL LOW (ref 40.0–52.0)
HEMOGLOBIN: 11.6 g/dL — AB (ref 13.0–18.0)
LYMPHS ABS: 1 10*3/uL (ref 1.0–3.6)
Lymphocytes Relative: 11 %
MCH: 30.1 pg (ref 26.0–34.0)
MCHC: 34 g/dL (ref 32.0–36.0)
MCV: 88.5 fL (ref 80.0–100.0)
Monocytes Absolute: 0.7 10*3/uL (ref 0.2–1.0)
Monocytes Relative: 7 %
NEUTROS PCT: 79 %
Neutro Abs: 7.8 10*3/uL — ABNORMAL HIGH (ref 1.4–6.5)
PLATELETS: 343 10*3/uL (ref 150–440)
RBC: 3.85 MIL/uL — AB (ref 4.40–5.90)
RDW: 13.8 % (ref 11.5–14.5)
WBC: 9.8 10*3/uL (ref 3.8–10.6)

## 2015-06-27 LAB — ANAEROBIC AND AEROBIC CULTURE

## 2015-06-27 LAB — CREATININE, SERUM: CREATININE: 0.89 mg/dL (ref 0.61–1.24)

## 2015-06-27 LAB — VANCOMYCIN, TROUGH: VANCOMYCIN TR: 17 ug/mL (ref 10–20)

## 2015-06-27 MED ORDER — ENOXAPARIN SODIUM 40 MG/0.4ML ~~LOC~~ SOLN
40.0000 mg | SUBCUTANEOUS | Status: DC
  Administered 2015-06-27 – 2015-07-03 (×6): 40 mg via SUBCUTANEOUS
  Filled 2015-06-27 (×7): qty 0.4

## 2015-06-27 NOTE — Discharge Summary (Signed)
Physician Discharge Summary  Patient ID: Joshua Mays MRN: [id number] DOB/AGE: 24-Feb-1966 49 y.o.  Admit date: 06/13/2015 Discharge date: 06/27/2015  Admission Diagnoses: Ventral hernia  Discharge Diagnoses: Ileostomy status post proctocolectomy. Active Problems:   Ventral hernia   Discharged Condition: good  Hospital Course: Slow to ambulate but otherwise unremarkable. Admitted for pain control after extensive mobilization of soft tissues for repair of ventral hernia including component separation. Tolerated diet well. He ambulated with much encouragement.   Consults: None  Significant Diagnostic Studies: labs: None.  Treatments: None.  Discharge Exam: Blood pressure 106/76, pulse 66, temperature 97.6 F (36.4 C), temperature source Oral, resp. rate 16, height 6' (1.829 m), weight 200 lb (90.719 kg), SpO2 97 %. Clear Clinical exam at the time of discharge showed the wound to be healing well. No evidence of erythema or induration. Ileostomy stoma functioning well. Clear cardiopulmonary exam.  Disposition: 01-Home or Self Care  Discharge Instructions    Diet - low sodium heart healthy    Complete by:  As directed      Discharge instructions    Complete by:  As directed   Wear abdominal binder except when in the shower. No lifting over 10 pounds.  No driving until pain free. Resume your regular medications, except home oxycodone.  Tylenol: 2 regular strength tablets 4 x day to control baseline soreness.  Aleve: Two tablets twice day to control baseline soreness.  Oxycodone, 5 mg:  One tablet every 4 hours if needed for pain.  This medication may constipate.     Increase activity slowly    Complete by:  As directed             Medication List    STOP taking these medications        ibuprofen 200 MG tablet  Commonly known as:  ADVIL,MOTRIN     OxyCODONE HCl (Abuse [place]eter) 5 MG Taba  Commonly known as:  OXAYDO     Oxycodone HCl 10 MG Tabs      TAKE these  medications        KLONOPIN 0.5 MG tablet  Generic drug:  clonazePAM  Take 0.5 mg by mouth daily as needed for anxiety.     Potassium Chloride ER 20 MEQ Tbcr  Take 10 mEq by mouth 2 (two) times daily.     SEROQUEL 400 MG tablet  Generic drug:  QUEtiapine  Take 400 mg by mouth at bedtime.           Follow-up Information    Follow up with Bary Castilla Forest Gleason, MD. Go on 06/22/2015.   Specialties:  General Surgery, Radiology   Why:  Wednesday at 4:00pm for hospital follow-up   Contact information:   [address] [place] [address] [phone]       Signed: Robert Bellow 06/27/2015, 12:56 PM

## 2015-06-27 NOTE — Progress Notes (Signed)
ANTIBIOTIC CONSULT NOTE - INITIAL  Pharmacy Consult for vancomycin Indication: wound infection/abscess  Allergies  Allergen Reactions  . Cefaclor Rash  . Ciprofloxacin Rash    Patient Measurements: Height: 5\' 11"  (180.3 cm) Weight: 207 lb 14.4 oz (94.303 kg) IBW/kg (Calculated) : 75.3 Adjusted Body Weight: 82.8 kg  Vital Signs: Temp: 98.2 F (36.8 C) (10/03 1240) Temp Source: Oral (10/03 1240) BP: 113/78 mmHg (10/03 1240) Pulse Rate: 79 (10/03 1240) Intake/Output from previous day: 10/02 0701 - 10/03 0700 In: 1541.3 [P.O.:240; I.V.:1301.3] Out: 650 [Urine:650] Intake/Output from this shift: Total I/O In: 898 [P.O.:420; I.V.:478] Out: 100 [Urine:100]  Labs:  Recent Labs  06/25/15 1331 06/27/15 1141  WBC 13.3* 9.8  HGB 13.1 11.6*  PLT 343 343  CREATININE 1.03 0.89   Estimated Creatinine Clearance: 117.7 mL/min (by C-G formula based on Cr of 0.89).  Recent Labs  06/27/15 1141  Bracey 17     Microbiology: Recent Results (from the past 720 hour(s))  Anaerobic and Aerobic Culture     Status: None   Collection Time: 06/22/15  4:53 PM  Result Value Ref Range Status   Anaerobic Culture Final report  Final   Result 1 Comment  Final    Comment: No anaerobic growth in 72 hours.   Aerobic Culture Final report  Final   Result 1 Comment  Final    Comment: No growth in 36 - 48 hours.  Culture, blood (routine x 2)     Status: None (Preliminary result)   Collection Time: 06/25/15  4:47 PM  Result Value Ref Range Status   Specimen Description BLOOD RIGHT ARM  Final   Special Requests BOTTLES DRAWN AEROBIC AND ANAEROBIC 5CC  Final   Culture NO GROWTH 2 DAYS  Final   Report Status PENDING  Incomplete  Culture, blood (routine x 2)     Status: None (Preliminary result)   Collection Time: 06/25/15  4:47 PM  Result Value Ref Range Status   Specimen Description BLOOD LEFT ARM  Final   Special Requests BOTTLES DRAWN AEROBIC AND ANAEROBIC 5CC  Final   Culture NO  GROWTH 2 DAYS  Final   Report Status PENDING  Incomplete  Wound culture     Status: None (Preliminary result)   Collection Time: 06/25/15  6:58 PM  Result Value Ref Range Status   Specimen Description ABDOMEN  Final   Special Requests Normal  Final   Gram Stain PENDING  Incomplete   Culture NO GROWTH < 12 HOURS  Final   Report Status PENDING  Incomplete  Anaerobic culture     Status: None (Preliminary result)   Collection Time: 06/25/15  6:58 PM  Result Value Ref Range Status   Specimen Description ABDOMEN  Final   Special Requests Normal  Final   Culture NO ANAEROBES ISOLATED  Final   Report Status PENDING  Incomplete    Medical History: Past Medical History  Diagnosis Date  . Ulcerative colitis ([place]) 1990  . Bipolar 1 disorder ([place])   . Depression   . History of substance abuse   . Dysrhythmia     "FLUTTERING"  . GERD (gastroesophageal reflux disease)     NO MEDS-WELL-CONTROLLED  . 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 Hypertension 2011    OFF MEDS SINCE 2014-EATING BETTER AND LOST WEIGHT-BP BETTER PER PT  . Gout     Medications:  Infusions:  . dextrose 5 % and 0.2 % NaCl with  KCl 20 mEq 50 mL/hr at 06/26/15 1720   Assessment: 24 yom sp ventral hernia repairs approx 12 days ago. After discharge did have some discharge from wound. Started bactrim outpatient. Now presents with increased pain/weakness/chills/low-grade fever. Leukocytosis in ED, starting vancomycin for wound infection/abscess.   Vd 58 L, Ke 0.089 hr-1, T1/2 7.8 hr, predicted trough 15 mcg/mL  Goal of Therapy:  Vancomycin trough level 15-20 mcg/ml  Plan:  Expected duration 7 days with resolution of temperature and/or normalization of WBC. Vancomycin 1.5 gm IV Q12H (received 1 gm in ED at 1800 this evening) with stacked dosing, second dose 6 hours after first. Will continue to follow and adjust as needed to maintain trough 15 to 20 mcg/mL.  10/3- Vanc trough = 17 Continue  current dose 1.5g q 12 hours. Will recheck trough in 3 days  Ramond Dial, Pharm.D. Clinical Pharmacist 06/27/2015,2:57 PM

## 2015-06-27 NOTE — Progress Notes (Signed)
AVSS. Minimal pain, modest clear/serous drainage. Tolerating diet well.   Wound: Faint erythema for approx 10 cm diameter, markedly improved by patient report. Area of I&D open, odor free. Cultures pending. Stoma functioning. Tolerating diet. Will recheck CBC and decide if formal wound exploration/ wound vac placement needed.

## 2015-06-28 ENCOUNTER — Encounter: Payer: Medicaid Other | Admitting: General Surgery

## 2015-06-28 ENCOUNTER — Encounter: Payer: Self-pay | Admitting: General Surgery

## 2015-06-28 ENCOUNTER — Encounter: Payer: Self-pay | Admitting: Radiology

## 2015-06-28 ENCOUNTER — Inpatient Hospital Stay: Payer: Medicaid Other

## 2015-06-28 MED ORDER — IOHEXOL 300 MG/ML  SOLN
100.0000 mL | Freq: Once | INTRAMUSCULAR | Status: AC | PRN
  Administered 2015-06-28: 100 mL via INTRAVENOUS

## 2015-06-28 MED ORDER — IOHEXOL 240 MG/ML SOLN: 25.0000 mL | INTRAMUSCULAR | Status: AC

## 2015-06-28 NOTE — Consult Note (Signed)
[facility] Infectious Disease     Reason for Consult: Abd wall abscess   Referring Physician: Job Founds Date of Admission:  06/25/2015   Active Problems:   Wound infection after surgery   HPI: LINVILLE DECAROLIS is a 49 y.o. male with UC s/p colectomy in distant past who underwent ventral hernia repair and placement of retrorectus mesh on 9/19.  Following placement he developed fevers and noted to have some serosang drainage.  Seen in fu 9/24 and had culture done and started on bactrim ds bid.  Cx was negative.  He was seen 10/1 and underwent eval with Dr Tamala Julian and had I and D of approx 60 cc fluids. Culture again was negative. Since admit has been on vancomycin with some improvement.    Past Medical History  Diagnosis Date  . Ulcerative colitis ([place]) 1990  . Bipolar 1 disorder ([place])   . Depression   . History of substance abuse   . Dysrhythmia     "FLUTTERING"  . GERD (gastroesophageal reflux disease)     NO MEDS-WELL-CONTROLLED  . 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 Hypertension 2011    OFF MEDS SINCE 2014-EATING BETTER AND LOST WEIGHT-BP BETTER PER PT  . Gout    Past Surgical History  Procedure Laterality Date  . Hand surgery  2011  . Ileostomy  1990  . Colectomy  1990  . Total colectomy  1990  . Umbilical hernia repair  2001  . Shoulder surgery  2001  . Other surgical history      MULTIPLE ABDOMINAL SURGERIES  . Appendectomy  1990    TAKEN OUT WITH COLECTOMY  . Ventral hernia repair N/A 06/13/2015    Procedure: HERNIA REPAIR VENTRAL ADULT;  Surgeon: Robert Bellow, MD;  Location: [facility] ORS;  Service: General;  Laterality: N/A;   Social History  Substance Use Topics  . Smoking status: Current Every Day Smoker -- 0.50 packs/day for 16 years    Types: Cigarettes  . Smokeless tobacco: Never Used  . Alcohol Use: 0.0 oz/week    0 Standard drinks or equivalent per week     Comment: OCC   Family History  Problem Relation Age of  Onset  . Hyperlipidemia Mother   . Diabetes Father   . Vascular Disease Father   . Alcohol abuse Father   . Depression Father   . Hyperlipidemia Brother   . CVA Maternal Grandfather     Allergies:  Allergies  Allergen Reactions  . Cefaclor Rash  . Ciprofloxacin Rash    Current antibiotics: Antibiotics Given (last 72 hours)    Date/Time Action Medication Dose Rate   06/26/15 0000 Given   vancomycin (VANCOCIN) 1,500 mg in sodium chloride 0.9 % 500 mL IVPB 1,500 mg 250 mL/hr   06/26/15 1129 Given   vancomycin (VANCOCIN) 1,500 mg in sodium chloride 0.9 % 500 mL IVPB 1,500 mg 250 mL/hr   06/26/15 2309 Given   vancomycin (VANCOCIN) 1,500 mg in sodium chloride 0.9 % 500 mL IVPB 1,500 mg 250 mL/hr   06/27/15 1253 Given   vancomycin (VANCOCIN) 1,500 mg in sodium chloride 0.9 % 500 mL IVPB 1,500 mg 250 mL/hr   06/27/15 2335 Given   vancomycin (VANCOCIN) 1,500 mg in sodium chloride 0.9 % 500 mL IVPB 1,500 mg 250 mL/hr   06/28/15 1305 Given   vancomycin (VANCOCIN) 1,500 mg in sodium chloride 0.9 % 500 mL IVPB 1,500 mg 250 mL/hr  MEDICATIONS: . enoxaparin (LOVENOX) injection  40 mg Subcutaneous Q24H  . lidocaine-EPINEPHrine  20 mL Intradermal Once  . QUEtiapine  400 mg Oral QHS  . vancomycin  1,500 mg Intravenous Q12H    Review of Systems - 11 systems reviewed and negative per HPI   OBJECTIVE: Temp:  [97.7 F (36.5 C)-98.5 F (36.9 C)] 97.7 F (36.5 C) (10/04 0433) Pulse Rate:  [80-87] 80 (10/04 0433) Resp:  [16-17] 17 (10/03 2032) BP: (126-140)/(87-93) 127/87 mmHg (10/04 0433) SpO2:  [96 %-99 %] 98 % (10/04 0433) Physical Exam  Constitutional: He is oriented to person, place, and time. He appears well-developed and well-nourished. No distress.  HENT:  Mouth/Throat: Oropharynx is clear and moist. No oropharyngeal exudate.  Cardiovascular: Normal rate, regular rhythm and normal heart sounds. Exam reveals no gallop and no friction rub.  No murmur heard.   Pulmonary/Chest: Effort normal and breath sounds normal. No respiratory distress. He has no wheezes.  Abdominal: Soft. midl distention, midline incision site with approx 1.5 cm opening, probed deep to tissue, sign ss drainage. Mod surrounding induration Lymphadenopathy: He has no cervical adenopathy.  Neurological: He is alert and oriented to person, place, and time.  Skin: Skin is warm and dry. No rash noted. No erythema.  Psychiatric: He has a flat  affect.      LABS: Results for orders placed or performed during the hospital encounter of 06/25/15 (from the past 48 hour(s))  Vancomycin, trough     Status: None   Collection Time: 06/27/15 11:41 AM  Result Value Ref Range   Vancomycin Tr 17 10 - 20 ug/mL  Creatinine, serum     Status: None   Collection Time: 06/27/15 11:41 AM  Result Value Ref Range   Creatinine, Ser 0.89 0.61 - 1.24 mg/dL   GFR calc non Af Amer >60 >60 mL/min   GFR calc Af Amer >60 >60 mL/min    Comment: (NOTE) The eGFR has been calculated using the CKD EPI equation. This calculation has not been validated in all clinical situations. eGFR's persistently <60 mL/min signify possible Chronic Kidney Disease.   CBC with Differential/Platelet     Status: Abnormal   Collection Time: 06/27/15 11:41 AM  Result Value Ref Range   WBC 9.8 3.8 - 10.6 K/uL   RBC 3.85 (L) 4.40 - 5.90 MIL/uL   Hemoglobin 11.6 (L) 13.0 - 18.0 g/dL   HCT 34.0 (L) 40.0 - 52.0 %   MCV 88.5 80.0 - 100.0 fL   MCH 30.1 26.0 - 34.0 pg   MCHC 34.0 32.0 - 36.0 g/dL   RDW 13.8 11.5 - 14.5 %   Platelets 343 150 - 440 K/uL   Neutrophils Relative % 79 %   Neutro Abs 7.8 (H) 1.4 - 6.5 K/uL   Lymphocytes Relative 11 %   Lymphs Abs 1.0 1.0 - 3.6 K/uL   Monocytes Relative 7 %   Monocytes Absolute 0.7 0.2 - 1.0 K/uL   Eosinophils Relative 2 %   Eosinophils Absolute 0.2 0 - 0.7 K/uL   Basophils Relative 1 %   Basophils Absolute 0.1 0 - 0.1 K/uL   No components found for: ESR, C REACTIVE  PROTEIN MICRO: Recent Results (from the past 720 hour(s))  Anaerobic and Aerobic Culture     Status: None   Collection Time: 06/22/15  4:53 PM  Result Value Ref Range Status   Anaerobic Culture Final report  Final   Result 1 Comment  Final    Comment: No anaerobic  growth in 72 hours.   Aerobic Culture Final report  Final   Result 1 Comment  Final    Comment: No growth in 36 - 48 hours.  Culture, blood (routine x 2)     Status: None (Preliminary result)   Collection Time: 06/25/15  4:47 PM  Result Value Ref Range Status   Specimen Description BLOOD RIGHT ARM  Final   Special Requests BOTTLES DRAWN AEROBIC AND ANAEROBIC 5CC  Final   Culture NO GROWTH 2 DAYS  Final   Report Status PENDING  Incomplete  Culture, blood (routine x 2)     Status: None (Preliminary result)   Collection Time: 06/25/15  4:47 PM  Result Value Ref Range Status   Specimen Description BLOOD LEFT ARM  Final   Special Requests BOTTLES DRAWN AEROBIC AND ANAEROBIC 5CC  Final   Culture NO GROWTH 2 DAYS  Final   Report Status PENDING  Incomplete  Wound culture     Status: None (Preliminary result)   Collection Time: 06/25/15  6:58 PM  Result Value Ref Range Status   Specimen Description ABDOMEN  Final   Special Requests Normal  Final   Gram Stain MODERATE WBC SEEN NO ORGANISMS SEEN   Final   Culture NO GROWTH 2 DAYS  Final   Report Status PENDING  Incomplete  Anaerobic culture     Status: None (Preliminary result)   Collection Time: 06/25/15  6:58 PM  Result Value Ref Range Status   Specimen Description ABDOMEN  Final   Special Requests Normal  Final   Culture NO ANAEROBES ISOLATED  Final   Report Status PENDING  Incomplete    IMAGING: Ct Abdomen Pelvis W Contrast  06/28/2015   CLINICAL DATA:  Pt states he had surgery approx. 2 weeks ago. He had pain at incision site, and went to the doctor, where they performed a drainage. HX: Colostomy 1990, HTN, Ulcerative Colitis.  49 y.o. male had surgery some 12 days  ago to repair of multiple ventral hernias. Mesh was placed deep to the rectus muscles. He was kept in the hospital for about 2 nights for pain control. He reports that after discharge did have some drainage from the wound.  EXAM: CT ABDOMEN AND PELVIS WITH CONTRAST  TECHNIQUE: Multidetector CT imaging of the abdomen and pelvis was performed using the standard protocol following bolus administration of intravenous contrast.  CONTRAST:  17m OMNIPAQUE IOHEXOL 300 MG/ML  SOLN  COMPARISON:  None.  FINDINGS: Abdominal wall: There is a defect along the anterior abdominal wall reflecting an open incision from the skin to the deep subcutaneous fat. This is bordered by hazy and reticular type opacity consistent with inflammation. A few small bubbles of air are seen in the deep subcutaneous fat above this, anterior to the superficial muscle fascia. Along the lower central abdominal musculature, there is an ill-defined fluid collection measuring 2.6 x 1.9 cm in greatest transverse dimension. This lies between the rectus abdominus muscles, superficial to the peritoneal cavity and contiguous with the superficial muscle fascia. There is no definite residual hernia. There is no other fluid collection. 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  Gastrointestinal: Patient has had a total colectomy the dissection of the rectum. These findings are stable. Small bowel is normal in caliber with no wall thickening or fold thickening or mesenteric inflammation. Stomach is unremarkable.  Lung bases: Mild dependent subsegmental atelectasis in the lower lobes. Heart normal in size.  Liver: Diffuse fatty infiltration. Normal in size and morphology. No mass or focal lesion.  Spleen, gallbladder, pancreas, adrenal glands:  Unremarkable.  Kidneys, ureters, bladder:  Unremarkable.  Lymph nodes: Few mildly prominent nodes along the gastrohepatic ligament, largest measuring 12 mm in short  axis. These are stable. No other adenopathy.  Ascites:  None.  Musculoskeletal: Mild degenerative changes of the lower thoracic and lower lumbar spine. No osteoblastic or osteolytic lesions.  IMPRESSION: 1. Small ill-defined fluid collection is seen along the deep inferior margin of the anterior abdominal wall incision line measuring 2.6 cm in greatest dimension. This lies between the rectus abdominus muscles. 2. Inflammatory changes are noted along the incision line as well as small bubbles of air. No other fluid collections. 3. No evidence of a recurrent hernia.  No other acute findings. 4. 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   Electronically Signed   By: Lajean Manes M.D.   On: 06/28/2015 12:53    Assessment:   LIN GLAZIER is a 49 y.o. male with post hernia repair infection, mesh in place, cultures negative, with CT showing persistent 2.6 cm fluid collection.  Has been on vanco and has been showing some improvement.  . Given history could be an atypical infection with mycobacteria or fungal.    Recommendations I have swabbed drainage for AFB culture and routine cx and sent to lab Continue vancomycin for now pending culture.   He is allergic to cipro and cefaclor. If decision made to open fluid collection would also send that for culture for AFB, fungal and routine.  Thank you very much for allowing me to participate in the care of this patient. Please call with questions.   Cheral Marker. Ola Spurr, MD

## 2015-06-28 NOTE — Progress Notes (Deleted)
Patient ID: Joshua Mays, male   DOB: Mar 17, 1966, 49 y.o.   MRN: [id number]  Chief Complaint  Patient presents with  . Follow-up    wound infection    HPI Joshua Mays is a 49 y.o. male HPI  Past Medical History  Diagnosis Date  . Ulcerative colitis ([place]) 1990  . Bipolar 1 disorder ([place])   . Depression   . History of substance abuse   . Dysrhythmia     "FLUTTERING"  . GERD (gastroesophageal reflux disease)     NO MEDS-WELL-CONTROLLED  . 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 Hypertension 2011    OFF MEDS SINCE 2014-EATING BETTER AND LOST WEIGHT-BP BETTER PER PT  . Gout     Past Surgical History  Procedure Laterality Date  . Hand surgery  2011  . Ileostomy  1990  . Colectomy  1990  . Total colectomy  1990  . Umbilical hernia repair  2001  . Shoulder surgery  2001  . Other surgical history      MULTIPLE ABDOMINAL SURGERIES  . Appendectomy  1990    TAKEN OUT WITH COLECTOMY  . Ventral hernia repair N/A 06/13/2015    Procedure: HERNIA REPAIR VENTRAL ADULT;  Surgeon: Robert Bellow, MD;  Location: [facility] ORS;  Service: General;  Laterality: N/A;    Family History  Problem Relation Age of Onset  . Hyperlipidemia Mother   . Diabetes Father   . Vascular Disease Father   . Alcohol abuse Father   . Depression Father   . Hyperlipidemia Brother   . CVA Maternal Grandfather     Social History Social History  Substance Use Topics  . Smoking status: Current Every Day Smoker -- 0.50 packs/day for 16 years    Types: Cigarettes  . Smokeless tobacco: Never Used  . Alcohol Use: 0.0 oz/week    0 Standard drinks or equivalent per week     Comment: OCC    Allergies  Allergen Reactions  . Cefaclor Rash  . Ciprofloxacin Rash    No current facility-administered medications for this visit.   No current outpatient prescriptions on file.   Facility-Administered Medications Ordered in Other Visits  Medication Dose Route Frequency  Provider Last Rate Last Dose  . acetaminophen (TYLENOL) tablet 650 mg  650 mg Oral Q6H PRN Leonie Green, MD   650 mg at 06/27/15 1706  . ALPRAZolam Duanne Moron) tablet 0.5 mg  0.5 mg Oral QID PRN Leonie Green, MD   0.5 mg at 06/28/15 0430  . dextrose 5 % and 0.2 % NaCl with KCl 20 mEq infusion   Intravenous Continuous Leonie Green, MD 50 mL/hr at 06/27/15 1706    . enoxaparin (LOVENOX) injection 40 mg  40 mg Subcutaneous Q24H Robert Bellow, MD   40 mg at 06/27/15 1449  . HYDROcodone-acetaminophen (NORCO/VICODIN) 5-325 MG per tablet 1-2 tablet  1-2 tablet Oral Q4H PRN Leonie Green, MD   2 tablet at 06/28/15 0407  . lidocaine-EPINEPHrine (XYLOCAINE W/EPI) 2 %-1:200000 (PF) injection 20 mL  20 mL Intradermal Once Leonie Green, MD   20 mL at 06/25/15 1853  . morphine 2 MG/ML injection 1-4 mg  1-4 mg Intravenous Q1H PRN Leonie Green, MD   4 mg at 06/28/15 6606  . ondansetron (ZOFRAN-ODT) disintegrating tablet 4 mg  4 mg Oral Q6H PRN Leonie Green, MD       Or  . ondansetron [facility])  injection 4 mg  4 mg Intravenous Q6H PRN Leonie Green, MD      . QUEtiapine (SEROQUEL XR) 24 hr tablet 400 mg  400 mg Oral QHS Leonie Green, MD   400 mg at 06/27/15 2100  . vancomycin (VANCOCIN) 1,500 mg in sodium chloride 0.9 % 500 mL IVPB  1,500 mg Intravenous Q12H Leonie Green, MD   1,500 mg at 06/27/15 2335  . zolpidem (AMBIEN) tablet 5 mg  5 mg Oral QHS PRN Leonie Green, MD   5 mg at 06/27/15 2101    Review of Systems Review of Systems  There were no vitals taken for this visit.  Physical Exam Physical Exam  Data Reviewed ***  Assessment    ***    Plan    ***       Joshua Mays 06/28/2015, 8:51 AM

## 2015-06-28 NOTE — Progress Notes (Signed)
This encounter was created in error - please disregard.

## 2015-06-28 NOTE — Progress Notes (Signed)
Afebrile, vital signs stable.   Tolerating diet well. Essentially pain-free.  Examination of the wound shows continued decreased redness. Odorless serous drainage.  Infectious disease has seen the patient, complete consultation pending.  CT of the abdomen shows the vast majority of the inflammatory process to be above the level of the rectus fascia. There is a small area less than 2.5 cm in diameter below the fascia which is likely related to the previously removed drain.  Will discuss wound debridement in the operating room to be sure no loculated purulence remains and consider placement of a wound VAC tomorrow.

## 2015-06-29 ENCOUNTER — Inpatient Hospital Stay: Payer: Medicaid Other | Admitting: Anesthesiology

## 2015-06-29 ENCOUNTER — Encounter
Admission: EM | Disposition: A | Discharge status: Home Health | Payer: Self-pay | Source: Home / Self Care | Attending: General Surgery

## 2015-06-29 DIAGNOSIS — T814XXD Infection following a procedure, subsequent encounter: Secondary | ICD-10-CM

## 2015-06-29 HISTORY — PX: MINOR APPLICATION OF WOUND VAC: SHX6243

## 2015-06-29 HISTORY — PX: WOUND DEBRIDEMENT: SHX247

## 2015-06-29 LAB — ANAEROBIC CULTURE: SPECIAL REQUESTS: NORMAL

## 2015-06-29 LAB — WOUND CULTURE
Culture: NO GROWTH
Special Requests: NORMAL

## 2015-06-29 SURGERY — DEBRIDEMENT, WOUND, ABDOMEN
Anesthesia: General | Wound class: Contaminated

## 2015-06-29 MED ORDER — ONDANSETRON HCL 4 MG/2ML IJ SOLN
INTRAMUSCULAR | Status: DC | PRN
  Administered 2015-06-29: 4 mg via INTRAVENOUS

## 2015-06-29 MED ORDER — PROPOFOL 10 MG/ML IV BOLUS
INTRAVENOUS | Status: DC | PRN
  Administered 2015-06-29: 150 mg via INTRAVENOUS

## 2015-06-29 MED ORDER — LACTATED RINGERS IV SOLN
INTRAVENOUS | Status: DC | PRN
  Administered 2015-06-29: 16:00:00 via INTRAVENOUS

## 2015-06-29 MED ORDER — FENTANYL CITRATE (PF) 100 MCG/2ML IJ SOLN
25.0000 ug | INTRAMUSCULAR | Status: DC | PRN
  Administered 2015-06-29 (×4): 25 ug via INTRAVENOUS

## 2015-06-29 MED ORDER — FENTANYL CITRATE (PF) 100 MCG/2ML IJ SOLN
INTRAMUSCULAR | Status: DC | PRN
  Administered 2015-06-29 (×2): 50 ug via INTRAVENOUS

## 2015-06-29 MED ORDER — ONDANSETRON HCL 4 MG/2ML IJ SOLN: 4.0000 mg | Freq: Once | INTRAMUSCULAR | Status: DC | PRN

## 2015-06-29 MED ORDER — IBUPROFEN 400 MG PO TABS
200.0000 mg | ORAL_TABLET | ORAL | Status: DC | PRN
  Administered 2015-06-30: 200 mg via ORAL
  Filled 2015-06-29: qty 1

## 2015-06-29 MED ORDER — MIDAZOLAM HCL 2 MG/2ML IJ SOLN
INTRAMUSCULAR | Status: DC | PRN
  Administered 2015-06-29: 2 mg via INTRAVENOUS

## 2015-06-29 SURGICAL SUPPLY — 34 items
CANISTER SUCT 1200ML W/VALVE (MISCELLANEOUS) ×3 IMPLANT
CATH TRAY 16F METER LATEX (MISCELLANEOUS) IMPLANT
DRAPE INCISE IOBAN 66X45 STRL (DRAPES) IMPLANT
DRAPE LAPAROTOMY 100X77 ABD (DRAPES) IMPLANT
DRAPE LAPAROTOMY T 102X78X121 (DRAPES) ×3 IMPLANT
DRSG VAC ATS MED SENSATRAC (GAUZE/BANDAGES/DRESSINGS) ×3 IMPLANT
ELECT CAUTERY NEEDLE TIP 1.0 (MISCELLANEOUS) ×3
ELECTRODE CAUTERY NEDL TIP 1.0 (MISCELLANEOUS) ×1 IMPLANT
GAUZE SPONGE 4X4 12PLY STRL (GAUZE/BANDAGES/DRESSINGS) IMPLANT
GLOVE BIO SURGEON STRL SZ7 (GLOVE) ×6 IMPLANT
GLOVE INDICATOR 7.5 STRL GRN (GLOVE) ×3 IMPLANT
GLOVE INDICATOR 8.0 STRL GRN (GLOVE) ×3 IMPLANT
GLOVE SURG LX 7.5 STRW (GLOVE) ×2
GLOVE SURG LX STRL 7.5 STRW (GLOVE) ×1 IMPLANT
GOWN STRL REUS W/ TWL LRG LVL3 (GOWN DISPOSABLE) ×2 IMPLANT
GOWN STRL REUS W/ TWL XL LVL3 (GOWN DISPOSABLE) IMPLANT
GOWN STRL REUS W/TWL LRG LVL3 (GOWN DISPOSABLE) ×4
GOWN STRL REUS W/TWL XL LVL3 (GOWN DISPOSABLE)
KIT RM TURNOVER STRD PROC AR (KITS) ×3 IMPLANT
LABEL OR SOLS (LABEL) IMPLANT
NS IRRIG 1000ML POUR BTL (IV SOLUTION) ×3 IMPLANT
PACK BASIN MAJOR ARMC (MISCELLANEOUS) ×3 IMPLANT
PACK BASIN MINOR ARMC (MISCELLANEOUS) ×3 IMPLANT
PAD ABD DERMACEA PRESS 5X9 (GAUZE/BANDAGES/DRESSINGS) IMPLANT
PAD GROUND ADULT SPLIT (MISCELLANEOUS) ×3 IMPLANT
PAD NEG PRESSURE SENSATRAC (MISCELLANEOUS) ×3 IMPLANT
RELOAD STAPLE SKIN SM 35W (MISCELLANEOUS) IMPLANT
SOL PREP PVP 2OZ (MISCELLANEOUS) ×3
SOLUTION PREP PVP 2OZ (MISCELLANEOUS) ×1 IMPLANT
SPONGE LAP 18X18 5 PK (GAUZE/BANDAGES/DRESSINGS) ×3 IMPLANT
SUT CHROMIC 0 CT 1 (SUTURE) IMPLANT
SUT CHROMIC BR 1/2CLE 2-0 54IN (SUTURE) IMPLANT
SUT MAXON ABS #0 GS21 30IN (SUTURE) IMPLANT
WND VAC CANISTER 500ML (MISCELLANEOUS) ×3 IMPLANT

## 2015-06-29 NOTE — Anesthesia Procedure Notes (Signed)
Procedure Name: LMA Insertion Date/Time: 06/29/2015 3:41 PM Performed by: Jonna Clark Pre-anesthesia Checklist: Patient identified, Patient being monitored, Timeout performed, Emergency Drugs available and Suction available Patient Re-evaluated:Patient Re-evaluated prior to inductionOxygen Delivery Method: Circle system utilized Preoxygenation: Pre-oxygenation with 100% oxygen Intubation Type: IV induction Ventilation: Mask ventilation without difficulty LMA: LMA inserted LMA Size: 4.5 Tube type: Oral Number of attempts: 1 Placement Confirmation: positive ETCO2 and breath sounds checked- equal and bilateral Tube secured with: Tape Dental Injury: Teeth and Oropharynx as per pre-operative assessment

## 2015-06-29 NOTE — Transfer of Care (Signed)
Immediate Anesthesia Transfer of Care Note  Patient: Joshua Mays  Procedure(s) Performed: Procedure(s): DEBRIDEMENT ABDOMINAL WOUND (N/A) MINOR APPLICATION OF WOUND VAC (N/A)  Patient Location: PACU  Anesthesia Type:General  Level of Consciousness: awake, alert  and oriented  Airway & Oxygen Therapy: Patient Spontanous Breathing and Patient connected to face mask oxygen  Post-op Assessment: Report given to RN and Post -op Vital signs reviewed and stable  Post vital signs: Reviewed and stable  Last Vitals:  Filed Vitals:   06/29/15 1611  BP: 127/87  Pulse: 85  Temp: 37.3 C  Resp: 18    Complications: No apparent anesthesia complications

## 2015-06-29 NOTE — Progress Notes (Signed)
[place] INFECTIOUS DISEASE PROGRESS NOTE Date of Admission:  06/25/2015     ID: KAYLUM SHRUM is a 49 y.o. male with Post op hernia repair infection  Active Problems:   Wound infection after surgery   Subjective: Cx with Staph aureus from 10/4.  No fevers  ROS  Eleven systems are reviewed and negative except per hpi  Medications:  Antibiotics Given (last 72 hours)    Date/Time Action Medication Dose Rate   06/26/15 2309 Given   vancomycin (VANCOCIN) 1,500 mg in sodium chloride 0.9 % 500 mL IVPB 1,500 mg 250 mL/hr   06/27/15 1253 Given   vancomycin (VANCOCIN) 1,500 mg in sodium chloride 0.9 % 500 mL IVPB 1,500 mg 250 mL/hr   06/27/15 2335 Given   vancomycin (VANCOCIN) 1,500 mg in sodium chloride 0.9 % 500 mL IVPB 1,500 mg 250 mL/hr   06/28/15 1305 Given   vancomycin (VANCOCIN) 1,500 mg in sodium chloride 0.9 % 500 mL IVPB 1,500 mg 250 mL/hr   06/28/15 2310 Given   vancomycin (VANCOCIN) 1,500 mg in sodium chloride 0.9 % 500 mL IVPB 1,500 mg 250 mL/hr   06/29/15 1240 Given   vancomycin (VANCOCIN) 1,500 mg in sodium chloride 0.9 % 500 mL IVPB 1,500 mg 250 mL/hr     . enoxaparin (LOVENOX) injection  40 mg Subcutaneous Q24H  . lidocaine-EPINEPHrine  20 mL Intradermal Once  . QUEtiapine  400 mg Oral QHS  . vancomycin  1,500 mg Intravenous Q12H    Objective: Vital signs in last 24 hours: Temp:  [97.7 F (36.5 C)-98.2 F (36.8 C)] 97.7 F (36.5 C) (10/05 1241) Pulse Rate:  [78-89] 80 (10/05 1241) Resp:  [18] 18 (10/05 1241) BP: (108-138)/(71-94) 138/94 mmHg (10/05 1241) SpO2:  [95 %-99 %] 97 % (10/05 1241) Constitutional: He is oriented to person, place, and time. He appears well-developed and well-nourished. No distress.  HENT:  Mouth/Throat: Oropharynx is clear and moist. No oropharyngeal exudate.  Cardiovascular: Normal rate, regular rhythm and normal heart sounds. Exam reveals no gallop and no friction rub.  No murmur heard.  Pulmonary/Chest: Effort  normal and breath sounds normal. No respiratory distress. He has no wheezes.  Abdominal: Soft. midl distention, midline incision site with approx 1.5 cm opening, probed deep to tissue, sign ss drainage. Mod surrounding induration Lymphadenopathy: He has no cervical adenopathy.  Neurological: He is alert and oriented to person, place, and time.  Skin: Skin is warm and dry. No rash noted. No erythema.  Psychiatric: He has a flat affect.   Lab Results  Recent Labs  06/27/15 1141  WBC 9.8  HGB 11.6*  HCT 34.0*  CREATININE 0.89    Microbiology: Results for orders placed or performed during the hospital encounter of 06/25/15  Culture, blood (routine x 2)     Status: None (Preliminary result)   Collection Time: 06/25/15  4:47 PM  Result Value Ref Range Status   Specimen Description BLOOD RIGHT ARM  Final   Special Requests BOTTLES DRAWN AEROBIC AND ANAEROBIC 5CC  Final   Culture NO GROWTH 4 DAYS  Final   Report Status PENDING  Incomplete  Culture, blood (routine x 2)     Status: None (Preliminary result)   Collection Time: 06/25/15  4:47 PM  Result Value Ref Range Status   Specimen Description BLOOD LEFT ARM  Final   Special Requests BOTTLES DRAWN AEROBIC AND ANAEROBIC 5CC  Final   Culture NO GROWTH 4 DAYS  Final   Report Status PENDING  Incomplete  Wound culture     Status: None   Collection Time: 06/25/15  6:58 PM  Result Value Ref Range Status   Specimen Description ABDOMEN  Final   Special Requests Normal  Final   Gram Stain MODERATE WBC SEEN NO ORGANISMS SEEN   Final   Culture NO GROWTH 4 DAYS  Final   Report Status 06/29/2015 FINAL  Final  Anaerobic culture     Status: None   Collection Time: 06/25/15  6:58 PM  Result Value Ref Range Status   Specimen Description ABDOMEN  Final   Special Requests Normal  Final   Culture NO ANAEROBES ISOLATED  Final   Report Status 06/29/2015 FINAL  Final  Wound culture     Status: None (Preliminary result)   Collection Time:  06/28/15  2:33 PM  Result Value Ref Range Status   Specimen Description WOUND  Final   Special Requests Normal  Final   Gram Stain PENDING  Incomplete   Culture   Final    LIGHT GROWTH STAPHYLOCOCCUS AUREUS SUSCEPTIBILITIES TO FOLLOW    Report Status PENDING  Incomplete    Studies/Results: Ct Abdomen Pelvis W Contrast  06/28/2015   CLINICAL DATA:  Pt states he had surgery approx. 2 weeks ago. He had pain at incision site, and went to the doctor, where they performed a drainage. HX: Colostomy 1990, HTN, Ulcerative Colitis.  49 y.o. male had surgery some 12 days ago to repair of multiple ventral hernias. Mesh was placed deep to the rectus muscles. He was kept in the hospital for about 2 nights for pain control. He reports that after discharge did have some drainage from the wound.  EXAM: CT ABDOMEN AND PELVIS WITH CONTRAST  TECHNIQUE: Multidetector CT imaging of the abdomen and pelvis was performed using the standard protocol following bolus administration of intravenous contrast.  CONTRAST:  156mL OMNIPAQUE IOHEXOL 300 MG/ML  SOLN  COMPARISON:  None.  FINDINGS: Abdominal wall: There is a defect along the anterior abdominal wall reflecting an open incision from the skin to the deep subcutaneous fat. This is bordered by hazy and reticular type opacity consistent with inflammation. A few small bubbles of air are seen in the deep subcutaneous fat above this, anterior to the superficial muscle fascia. Along the lower central abdominal musculature, there is an ill-defined fluid collection measuring 2.6 x 1.9 cm in greatest transverse dimension. This lies between the rectus abdominus muscles, superficial to the peritoneal cavity and contiguous with the superficial muscle fascia. There is no definite residual hernia. There is no other fluid collection. 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 These findings are stable. Small bowel is normal in caliber with no wall thickening or fold thickening or mesenteric inflammation. Stomach is unremarkable.  Lung bases: Mild dependent subsegmental atelectasis in the lower lobes. Heart normal in size.  Liver: Diffuse fatty infiltration. Normal in size and morphology. No mass or focal lesion.  Spleen, gallbladder, pancreas, adrenal glands:  Unremarkable.  Kidneys, ureters, bladder:  Unremarkable.  Lymph nodes: Few mildly prominent nodes along the gastrohepatic ligament, largest measuring 12 mm in short axis. These are stable. No other adenopathy.  Ascites:  None.  Musculoskeletal: Mild degenerative changes of the lower thoracic and lower lumbar spine. No osteoblastic or osteolytic lesions.  IMPRESSION: 1. Small ill-defined fluid collection is seen along the deep inferior margin of the  anterior abdominal wall incision line measuring 2.6 cm in greatest dimension. This lies between the rectus abdominus muscles. 2. Inflammatory changes are noted along the incision line as well as small bubbles of air. No other fluid collections. 3. No evidence of a recurrent hernia.  No other acute findings. 4. 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   Electronically Signed   By: Lajean Manes M.D.   On: 06/28/2015 12:53    Assessment/Plan: DAWAUN BRANCATO is a 49 y.o. male with post hernia repair infection, mesh in place, cultures negative, with CT showing persistent 2.6 cm fluid collection. Has been on vanco and has been showing some improvement. Given history could be an atypical infection with mycobacteria or fungal.  Cx growing Staph aureus Recommendations Continue vancomycin for now pending sensitivities He is allergic to cipro and cefaclor. For repeat drainage today - if good  source control achieved can likely dc on oral abx instead of IV Thank you very much for the consult. Will follow with you.  [place], [place]   06/29/2015, 2:48 PM

## 2015-06-29 NOTE — Care Management (Signed)
Demographics and H&P faxed to [employer] from [facility]  Still awaiting op note in EPIC

## 2015-06-29 NOTE — Progress Notes (Signed)
Paged Dr. Bary Castilla twice about pt not having an order for fluids. No call back was received.

## 2015-06-29 NOTE — Anesthesia Preprocedure Evaluation (Deleted)
Anesthesia Evaluation    Airway        Dental   Pulmonary Current Smoker,           Cardiovascular hypertension,      Neuro/Psych    GI/Hepatic   Endo/Other    Renal/GU      Musculoskeletal   Abdominal   Peds  Hematology   Anesthesia Other Findings   Reproductive/Obstetrics                             Anesthesia Physical Anesthesia Plan Anesthesia Quick Evaluation  

## 2015-06-29 NOTE — Op Note (Signed)
Preoperative diagnosis: Wound infection.  Postoperative diagnosis: Same with extension to mesh.  Operative procedure: Wound debridement with application of wound VAC.  Operating surgeon: Ollen Bowl, M.D.  Anesthesia: Gen. by LMA.  Estimate blood loss: 10 mL  Clinical note This 49 year old male underwent repair of multiple ventral abdominal hernias with placement of a retrorectus polypropylene mesh 2 weeks ago. On postoperative follow-up day 7 he presented with obvious at gross superficial wound infection. Culture had been negative. He was placed on oral Bactrim. He was suddenly admitted for IV antibiotic. CT scan yesterday showed majority of the inflammatory process above the level of the rectus fascia with only a tiny fluid collection below the fascia. He is brought to the operative for planned wide debridement and wound VAC application.  Operative note: With the patient under adequate general anesthesia the abdomen was prepped with Betadine solution and draped. The ileostomy was bracketed with a OpSite dressing. A curet was used on the granulation tissue after the wound was opened in its entirety. At the inferior aspect a 2 x 3 cm area of the retrorectus mesh was exposed. No purulence in this area. Good adherence of the mesh to the posterior rectus sheath. This was not removed. The wound was irrigated with saline. A wound VAC dressing was placed and a good seal obtained at 125 mmHg suction.  The patient was taken to recovery in stable condition.

## 2015-06-29 NOTE — Progress Notes (Signed)
AVSS.  ID assistance appreciated.  For wound drainage today. Suture reviewed. Possiblility of  wound VAC discussed

## 2015-06-29 NOTE — Care Management Note (Signed)
Case Management Note  Patient Details  Name: Joshua Mays MRN: [id number] Date of Birth: 1965/12/26  Subjective/Objective:                 Patient returned from OR with wound vac in place.    Action/Plan: Dr. Bary Castilla notified of need for [facility] authorization form to be completed.  Forms left on chart.  Left message for Joshua Mays.  Expected Discharge Date:                  Expected Discharge Plan:     In-House Referral:     Discharge planning Services     Post Acute Care Choice:    Choice offered to:     DME Arranged:    DME Agency:     HH Arranged:    [place] Agency:     Status of Service:     Medicare Important Message Given:    Date Medicare IM Given:    Medicare IM give by:    Date Additional Medicare IM Given:    Additional Medicare Important Message give by:     If discussed at [place] of Stay Meetings, dates discussed:    Additional Comments:  Joshua Sessions, RN 06/29/2015, 11:21 AM

## 2015-06-29 NOTE — Anesthesia Preprocedure Evaluation (Signed)
Anesthesia Evaluation  Patient identified by MRN, date of birth, ID band Patient awake    Reviewed: Allergy & Precautions, NPO status , Patient's Chart, lab work & pertinent test results  History of Anesthesia Complications Negative for: history of anesthetic complications  Airway Mallampati: III  TM Distance: >3 FB Neck ROM: Full    Dental  (+) Teeth Intact   Pulmonary Current Smoker (1ppd),    breath sounds clear to auscultation       Cardiovascular Exercise Tolerance: Good hypertension, Pt. on medications  Rhythm:Regular Rate:Normal     Neuro/Psych Depression Bipolar Disorder negative neurological ROS     GI/Hepatic Denies problems with GERD today, is fasted.   Endo/Other    Renal/GU      Musculoskeletal   Abdominal (+) + obese,  Abdomen: soft.    Peds  Hematology   Anesthesia Other Findings   Reproductive/Obstetrics                             Anesthesia Physical  Anesthesia Plan  ASA: III  Anesthesia Plan: General   Post-op Pain Management:    Induction: Intravenous  Airway Management Planned: LMA  Additional Equipment:   Intra-op Plan:   Post-operative Plan: Extubation in OR  Informed Consent: I have reviewed the patients History and Physical, chart, labs and discussed the procedure including the risks, benefits and alternatives for the proposed anesthesia with the patient or authorized representative who has indicated his/her understanding and acceptance.     Plan Discussed with: CRNA  Anesthesia Plan Comments:         Anesthesia Quick Evaluation

## 2015-06-30 ENCOUNTER — Encounter: Payer: Self-pay | Admitting: General Surgery

## 2015-06-30 LAB — CULTURE, BLOOD (ROUTINE X 2)
Culture: NO GROWTH
Culture: NO GROWTH

## 2015-06-30 LAB — CREATININE, SERUM
CREATININE: 1.16 mg/dL (ref 0.61–1.24)
GFR calc Af Amer: >60 mL/min (ref >60)

## 2015-06-30 LAB — VANCOMYCIN, TROUGH
VANCOMYCIN TR: 13 ug/mL (ref 10–20)
Vancomycin Tr: 24 ug/mL (ref 10–20)

## 2015-06-30 MED ORDER — RIFAMPIN 300 MG PO CAPS
600.0000 mg | ORAL_CAPSULE | Freq: Every day | ORAL | Status: DC
  Administered 2015-06-30 – 2015-07-04 (×5): 600 mg via ORAL
  Filled 2015-06-30 (×5): qty 2

## 2015-06-30 MED ORDER — VANCOMYCIN HCL 10 G IV SOLR
1250.0000 mg | Freq: Two times a day (BID) | INTRAVENOUS | Status: DC
  Administered 2015-06-30 – 2015-07-01 (×2): 1250 mg via INTRAVENOUS
  Filled 2015-06-30 (×6): qty 1250

## 2015-06-30 MED ORDER — MELOXICAM 7.5 MG PO TABS
15.0000 mg | ORAL_TABLET | Freq: Every day | ORAL | Status: DC
  Administered 2015-06-30 – 2015-07-04 (×5): 15 mg via ORAL
  Filled 2015-06-30 (×5): qty 2

## 2015-06-30 NOTE — Care Management (Signed)
To room to discuss wound vac and home health .  Patient stated that he had no idea that he would be going home with a wound vac and that he did not want to discuss home health at this time. He stated that he did not want to go home with a wound vac. Patient became upset and stated that he didn't understand how a small operation like a hernia repair turned into having an open wound and having to go home with equipment that he did not know how to take care of and also having a nurse come to his home . He kept referencing [facility] and stating that he should have went to [facility].  Patient kept stating that he did not know what to do. He stated that he wanted the Dr to just stitch him back up and that he would go home and take his chances. Patient stated that his gout was now flairing up and that he was going to be sent home on no pain medication. He stated that he was not even getting any antibiotics. I explained to patient that he was getting treatment for his wound and that I would be more than happy to contact Dr Doreatha Lew to relay his concerns. I stated that this would be better discussed with his MD. Patient continued to ventilate frustration and began to cry. I asked patient if he would like to speak with someone from pastoral care. He refused " I don't know what good that would do." I stayed with patient until he had calmed and left room.   I contacted Dr Bary Castilla and discussed patient concerns in detail. Dr Bary Castilla stated he would contact the patient to discuss.   Unable to setup home health at this time will discuss tomorrow.  Received call from [place] at [place] will be delivered to room tomorrow between 9 and 1400.

## 2015-06-30 NOTE — Progress Notes (Signed)
ANTIBIOTIC CONSULT NOTE - INITIAL  Pharmacy Consult for vancomycin Indication: wound infection/abscess  Allergies  Allergen Reactions  . Cefaclor Rash  . Ciprofloxacin Rash    Patient Measurements: Height: 5\' 11"  (180.3 cm) Weight: 207 lb 14.4 oz (94.303 kg) IBW/kg (Calculated) : 75.3 Adjusted Body Weight: 82.8 kg  Vital Signs: Temp: 97.9 F (36.6 C) (10/06 1308) Temp Source: Oral (10/06 1308) BP: 133/79 mmHg (10/06 1308) Pulse Rate: 116 (10/06 1308) Intake/Output from previous day: 10/05 0701 - 10/06 0700 In: 1499 [I.V.:1499] Out: 10 [Blood:10] Intake/Output from this shift: Total I/O In: 720 [P.O.:720] Out: 0   Labs:  Recent Labs  06/30/15 1051  CREATININE 1.16   Estimated Creatinine Clearance: 90.3 mL/min (by C-G formula based on Cr of 1.16).  Recent Labs  06/30/15 1051 06/30/15 1731  VANCOTROUGH 24* 13     Microbiology: Recent Results (from the past 720 hour(s))  Anaerobic and Aerobic Culture     Status: None   Collection Time: 06/22/15  4:53 PM  Result Value Ref Range Status   Anaerobic Culture Final report  Final   Result 1 Comment  Final    Comment: No anaerobic growth in 72 hours.   Aerobic Culture Final report  Final   Result 1 Comment  Final    Comment: No growth in 36 - 48 hours.  Culture, blood (routine x 2)     Status: None   Collection Time: 06/25/15  4:47 PM  Result Value Ref Range Status   Specimen Description BLOOD RIGHT ARM  Final   Special Requests BOTTLES DRAWN AEROBIC AND ANAEROBIC 5CC  Final   Culture NO GROWTH 5 DAYS  Final   Report Status 06/30/2015 FINAL  Final  Culture, blood (routine x 2)     Status: None   Collection Time: 06/25/15  4:47 PM  Result Value Ref Range Status   Specimen Description BLOOD LEFT ARM  Final   Special Requests BOTTLES DRAWN AEROBIC AND ANAEROBIC 5CC  Final   Culture NO GROWTH 5 DAYS  Final   Report Status 06/30/2015 FINAL  Final  Wound culture     Status: None   Collection Time: 06/25/15   6:58 PM  Result Value Ref Range Status   Specimen Description ABDOMEN  Final   Special Requests Normal  Final   Gram Stain MODERATE WBC SEEN NO ORGANISMS SEEN   Final   Culture NO GROWTH 4 DAYS  Final   Report Status 06/29/2015 FINAL  Final  Anaerobic culture     Status: None   Collection Time: 06/25/15  6:58 PM  Result Value Ref Range Status   Specimen Description ABDOMEN  Final   Special Requests Normal  Final   Culture NO ANAEROBES ISOLATED  Final   Report Status 06/29/2015 FINAL  Final  Wound culture     Status: None (Preliminary result)   Collection Time: 06/28/15  2:33 PM  Result Value Ref Range Status   Specimen Description WOUND  Final   Special Requests Normal  Final   Gram Stain FEW WBC SEEN FEW GRAM POSITIVE COCCI   Final   Culture   Final    LIGHT GROWTH STAPHYLOCOCCUS AUREUS INDUCIBLE CLINDAMYCIN RESISTANCE - A positive ICR test is indicative of inducible resistance to macrolides, lincosamides, and type B streptogramin.  This isolate is presumed to be resistant to Clindamycin, however, Clindamycin may still be effective in  some patients.      Report Status PENDING  Incomplete   Organism ID, Bacteria  STAPHYLOCOCCUS AUREUS  Final      Susceptibility   Staphylococcus aureus - MIC*    CIPROFLOXACIN <=0.5 SENSITIVE Sensitive     ERYTHROMYCIN >=8 RESISTANT Resistant     GENTAMICIN <=0.5 SENSITIVE Sensitive     OXACILLIN 0.5 SENSITIVE Sensitive     TRIMETH/SULFA <=10 SENSITIVE Sensitive     CLINDAMYCIN <=0.25 RESISTANT Resistant     CEFOXITIN SCREEN NEGATIVE Sensitive     Inducible Clindamycin Value in next row Resistant      POSITIVEINDUCIBLE CLINDAMYCIN RESISTANCE - A positive ICR test is indicative of inducible resistance to macrolides, lincosamides, and type B streptogramin.  This isolate is presumed to be resistant to Clindamycin, however, Clindamycin may still be effective in some patients.     * LIGHT GROWTH STAPHYLOCOCCUS AUREUS    Medical History: Past  Medical History  Diagnosis Date  . Ulcerative colitis ([place]) 1990  . Bipolar 1 disorder ([place])   . Depression   . History of substance abuse   . Dysrhythmia     "FLUTTERING"  . GERD (gastroesophageal reflux disease)     NO MEDS-WELL-CONTROLLED  . 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 Hypertension 2011    OFF MEDS SINCE 2014-EATING BETTER AND LOST WEIGHT-BP BETTER PER PT  . Gout     Medications:  Infusions:    Assessment: 48 yom sp ventral hernia repairs approx 12 days ago. After discharge did have some discharge from wound. Started bactrim outpatient. Now presents with increased pain/weakness/chills/low-grade fever. Leukocytosis in ED, starting vancomycin for wound infection/abscess.   Vd 58 L, Ke 0.089 hr-1, T1/2 7.8 hr, predicted trough 15 mcg/mL  Goal of Therapy:  Vancomycin trough level 15-20 mcg/ml  Plan:  Expected duration 7 days with resolution of temperature and/or normalization of WBC. Vancomycin 1.5 gm IV Q12H (received 1 gm in ED at 1800 this evening) with stacked dosing, second dose 6 hours after first. Will continue to follow and adjust as needed to maintain trough 15 to 20 mcg/mL.  10/3- Vanc trough = 17 Continue current dose 1.5g q 12 hours. Will recheck trough in 3 days  10/6 Vanc trough =24 Will check Scr now, will hold 1200 dose. Redraw level in 6 hours. If level is between 15-20, will restart at 1500mg  q 18 hours.  10/6  Vanc trough @ 17:30 = 13 mcg/mL Will adjust dose to Vancomycin 1250 mg IV Q12H to start 10/6 @ 19:00. Will draw next trough before 3rd new dose on 10/7 @ 18:30.   Darshan Solanki D, Pharm.D. Clinical Pharmacist 06/30/2015,6:42 PM

## 2015-06-30 NOTE — Progress Notes (Signed)
ID E note Reviewed Op note- mesh was exposed but was well incorporated and no evidence of purulence at mesh.  It was left in place. Wound vac placed  Cx is growing MSSA but he is allergic to cephalosporins. Sensitive to bactrim He has been on vancomycin.   At this point would continue vanco until ready for dc then would change to oral bactrim but at higher oral dose of 2 DS tablets BID. Will add rifampin for synergy  Given retained foreign material

## 2015-06-30 NOTE — Anesthesia Postprocedure Evaluation (Signed)
  Anesthesia Post-op Note  Patient: Joshua Mays  Procedure(s) Performed: Procedure(s): DEBRIDEMENT ABDOMINAL WOUND (N/A) MINOR APPLICATION OF WOUND VAC (N/A)  Anesthesia type:General  Patient location: PACU  Post pain: Pain level controlled  Post assessment: Post-op Vital signs reviewed, Patient's Cardiovascular Status Stable, Respiratory Function Stable, Patent Airway and No signs of Nausea or vomiting  Post vital signs: Reviewed and stable  Last Vitals:  Filed Vitals:   06/30/15 0559  BP: 101/53  Pulse: 89  Temp: 36.3 C  Resp: 18    Level of consciousness: awake, alert  and patient cooperative  Complications: No apparent anesthesia complications

## 2015-06-30 NOTE — Progress Notes (Signed)
Afebrile, vital signs stable. Pain control was plus minus yesterday evening. Better to this morning. I reviewed with him his pain will decreased markedly and all of the debridement is complete. New complaint: Left great toe pain, similar to what he experienced in the right great toe one month ago when he was found to have a minimally elevated serum uric acid at his PCP office of 7.7 per sees upper limit of normal 7.6) sees. Treated with ibuprofen.  Abdominal wound shows a rim of erythema, otherwise unremarkable. Bloody drainage in the wound VAC apparatus.  Reviewed with the patient the small area of exposed mesh may result in ultimate mess wash.  Plan for wound VAC dressing change tomorrow under sedation.

## 2015-06-30 NOTE — Progress Notes (Signed)
ANTIBIOTIC CONSULT NOTE - INITIAL  Pharmacy Consult for vancomycin Indication: wound infection/abscess  Allergies  Allergen Reactions  . Cefaclor Rash  . Ciprofloxacin Rash    Patient Measurements: Height: 5\' 11"  (180.3 cm) Weight: 207 lb 14.4 oz (94.303 kg) IBW/kg (Calculated) : 75.3 Adjusted Body Weight: 82.8 kg  Vital Signs: Temp: 97.4 F (36.3 C) (10/06 0559) Temp Source: Oral (10/06 0559) BP: 101/53 mmHg (10/06 0559) Pulse Rate: 89 (10/06 0559) Intake/Output from previous day: 10/05 0701 - 10/06 0700 In: 1610 [I.V.:1499] Out: 10 [Blood:10] Intake/Output from this shift: Total I/O In: 720 [P.O.:720] Out: 0   Labs: No results for input(s): WBC, HGB, PLT, LABCREA, CREATININE in the last 72 hours. Estimated Creatinine Clearance: 117.7 mL/min (by C-G formula based on Cr of 0.89).  Recent Labs  06/30/15 1051  Terminous 24*     Microbiology: Recent Results (from the past 720 hour(s))  Anaerobic and Aerobic Culture     Status: None   Collection Time: 06/22/15  4:53 PM  Result Value Ref Range Status   Anaerobic Culture Final report  Final   Result 1 Comment  Final    Comment: No anaerobic growth in 72 hours.   Aerobic Culture Final report  Final   Result 1 Comment  Final    Comment: No growth in 36 - 48 hours.  Culture, blood (routine x 2)     Status: None   Collection Time: 06/25/15  4:47 PM  Result Value Ref Range Status   Specimen Description BLOOD RIGHT ARM  Final   Special Requests BOTTLES DRAWN AEROBIC AND ANAEROBIC 5CC  Final   Culture NO GROWTH 5 DAYS  Final   Report Status 06/30/2015 FINAL  Final  Culture, blood (routine x 2)     Status: None   Collection Time: 06/25/15  4:47 PM  Result Value Ref Range Status   Specimen Description BLOOD LEFT ARM  Final   Special Requests BOTTLES DRAWN AEROBIC AND ANAEROBIC 5CC  Final   Culture NO GROWTH 5 DAYS  Final   Report Status 06/30/2015 FINAL  Final  Wound culture     Status: None   Collection Time:  06/25/15  6:58 PM  Result Value Ref Range Status   Specimen Description ABDOMEN  Final   Special Requests Normal  Final   Gram Stain MODERATE WBC SEEN NO ORGANISMS SEEN   Final   Culture NO GROWTH 4 DAYS  Final   Report Status 06/29/2015 FINAL  Final  Anaerobic culture     Status: None   Collection Time: 06/25/15  6:58 PM  Result Value Ref Range Status   Specimen Description ABDOMEN  Final   Special Requests Normal  Final   Culture NO ANAEROBES ISOLATED  Final   Report Status 06/29/2015 FINAL  Final  Wound culture     Status: None (Preliminary result)   Collection Time: 06/28/15  2:33 PM  Result Value Ref Range Status   Specimen Description WOUND  Final   Special Requests Normal  Final   Gram Stain FEW WBC SEEN FEW GRAM POSITIVE COCCI   Final   Culture   Final    LIGHT GROWTH STAPHYLOCOCCUS AUREUS INDUCIBLE CLINDAMYCIN RESISTANCE - A positive ICR test is indicative of inducible resistance to macrolides, lincosamides, and type B streptogramin.  This isolate is presumed to be resistant to Clindamycin, however, Clindamycin may still be effective in  some patients.      Report Status PENDING  Incomplete   Organism ID, Bacteria  STAPHYLOCOCCUS AUREUS  Final      Susceptibility   Staphylococcus aureus - MIC*    CIPROFLOXACIN <=0.5 SENSITIVE Sensitive     ERYTHROMYCIN >=8 RESISTANT Resistant     GENTAMICIN <=0.5 SENSITIVE Sensitive     OXACILLIN 0.5 SENSITIVE Sensitive     TRIMETH/SULFA <=10 SENSITIVE Sensitive     CLINDAMYCIN <=0.25 RESISTANT Resistant     CEFOXITIN SCREEN NEGATIVE Sensitive     Inducible Clindamycin Value in next row Resistant      POSITIVEINDUCIBLE CLINDAMYCIN RESISTANCE - A positive ICR test is indicative of inducible resistance to macrolides, lincosamides, and type B streptogramin.  This isolate is presumed to be resistant to Clindamycin, however, Clindamycin may still be effective in some patients.     * LIGHT GROWTH STAPHYLOCOCCUS AUREUS    Medical  History: Past Medical History  Diagnosis Date  . Ulcerative colitis ([place]) 1990  . Bipolar 1 disorder ([place])   . Depression   . History of substance abuse   . Dysrhythmia     "FLUTTERING"  . GERD (gastroesophageal reflux disease)     NO MEDS-WELL-CONTROLLED  . 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 Hypertension 2011    OFF MEDS SINCE 2014-EATING BETTER AND LOST WEIGHT-BP BETTER PER PT  . Gout     Medications:  Infusions:    Assessment: 48 yom sp ventral hernia repairs approx 12 days ago. After discharge did have some discharge from wound. Started bactrim outpatient. Now presents with increased pain/weakness/chills/low-grade fever. Leukocytosis in ED, starting vancomycin for wound infection/abscess.   Vd 58 L, Ke 0.089 hr-1, T1/2 7.8 hr, predicted trough 15 mcg/mL  Goal of Therapy:  Vancomycin trough level 15-20 mcg/ml  Plan:  Expected duration 7 days with resolution of temperature and/or normalization of WBC. Vancomycin 1.5 gm IV Q12H (received 1 gm in ED at 1800 this evening) with stacked dosing, second dose 6 hours after first. Will continue to follow and adjust as needed to maintain trough 15 to 20 mcg/mL.  10/3- Vanc trough = 17 Continue current dose 1.5g q 12 hours. Will recheck trough in 3 days  10/6 Vanc trough =24 Will check Scr now, will hold 1200 dose. Redraw level in 6 hours. If level is between 15-20, will restart at 1500mg  q 18 hours.  Ramond Dial, Pharm.D. Clinical Pharmacist 06/30/2015,11:41 AM

## 2015-06-30 NOTE — Care Management (Signed)
Discussed case with Dr Bary Castilla. Orders for home wound vac placed with [employer]. Joshua Mays) Patient will have dressing change and debridement tomorrow. Continues with IVABX.   Continue to follow

## 2015-07-01 ENCOUNTER — Inpatient Hospital Stay: Payer: Medicaid Other | Admitting: Certified Registered Nurse Anesthetist

## 2015-07-01 ENCOUNTER — Encounter
Admission: EM | Disposition: A | Discharge status: Home Health | Payer: Self-pay | Source: Home / Self Care | Attending: General Surgery

## 2015-07-01 DIAGNOSIS — F1010 Alcohol abuse, uncomplicated: Secondary | ICD-10-CM

## 2015-07-01 DIAGNOSIS — F603 Borderline personality disorder: Secondary | ICD-10-CM

## 2015-07-01 DIAGNOSIS — F3130 Bipolar disorder, current episode depressed, mild or moderate severity, unspecified: Secondary | ICD-10-CM

## 2015-07-01 HISTORY — PX: MINOR APPLICATION OF WOUND VAC: SHX6243

## 2015-07-01 HISTORY — PX: APPLICATION OF WOUND VAC: SHX5189

## 2015-07-01 LAB — CBC WITH DIFFERENTIAL/PLATELET
BASOS PCT: 1 %
Basophils Absolute: 0 10*3/uL (ref 0–0.1)
Basophils Absolute: 0.1 10*3/uL (ref 0–0.1)
Basophils Relative: 1 %
EOS ABS: 0.3 10*3/uL (ref 0–0.7)
Eosinophils Absolute: 0.3 10*3/uL (ref 0–0.7)
Eosinophils Relative: 4 %
Eosinophils Relative: 3 %
HEMATOCRIT: 33.2 % — AB (ref 40.0–52.0)
HEMATOCRIT: 35.3 % — AB (ref 40.0–52.0)
HEMOGLOBIN: 11.4 g/dL — AB (ref 13.0–18.0)
HEMOGLOBIN: 12.1 g/dL — AB (ref 13.0–18.0)
LYMPHS ABS: 1.4 10*3/uL (ref 1.0–3.6)
LYMPHS ABS: 1.4 10*3/uL (ref 1.0–3.6)
LYMPHS PCT: 15 %
Lymphocytes Relative: 17 %
MCH: 30.6 pg (ref 26.0–34.0)
MCH: 30.3 pg (ref 26.0–34.0)
MCHC: 34.4 g/dL (ref 32.0–36.0)
MCHC: 34.4 g/dL (ref 32.0–36.0)
MCV: 89 fL (ref 80.0–100.0)
MCV: 88.2 fL (ref 80.0–100.0)
MONO ABS: 0.6 10*3/uL (ref 0.2–1.0)
MONOS PCT: 8 %
MONOS PCT: 7 %
Monocytes Absolute: 0.7 10*3/uL (ref 0.2–1.0)
NEUTROS ABS: 5.8 10*3/uL (ref 1.4–6.5)
NEUTROS ABS: 7.1 10*3/uL — AB (ref 1.4–6.5)
NEUTROS PCT: 70 %
NEUTROS PCT: 74 %
Platelets: 348 10*3/uL (ref 150–440)
Platelets: 370 10*3/uL (ref 150–440)
RBC: 3.73 MIL/uL — AB (ref 4.40–5.90)
RBC: 4 MIL/uL — ABNORMAL LOW (ref 4.40–5.90)
RDW: 13.6 % (ref 11.5–14.5)
RDW: 13.4 % (ref 11.5–14.5)
WBC: 8.3 10*3/uL (ref 3.8–10.6)
WBC: 9.4 10*3/uL (ref 3.8–10.6)

## 2015-07-01 LAB — BASIC METABOLIC PANEL
Anion gap: 9 (ref 5–15)
BUN: 10 mg/dL (ref 6–20)
CALCIUM: 8.9 mg/dL (ref 8.9–10.3)
CHLORIDE: 103 mmol/L (ref 101–111)
CO2: 26 mmol/L (ref 22–32)
CREATININE: 1.07 mg/dL (ref 0.61–1.24)
GFR calc non Af Amer: >60 mL/min (ref >60)
GLUCOSE: 92 mg/dL (ref 65–99)
Potassium: 3.6 mmol/L (ref 3.5–5.1)
Sodium: 138 mmol/L (ref 135–145)

## 2015-07-01 LAB — WOUND CULTURE: Special Requests: NORMAL

## 2015-07-01 LAB — URIC ACID: URIC ACID, SERUM: 6.6 mg/dL (ref 4.4–7.6)

## 2015-07-01 SURGERY — MINOR APPLICATION OF WOUND VAC
Anesthesia: General | Site: Abdomen | Wound class: Contaminated

## 2015-07-01 MED ORDER — PROPOFOL 10 MG/ML IV BOLUS
INTRAVENOUS | Status: DC | PRN
  Administered 2015-07-01: 100 mg via INTRAVENOUS

## 2015-07-01 MED ORDER — ALPRAZOLAM 1 MG PO TABS
1.0000 mg | ORAL_TABLET | Freq: Four times a day (QID) | ORAL | Status: DC | PRN
  Administered 2015-07-01 – 2015-07-03 (×3): 1 mg via ORAL
  Filled 2015-07-01 (×3): qty 1

## 2015-07-01 MED ORDER — ALPRAZOLAM 1 MG PO TABS
1.0000 mg | ORAL_TABLET | ORAL | Status: AC
  Administered 2015-07-01: 1 mg via ORAL
  Filled 2015-07-01: qty 1

## 2015-07-01 MED ORDER — ACETAMINOPHEN 10 MG/ML IV SOLN
1000.0000 mg | Freq: Once | INTRAVENOUS | Status: DC
  Filled 2015-07-01: qty 100

## 2015-07-01 MED ORDER — KETOROLAC TROMETHAMINE 30 MG/ML IJ SOLN
30.0000 mg | Freq: Once | INTRAMUSCULAR | Status: AC
  Administered 2015-07-01: 30 mg via INTRAVENOUS
  Filled 2015-07-01: qty 1

## 2015-07-01 MED ORDER — PROPOFOL 500 MG/50ML IV EMUL
INTRAVENOUS | Status: DC | PRN
  Administered 2015-07-01: 150 ug/kg/min via INTRAVENOUS

## 2015-07-01 MED ORDER — FENTANYL CITRATE (PF) 100 MCG/2ML IJ SOLN
25.0000 ug | INTRAMUSCULAR | Status: AC | PRN
  Administered 2015-07-01 (×6): 25 ug via INTRAVENOUS

## 2015-07-01 MED ORDER — LIDOCAINE HCL (CARDIAC) 20 MG/ML IV SOLN
INTRAVENOUS | Status: DC | PRN
  Administered 2015-07-01: 50 mg via INTRAVENOUS

## 2015-07-01 MED ORDER — MIDAZOLAM HCL 2 MG/2ML IJ SOLN
INTRAMUSCULAR | Status: DC | PRN
  Administered 2015-07-01: 2 mg via INTRAVENOUS

## 2015-07-01 MED ORDER — DIPHENHYDRAMINE HCL 25 MG PO CAPS
25.0000 mg | ORAL_CAPSULE | ORAL | Status: DC | PRN
  Administered 2015-07-01 – 2015-07-03 (×6): 25 mg via ORAL
  Filled 2015-07-01 (×6): qty 1

## 2015-07-01 MED ORDER — SULFAMETHOXAZOLE-TRIMETHOPRIM 800-160 MG PO TABS
2.0000 | ORAL_TABLET | Freq: Two times a day (BID) | ORAL | Status: DC
  Administered 2015-07-01 – 2015-07-04 (×6): 2 via ORAL
  Filled 2015-07-01 (×6): qty 2

## 2015-07-01 MED ORDER — LACTATED RINGERS IV SOLN
INTRAVENOUS | Status: DC | PRN
  Administered 2015-07-01: 11:00:00 via INTRAVENOUS

## 2015-07-01 MED ORDER — ONDANSETRON HCL 4 MG/2ML IJ SOLN: 4.0000 mg | Freq: Once | INTRAMUSCULAR | Status: DC | PRN

## 2015-07-01 MED ORDER — FENTANYL CITRATE (PF) 100 MCG/2ML IJ SOLN
INTRAMUSCULAR | Status: DC | PRN
  Administered 2015-07-01 (×2): 50 ug via INTRAVENOUS

## 2015-07-01 SURGICAL SUPPLY — 18 items
CANISTER SUCT 1200ML W/VALVE (MISCELLANEOUS) ×4 IMPLANT
DRAPE INCISE IOBAN 66X45 STRL (DRAPES) IMPLANT
DRAPE LAPAROTOMY T 102X78X121 (DRAPES) ×4 IMPLANT
DRSG EMULSION OIL 3X8 NADH (GAUZE/BANDAGES/DRESSINGS) ×4 IMPLANT
DRSG VAC ATS MED SENSATRAC (GAUZE/BANDAGES/DRESSINGS) ×4 IMPLANT
GLOVE BIO SURGEON STRL SZ7 (GLOVE) ×4 IMPLANT
GOWN STRL REUS W/ TWL LRG LVL3 (GOWN DISPOSABLE) ×2 IMPLANT
GOWN STRL REUS W/ TWL XL LVL3 (GOWN DISPOSABLE) ×2 IMPLANT
GOWN STRL REUS W/TWL LRG LVL3 (GOWN DISPOSABLE) ×2
GOWN STRL REUS W/TWL XL LVL3 (GOWN DISPOSABLE) ×2
KIT RM TURNOVER STRD PROC AR (KITS) ×4 IMPLANT
NS IRRIG 1000ML POUR BTL (IV SOLUTION) ×4 IMPLANT
PACK BASIN MINOR ARMC (MISCELLANEOUS) ×4 IMPLANT
PAD GROUND ADULT SPLIT (MISCELLANEOUS) ×4 IMPLANT
SOL PREP PVP 2OZ (MISCELLANEOUS) ×4
SOLUTION PREP PVP 2OZ (MISCELLANEOUS) ×2 IMPLANT
SPONGE LAP 18X18 5 PK (GAUZE/BANDAGES/DRESSINGS) ×4 IMPLANT
WND VAC CANISTER 500ML (MISCELLANEOUS) ×4 IMPLANT

## 2015-07-01 NOTE — Progress Notes (Signed)
[place] INFECTIOUS DISEASE PROGRESS NOTE Date of Admission:  06/25/2015     ID: Joshua Mays is a 49 y.o. male with Post op hernia repair infection  Principal Problem:   Bipolar I disorder, most recent episode depressed ([place]) Active Problems:   Wound infection after surgery   Borderline personality disorder   Alcohol abuse   Subjective: No fevers, wound vac changed today, showed good granulation tissue around mesh per op note Complains of pain around abd wound site  ROS  Eleven systems are reviewed and negative except per hpi  Medications:  Antibiotics Given (last 72 hours)    Date/Time Action Medication Dose Rate   06/28/15 2310 Given   vancomycin (VANCOCIN) 1,500 mg in sodium chloride 0.9 % 500 mL IVPB 1,500 mg 250 mL/hr   06/29/15 1240 Given   vancomycin (VANCOCIN) 1,500 mg in sodium chloride 0.9 % 500 mL IVPB 1,500 mg 250 mL/hr   06/30/15 0033 Given   vancomycin (VANCOCIN) 1,500 mg in sodium chloride 0.9 % 500 mL IVPB 1,500 mg 250 mL/hr   06/30/15 1405 Given   rifampin (RIFADIN) capsule 600 mg 600 mg    06/30/15 2202 Given  [delayed from pharmacy]   vancomycin (VANCOCIN) 1,250 mg in sodium chloride 0.9 % 250 mL IVPB 1,250 mg 166.7 mL/hr   07/01/15 0649 Given   vancomycin (VANCOCIN) 1,250 mg in sodium chloride 0.9 % 250 mL IVPB 1,250 mg 166.7 mL/hr   07/01/15 1331 Given  [pt in surgery]   rifampin (RIFADIN) capsule 600 mg 600 mg      . ALPRAZolam  1 mg Oral STAT  . enoxaparin (LOVENOX) injection  40 mg Subcutaneous Q24H  . meloxicam  15 mg Oral Daily  . QUEtiapine  400 mg Oral QHS  . rifampin  600 mg Oral Daily  . vancomycin  1,250 mg Intravenous Q12H    Objective: Vital signs in last 24 hours: Temp:  [96.8 F (36 C)-97.9 F (36.6 C)] 97.7 F (36.5 C) (10/07 1411) Pulse Rate:  [59-91] 75 (10/07 1411) Resp:  [16-42] 18 (10/07 1411) BP: (110-142)/(75-96) 142/96 mmHg (10/07 1411) SpO2:  [98 %-100 %] 98 % (10/07 1411) Constitutional: He is oriented  to person, place, and time. He appears well-developed and well-nourished. No distress.  HENT:  Mouth/Throat: Oropharynx is clear and moist. No oropharyngeal exudate.  Cardiovascular: Normal rate, regular rhythm and normal heart sounds. Exam reveals no gallop and no friction rub.  No murmur heard.  Pulmonary/Chest: Effort normal and breath sounds normal. No respiratory distress. He has no wheezes.  Abdominal: wound vac in place Lymphadenopathy: He has no cervical adenopathy.  Neurological: He is alert and oriented to person, place, and time.  Skin: Skin is warm and dry. No rash noted. No erythema.  Psychiatric: He has a flat affect.   Lab Results  Recent Labs  06/30/15 1051 07/01/15 0518  WBC  --  8.3  HGB  --  11.4*  HCT  --  33.2*  CREATININE 1.16  --     Microbiology: Results for orders placed or performed during the hospital encounter of 06/25/15  Culture, blood (routine x 2)     Status: None   Collection Time: 06/25/15  4:47 PM  Result Value Ref Range Status   Specimen Description BLOOD RIGHT ARM  Final   Special Requests BOTTLES DRAWN AEROBIC AND ANAEROBIC 5CC  Final   Culture NO GROWTH 5 DAYS  Final   Report Status 06/30/2015 FINAL  Final  Culture, blood (  routine x 2)     Status: None   Collection Time: 06/25/15  4:47 PM  Result Value Ref Range Status   Specimen Description BLOOD LEFT ARM  Final   Special Requests BOTTLES DRAWN AEROBIC AND ANAEROBIC 5CC  Final   Culture NO GROWTH 5 DAYS  Final   Report Status 06/30/2015 FINAL  Final  Wound culture     Status: None   Collection Time: 06/25/15  6:58 PM  Result Value Ref Range Status   Specimen Description ABDOMEN  Final   Special Requests Normal  Final   Gram Stain MODERATE WBC SEEN NO ORGANISMS SEEN   Final   Culture NO GROWTH 4 DAYS  Final   Report Status 06/29/2015 FINAL  Final  Anaerobic culture     Status: None   Collection Time: 06/25/15  6:58 PM  Result Value Ref Range Status   Specimen  Description ABDOMEN  Final   Special Requests Normal  Final   Culture NO ANAEROBES ISOLATED  Final   Report Status 06/29/2015 FINAL  Final  Wound culture     Status: None   Collection Time: 06/28/15  2:33 PM  Result Value Ref Range Status   Specimen Description WOUND  Final   Special Requests Normal  Final   Gram Stain FEW WBC SEEN FEW GRAM POSITIVE COCCI   Final   Culture LIGHT GROWTH STAPHYLOCOCCUS AUREUS  Final   Report Status 07/01/2015 FINAL  Final   Organism ID, Bacteria STAPHYLOCOCCUS AUREUS  Final      Susceptibility   Staphylococcus aureus - MIC*    CIPROFLOXACIN <=0.5 SENSITIVE Sensitive     ERYTHROMYCIN >=8 RESISTANT Resistant     GENTAMICIN <=0.5 SENSITIVE Sensitive     OXACILLIN 0.5 SENSITIVE Sensitive     TRIMETH/SULFA <=10 SENSITIVE Sensitive     CLINDAMYCIN <=0.25 RESISTANT Resistant     CEFOXITIN SCREEN NEGATIVE Sensitive     Inducible Clindamycin Value in next row Resistant      POSITIVEINDUCIBLE CLINDAMYCIN RESISTANCE - A positive ICR test is indicative of inducible resistance to macrolides, lincosamides, and type B streptogramin.  This isolate is presumed to be resistant to Clindamycin, however, Clindamycin may still be effective in some patients.     * LIGHT GROWTH STAPHYLOCOCCUS AUREUS    Studies/Results: No results found.  Assessment/Plan: Joshua Mays is a 49 y.o. male with post hernia repair infection, mesh in place, cultures negative as otpt but MSSA here, with CT at admit showing persistent 2.6 cm fluid collection. Has been on vanco and has been showing some improvement. Cx pending for  atypical infection with mycobacteria or fungal.  Cx growing Staph aureus - MSSA.   Mesh was exposed per op note from 10/5. He has wound vac in place. Recommendations Continue vancomycin and rifampin for now When ready for DC change to bactrim high dose 2 DS BID and rifampin 600 mg qd - Would rec at least 14 day course - may need to extend based on wound  healing Will need to have weekly cbc cmet checked on these abx I can see in clinic in 1-2 weeks Thank you very much for the consult. Will follow with you.  [place], [place]   07/01/2015, 3:13 PM

## 2015-07-01 NOTE — Anesthesia Preprocedure Evaluation (Signed)
Anesthesia Evaluation  Patient identified by MRN, date of birth, ID band Patient awake    Reviewed: Allergy & Precautions, NPO status , Patient's Chart, lab work & pertinent test results  Airway Mallampati: II       Dental no notable dental hx.    Pulmonary COPD, Current Smoker,     + decreased breath sounds      Cardiovascular hypertension, Normal cardiovascular exam     Neuro/Psych Depression Bipolar Disorder negative neurological ROS     GI/Hepatic Neg liver ROS, PUD, GERD  ,  Endo/Other    Renal/GU negative Renal ROS     Musculoskeletal negative musculoskeletal ROS (+)   Abdominal (+) + obese,   Peds  Hematology negative hematology ROS (+)   Anesthesia Other Findings   Reproductive/Obstetrics negative OB ROS                             Anesthesia Physical Anesthesia Plan  ASA: III  Anesthesia Plan: General   Post-op Pain Management:    Induction: Intravenous  Airway Management Planned: LMA and Oral ETT  Additional Equipment:   Intra-op Plan:   Post-operative Plan: Extubation in OR  Informed Consent: I have reviewed the patients History and Physical, chart, labs and discussed the procedure including the risks, benefits and alternatives for the proposed anesthesia with the patient or authorized representative who has indicated his/her understanding and acceptance.     Plan Discussed with: CRNA  Anesthesia Plan Comments:         Anesthesia Quick Evaluation

## 2015-07-01 NOTE — Progress Notes (Signed)
Initial Nutrition Assessment     INTERVENTION:  Meals and snacks: cater to pt preferences once back on po diet    NUTRITION DIAGNOSIS:   Inadequate oral intake related to altered GI function as evidenced by NPO status.    GOAL:   Patient will meet greater than or equal to 90% of their needs    MONITOR:    (Energy intake, Digestive system)  REASON FOR ASSESSMENT:   LOS    ASSESSMENT:      Pt wound infection s/p wound debridement with wound vac placement in OR this am. Recent hernia repair with retrorectus mesh.  Past Medical History  Diagnosis Date  . Ulcerative colitis ([place]) 1990  . Bipolar 1 disorder ([place])   . Depression   . History of substance abuse   . Dysrhythmia     "FLUTTERING"  . GERD (gastroesophageal reflux disease)     NO MEDS-WELL-CONTROLLED  . 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 Hypertension 2011    OFF MEDS SINCE 2014-EATING BETTER AND LOST WEIGHT-BP BETTER PER PT  . Gout     Current Nutrition: NPO, previously on regular and tolerating per I and O sheet  Food/Nutrition-Related History: unable to determine, per Malnutrition Screening Tool normal appetite prior to admission   Medications: reviewed  Electrolyte/Renal Profile and Glucose Profile:   Recent Labs Lab 06/25/15 1331 06/27/15 1141 06/30/15 1051  NA 138  --   --   K 4.0  --   --   CL 106  --   --   CO2 24  --   --   BUN 13  --   --   CREATININE 1.03 0.89 1.16  CALCIUM 9.4  --   --   GLUCOSE 150*  --   --    Protein Profile:  Recent Labs Lab 06/25/15 1331  ALBUMIN 3.1*    Gastrointestinal Profile: ileostomy Last BM: 10/6   Nutrition-Focused Physical Exam Findings:  Unable to complete Nutrition-Focused physical exam at this time.     Weight Change: wt encounters reviewed, stable wt    Diet Order:  Diet NPO time specified Except for: Sips with Meds  Skin:   reviewed   Height:   Ht Readings from Last 1 Encounters:   06/25/15 5\' 11"  (1.803 m)    Weight:   Wt Readings from Last 1 Encounters:  06/25/15 207 lb 14.4 oz (94.303 kg)       BMI:  Body mass index is 29.01 kg/(m^2).   EDUCATION NEEDS:   No education needs identified at this time  LOW Care Level  Ovadia Lopp B. Zenia Resides, [place], [place] (pager)

## 2015-07-01 NOTE — Progress Notes (Signed)
Notified Dr Bary Castilla that pt IV infiltrated and that pt is refusing to allow any attempt to insert new IV by any staff member; Dr acknowledged, no new orders

## 2015-07-01 NOTE — Care Management (Signed)
Discussed case with Dr Bary Castilla this morning after he rounded with patient. Informed Dr Bary Castilla that I had been told by Deliah Goody RN this morning that patient had stated to Michiel Cowboy RN at end of shift last night that he "just wanted to kill himself." Dr Bary Castilla stated that he had discussed treatment plan with patient and that patient was distraught about not being able to return to his girlfriends home at discharge.

## 2015-07-01 NOTE — Progress Notes (Signed)
IV access loss. Patient refuses further attempts. Not a candidate for PICC line due to anticipated duration of therapy. We'll change to oral agents recommended by infectious disease and plan on discharge tomorrow.

## 2015-07-01 NOTE — Anesthesia Postprocedure Evaluation (Signed)
  Anesthesia Post-op Note  Patient: Joshua Mays  Procedure(s) Performed: Procedure(s): WOUND VAC CHANGE (N/A)  Anesthesia type:General  Patient location: PACU  Post pain: Pain level controlled  Post assessment: Post-op Vital signs reviewed, Patient's Cardiovascular Status Stable, Respiratory Function Stable, Patent Airway and No signs of Nausea or vomiting  Post vital signs: Reviewed and stable  Last Vitals:  Filed Vitals:   07/01/15 1411  BP: 142/96  Pulse: 75  Temp: 36.5 C  Resp: 18    Level of consciousness: awake, alert  and patient cooperative  Complications: No apparent anesthesia complications

## 2015-07-01 NOTE — Transfer of Care (Signed)
Immediate Anesthesia Transfer of Care Note  Patient: Joshua Mays  Procedure(s) Performed: Procedure(s): WOUND VAC CHANGE (N/A)  Patient Location: PACU  Anesthesia Type:MAC  Level of Consciousness: Alert, Awake, Oriented  Airway & Oxygen Therapy: Patient Spontanous Breathing  Post-op Assessment: Report given to RN  Post vital signs: Reviewed and stable  Last Vitals:  Filed Vitals:   07/01/15 1130  BP: 110/81  Pulse: 91  Temp: 36 C  Resp: 16    Complications: No apparent anesthesia complications

## 2015-07-01 NOTE — Care Management (Addendum)
Referral placed with Merri Ray at [facility] for wound care RN.   Wound Vac Delivered to room by [facility] courier.

## 2015-07-01 NOTE — Progress Notes (Signed)
Pt refuses IV.   

## 2015-07-01 NOTE — Op Note (Signed)
Preoperative diagnosis: Abdominal wound infection.  Postoperative diagnosis: Same. Operative procedure: Wound VAC dressing change.  Operative surgeon: Ollen Bowl, M.D.  Anesthesia: Monitored anesthesia care.  Estimated blood loss: None.  Little note: This 49 year old male returns to the operating room 48 hours after previous wound debridement and wound VAC placement.  Operative note: With the patient under adequate sedation the area was prepped with Betadine solution and draped. Excellent granulation tissue was noted throughout. The previously 1 x 3 cm area of exposed mesh at the inferior aspect has decreased by 50%. Clean granulation tissue is noted in this area. Some residual Vicryls sutures were removed from the sidewall. The area was irrigated with saline. Adaptic, a free applied.  Patient tolerated procedure well and was taken recovery room stable condition

## 2015-07-01 NOTE — Progress Notes (Signed)
Called Dr Bary Castilla re pt diet order following procedure; Dr ordered regular diet

## 2015-07-01 NOTE — Consult Note (Signed)
[place] [facility] Consult   Reason for Consult:  Consult for this 49 year old man with a history of mood instability currently in the hospital for repairs of a hernia now with postsurgical infection. Concern expressed by surgeon about suicidal ideation. Referring Physician:  Bary Castilla Patient Identification: Joshua Mays MRN:  [id number] Principal Diagnosis: Bipolar I disorder, most recent episode depressed [facility]) Diagnosis:   Patient Active Problem List   Diagnosis Date Noted  . Bipolar I disorder, most recent episode depressed ([place]) [F31.30] 07/01/2015  . Borderline personality disorder [F60.3] 07/01/2015  . Alcohol abuse [F10.10] 07/01/2015  . Wound infection after surgery [T81.4XXA] 06/25/2015  . Post-operative wound abscess [T81.4XXA] 06/23/2015  . Ventral hernia [K43.9] 06/13/2015  . Recurrent ventral hernia [K43.2] 06/09/2015  . Hernia, umbilical [M33.6] 09/16/4974  . Affective bipolar disorder ([place]) [F31.9] 05/18/2015  . Chronic pain [G89.29] 05/18/2015  . CC (Crohn's colitis) ([place]) [K50.10] 05/18/2015  . Clinical depression [F32.9] 05/18/2015  . Failure of erection [N52.9] 05/18/2015  . 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 Personal history of urinary disorder [Z87.448] 05/18/2015  . H/O malignant neoplasm [Z85.9] 05/18/2015  . H/O: substance abuse [Z87.898] 05/18/2015  . Personal history of arthritis [Z87.39] 05/18/2015  . BP (high blood pressure) [I10] 05/18/2015  . 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 Colitis gravis ([place]) [K51.90] 05/18/2015  . B12 deficiency [E53.8] 05/18/2015  . 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 male patient admitted with "I can't believe that I came to this place".  HPI:  Consult for this 49 year old man with a history of mood instability. Consult requested by surgery because of secondhand  reports that the patient had made suicidal statements. Patient tells me that his mood is currently feeling extremely bad. Combination of angry and depressed. He is not sleeping well at night. He feels somewhat hopeless about his medical situation. He blames his symptoms on his current medical situation here at the hospital. Patient goes on for quite a long time angrily talking about his treatment here. He has a list of complaints about that treatment being provided for him. Patient admitted that he has thoughts that come through his mind about wishing he were dead. He denies however that he has had any intention or plan of doing anything to try to kill himself. He denies that he is having any psychotic symptoms. Prior to coming into the hospital he said that his mood was bad but not nearly as bad as it is now. Evidently his mood is pretty chronically negative. Prior to the hospitalization however he said that he was at least feeling hopeful. Patient has been prescribed his medication for his bipolar disorder since being in the hospital. He denies any homicidal ideation. Current problems include history of bipolar disorder currently with multiple symptoms of depression, borderline personality disorder with history of mood lability and unstable relationships and a history of alcohol abuse apparently not well controlled.  Past psychiatric history: Patient has had 2 prior psychiatric hospitalizations as far as I can tell both them at our hospital in the year 2003. At that time he was having suicidal ideation. He has had one prior suicide attempt by wrist cutting. That was in 2003. No suicide attempts since then. He carries a long-standing diagnosis of bipolar disorder but does not describe any psychotic manic symptoms. Patient is currently being seen at [facility] and sees  a psychiatrist and a therapist.  Medical history: Ulcerative colitis. He came to the hospital for what he describes as a simple repair of a hernia.  Apparently he has had a wound infection and some complications since then. Patient has chronic pain related to his GI problems. So has a history of gout.  Family history: Patient states that there is some history of depression in his family but he doesn't know very much about the specific details.  Substance abuse history: There is a documented history of abuse of alcohol and prescription drugs. The patient denied to me believing that his alcohol had ever been an abuse problem. He says he continues to drink intermittently. He admits that he sometimes uses more of his clonazepam than is prescribed for him. Past history of abuse of prescription medicines.  Social history: Patient lives by himself. Not working. He is applying for disability. Has little income. He says he has a girlfriend but the relationship is getting worse. He feels he has little social support.  Past Psychiatric History: Long-standing problems with mood instability. One prior suicide attempt more than 10 years ago. 2 prior hospitalizations both more than 10 years ago. Currently compliant with outpatient treatment  Risk to Self: Is patient at risk for suicide?: No Risk to Others:   Prior Inpatient Therapy:   Prior Outpatient Therapy:    Past Medical History:  Past Medical History  Diagnosis Date  . Ulcerative colitis ([place]) 1990  . Bipolar 1 disorder ([place])   . Depression   . History of substance abuse   . Dysrhythmia     "FLUTTERING"  . GERD (gastroesophageal reflux disease)     NO MEDS-WELL-CONTROLLED  . 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 Hypertension 2011    OFF MEDS SINCE 2014-EATING BETTER AND LOST WEIGHT-BP BETTER PER PT  . Gout     Past Surgical History  Procedure Laterality Date  . Hand surgery  2011  . Ileostomy  1990  . Colectomy  1990  . Total colectomy  1990  . Umbilical hernia repair  2001  . Shoulder surgery  2001  . Other surgical history      MULTIPLE ABDOMINAL  SURGERIES  . Appendectomy  1990    TAKEN OUT WITH COLECTOMY  . Ventral hernia repair N/A 06/13/2015    Procedure: HERNIA REPAIR VENTRAL ADULT;  Surgeon: Robert Bellow, MD;  Location: [facility] ORS;  Service: General;  Laterality: N/A;  . Wound debridement N/A 06/29/2015    Procedure: DEBRIDEMENT ABDOMINAL WOUND;  Surgeon: Robert Bellow, MD;  Location: [facility] ORS;  Service: General;  Laterality: N/A;  . Minor application of wound vac N/A 06/29/2015    Procedure: MINOR APPLICATION OF WOUND VAC;  Surgeon: Robert Bellow, MD;  Location: [facility] ORS;  Service: General;  Laterality: N/A;   Family History:  Family History  Problem Relation Age of Onset  . Hyperlipidemia Mother   . Diabetes Father   . Vascular Disease Father   . Alcohol abuse Father   . Depression Father   . Hyperlipidemia Brother   . CVA Maternal Grandfather    Family Psychiatric  History: Family history positive for some history of mild depression no known suicide in family. Social History:  History  Alcohol Use  . 0.0 oz/week  . 0 Standard drinks or equivalent per week    Comment: OCC     History  Drug Use No    Social History  Social History  . Marital Status: Single    Spouse Name: N/A  . Number of Children: N/A  . Years of Education: N/A   Social History Main Topics  . Smoking status: Current Every Day Smoker -- 0.50 packs/day for 16 years    Types: Cigarettes  . Smokeless tobacco: Never Used  . Alcohol Use: 0.0 oz/week    0 Standard drinks or equivalent per week     Comment: OCC  . Drug Use: No  . Sexual Activity: Not Asked   Other Topics Concern  . None   Social History Narrative   Additional Social History:                          Allergies:   Allergies  Allergen Reactions  . Cefaclor Rash  . Ciprofloxacin Rash    Labs:  Results for orders placed or performed during the hospital encounter of 06/25/15 (from the past 48 hour(s))  Vancomycin, trough     Status: Abnormal    Collection Time: 06/30/15 10:51 AM  Result Value Ref Range   Vancomycin Tr 24 (HH) 10 - 20 ug/mL    Comment: CRITICAL RESULT CALLED TO, READ BACK BY AND VERIFIED WITH HAILY PARKER_0  ON 06/30/15 BY HP   Creatinine, serum     Status: None   Collection Time: 06/30/15 10:51 AM  Result Value Ref Range   Creatinine, Ser 1.16 0.61 - 1.24 mg/dL   GFR calc non Af Amer >60 >60 mL/min   GFR calc Af Amer >60 >60 mL/min    Comment: (NOTE) The eGFR has been calculated using the CKD EPI equation. This calculation has not been validated in all clinical situations. eGFR's persistently <60 mL/min signify possible Chronic Kidney Disease.   Vancomycin, trough     Status: None   Collection Time: 06/30/15  5:31 PM  Result Value Ref Range   Vancomycin Tr 13 10 - 20 ug/mL  CBC with Differential/Platelet     Status: Abnormal   Collection Time: 07/01/15  5:18 AM  Result Value Ref Range   WBC 8.3 3.8 - 10.6 K/uL   RBC 3.73 (L) 4.40 - 5.90 MIL/uL   Hemoglobin 11.4 (L) 13.0 - 18.0 g/dL   HCT 33.2 (L) 40.0 - 52.0 %   MCV 89.0 80.0 - 100.0 fL   MCH 30.6 26.0 - 34.0 pg   MCHC 34.4 32.0 - 36.0 g/dL   RDW 13.6 11.5 - 14.5 %   Platelets 348 150 - 440 K/uL   Neutrophils Relative % 70 %   Neutro Abs 5.8 1.4 - 6.5 K/uL   Lymphocytes Relative 17 %   Lymphs Abs 1.4 1.0 - 3.6 K/uL   Monocytes Relative 8 %   Monocytes Absolute 0.7 0.2 - 1.0 K/uL   Eosinophils Relative 4 %   Eosinophils Absolute 0.3 0 - 0.7 K/uL   Basophils Relative 1 %   Basophils Absolute 0.0 0 - 0.1 K/uL  Uric acid     Status: None   Collection Time: 07/01/15  5:18 AM  Result Value Ref Range   Uric Acid, Serum 6.6 4.4 - 7.6 mg/dL    Current Facility-Administered Medications  Medication Dose Route Frequency Provider Last Rate Last Dose  . acetaminophen (TYLENOL) tablet 650 mg  650 mg Oral Q6H PRN Leonie Green, MD   650 mg at 06/27/15 1706  . ALPRAZolam Duanne Moron) tablet 0.5 mg  0.5 mg Oral QID PRN Leonie Green, MD  0.5 mg  at 06/30/15 2202  . ALPRAZolam (XANAX) tablet 1 mg  1 mg Oral STAT Gonzella Lex, MD      . enoxaparin (LOVENOX) injection 40 mg  40 mg Subcutaneous Q24H Robert Bellow, MD   40 mg at 07/01/15 1335  . HYDROcodone-acetaminophen (NORCO/VICODIN) 5-325 MG per tablet 1-2 tablet  1-2 tablet Oral Q4H PRN Leonie Green, MD   2 tablet at 07/01/15 1332  . meloxicam (MOBIC) tablet 15 mg  15 mg Oral Daily Robert Bellow, MD   15 mg at 07/01/15 1332  . ondansetron (ZOFRAN-ODT) disintegrating tablet 4 mg  4 mg Oral Q6H PRN Leonie Green, MD       Or  . ondansetron [facility]) injection 4 mg  4 mg Intravenous Q6H PRN Leonie Green, MD      . ondansetron [facility]) injection 4 mg  4 mg Intravenous Once PRN Gijsbertus Lonia Mad, MD      . QUEtiapine (SEROQUEL XR) 24 hr tablet 400 mg  400 mg Oral QHS Leonie Green, MD   400 mg at 06/30/15 2201  . rifampin (RIFADIN) capsule 600 mg  600 mg Oral Daily Adrian Prows, MD   600 mg at 07/01/15 1331  . vancomycin (VANCOCIN) 1,250 mg in sodium chloride 0.9 % 250 mL IVPB  1,250 mg Intravenous Q12H Robert Bellow, MD   1,250 mg at 07/01/15 0649  . zolpidem (AMBIEN) tablet 5 mg  5 mg Oral QHS PRN Leonie Green, MD   5 mg at 07/01/15 0041    Musculoskeletal: Strength & Muscle Tone: within normal limits Gait & Station: unable to stand Patient leans: N/A  Psychiatric Specialty Exam: Review of Systems  Constitutional: Negative.   HENT: Negative.   Eyes: Negative.   Respiratory: Negative.   Cardiovascular: Negative.   Gastrointestinal: Positive for abdominal pain.  Musculoskeletal: Negative.   Skin: Negative.   Neurological: Negative.   Psychiatric/Behavioral: Positive for depression. Negative for suicidal ideas, hallucinations, memory loss and substance abuse. The patient is nervous/anxious and has insomnia.     Blood pressure 142/96, pulse 75, temperature 97.7 F (36.5 C), temperature source Oral, resp. rate 18, height 5'  11" (1.803 m), weight 94.303 kg (207 lb 14.4 oz), SpO2 98 %.Body mass index is 29.01 kg/(m^2).  General Appearance: Fairly Groomed  Engineer, water::  Fair  Speech:  Clear and Coherent  Volume:  Increased  Mood:  Irritable  Affect:  Labile  Thought Process:  Circumstantial  Orientation:  Full (Time, Place, and Person)  Thought Content:  Negative  Suicidal Thoughts:  Yes.  without intent/plan  Homicidal Thoughts:  No  Memory:  Immediate;   Fair Recent;   Fair Remote;   Fair  Judgement:  Other:  Patient has some indication of at least partially impaired judgment. He tells me that he had seriously considered smashing his wound VAC machine out the window but clearly had he has not actually acted on it. Apparently he was offered the opportunity to sign out AMA but seems to understand what a problem that would be.  Insight:  Fair  Psychomotor Activity:  Normal  Concentration:  Fair  Recall:  [employer]  Language: Fair  Akathisia:  No  Handed:  Right  AIMS (if indicated):     Assets:  Communication Skills Desire for Improvement Housing Resilience Social Support  ADL's:  Intact  Cognition: WNL  Sleep:      Treatment Plan Summary:  Daily contact with patient to assess and evaluate symptoms and progress in treatment, Medication management and Plan This is a patient who has an active problem with depression symptoms related to his bipolar disorder, and active problem of borderline personality disorder with low tolerance for stress and increased lability and difficulty in interpersonal relationships, a history of substance abuse which she has little insight into. Additionally he has his obvious medical problems including his acute pain. Concern was raised about suicidality. Suicide assessment completed. Patient has chronic suicidal thoughts and probably has some chronic suicidal risk that is above the baseline of the normal population but at this point does not appear to be an  acute risk for harming himself. I don't think he needs suicide precautions in the hospital. I think it's not likely that he is going to do anything to hurt himself in the hospital. I do not think he meets commitment criteria. I suspect that as his medical needs are met that his mood will improve. Coincident with his personality disorder I suspect that simply sympathetic listening and gentle attempts to meet his needs without doing anything inappropriate would be helpful. Which suggest not arguing with him which is likely to just escalate his mood and behavior. Cause of his substance abuse history I would not recommend giving him any controlled substances at discharge unless they are clearly indicated for his pain, but I do think that Xanax on a when necessary basis in the hospital will probably control some of his irritability and improve his ability to get along with staff.Reviewed old chart. Reviewed labs. case discussed with attending physician. Case to stick just with nursing. When necessary 1 mg Xanax for now and every 6 hours in the hospital. Not at discharge. I will continue to check up with him while he is in the hospital. I offered the option of adding Seroquel during the day but he did not want to do that. Continue the Seroquel at night.   Disposition: Patient does not meet criteria for psychiatric inpatient admission. Supportive therapy provided about ongoing stressors. Discussed crisis plan, support from social network, calling 911, coming to the Emergency Department, and calling Suicide Hotline.  John Clapacs 07/01/2015 3:07 PM

## 2015-07-01 NOTE — Progress Notes (Signed)
Social events of yesterday reviewed.  Patient reports he had been living with his girlfriend, and she is moving. The implication she is not having him come back to live with her. He does not want to live with his father. His mother has been here to visit. Does not want to go th SNF (if he qualified). Dr. Blane Ohara note of yesterday reviewed. We discussed why closing the wound is not an option in the face of infection and with exposed mesh. He gives the impression that he has nowhere to go, and in spite of the knowledge that the wound vac takes all wound care out of his hands, is very reluctant to go home with the same. Have placed a call to his PCP. Will proceed with dressing change and see where we stand with mesh coverage.

## 2015-07-01 NOTE — Progress Notes (Signed)
Pt states that pain medication is "not working," visibly upset and complaining to staff. Dr Terri Piedra notified, ordered 1 time dose of IV toradol and 1 time dose of IV tylenol.

## 2015-07-02 DIAGNOSIS — F3130 Bipolar disorder, current episode depressed, mild or moderate severity, unspecified: Secondary | ICD-10-CM

## 2015-07-02 MED ORDER — CALAMINE EX LOTN
TOPICAL_LOTION | Freq: Four times a day (QID) | CUTANEOUS | Status: DC
  Administered 2015-07-02 – 2015-07-03 (×4): via TOPICAL
  Administered 2015-07-04: 1 via TOPICAL
  Filled 2015-07-02: qty 118

## 2015-07-02 MED ORDER — DIPHENHYDRAMINE-ZINC ACETATE 2-0.1 % EX CREA: TOPICAL_CREAM | Freq: Three times a day (TID) | CUTANEOUS | Status: DC | PRN

## 2015-07-02 NOTE — Consult Note (Signed)
  Psychiatry: Follow-up for this 49 year old gentleman with chronic depression, bipolar disorder and currently in the hospital for surgery. Patient seen today. Chart reviewed including surgical notes. Vital signs and labs reviewed.  Patient states he is feeling much better today. He says that he feels like people are finally listening to him and taking his concerns seriously. He emphasizes that it is important to him to avoid the acute pain and discomfort that he associates with the wound VAC and to try and get into a comfortable situation and hopefully out of the hospital soon. Patient says his mood is feeling better. He completely denies any suicidal thoughts at all and says that he has apologized to the nursing staff but that he just felt overwhelmed and miserable yesterday which is why he made those comments.  On mental status this is a gentleman looks his stated age, alert and oriented and awake. Pleasant in conversation. Good eye contact. Affect euthymic and reactive. Psychomotor activity normal. No sign of psychosis.  Patient says that it is his understanding that he may be able to be discharged soon. His preference would be to be discharged tomorrow. I told him that I can completely understand that and that he should discuss it with Dr. Bary Castilla and that if it sounds like there is a safe plan for him to heal up as an outpatient with oral medication that that would make perfect sense to me. I think the patient would probably be a lot happier outside the hospital. At said I didn't make him any promises and don't pretend to impose my judgment about what is surgically safe.  Supportive counseling. No change to medicine. Continue to follow-up as needed in the hospital. No change to diagnosis.

## 2015-07-02 NOTE — Progress Notes (Signed)
AVSS.  Beh med assessment appreciated. Patient refused to have IV restarted, so antibiotic regimen was changed to 2-Bactrim DS tablets twice a day as recommended by infectious disease.  Abdomen: Some blistering under the wound VAC dressing, possibly related to the adhesive. Good vacuum seal. Changed from continuous suction at 125 mm to intermittent suction at 100 mm to see if this relieves local discomfort.  Lungs: Clear.  Psychiatric: Affect flat.  In light of psychiatry recommendations, we'll not push discharge so that he can be observed in the least stressful environment recognizing his high risk for failure both for wound and mental health issues.  Laboratory studies obtained yesterday showed normal white blood cell count and differential, renal function is normal.

## 2015-07-02 NOTE — Progress Notes (Signed)
Spoke with Dr. Bary Castilla regarding patient's complaints of uncontrolled pain, discomfort with wound vac.  Per Dr. Bary Castilla, nursing will remove wound vac and place a wet to dry dressing.  Will communicate this with patient's primary RN and nursing supervisor.  Hoyle Sauer, RN, [place] Charge Nurse

## 2015-07-03 MED ORDER — OXYCODONE HCL 5 MG PO TABS
5.0000 mg | ORAL_TABLET | ORAL | Status: DC | PRN
  Administered 2015-07-03: 5 mg via ORAL
  Filled 2015-07-03 (×3): qty 1

## 2015-07-03 MED ORDER — OXYCODONE HCL 5 MG PO TABS
10.0000 mg | ORAL_TABLET | ORAL | Status: DC | PRN
  Administered 2015-07-03 – 2015-07-04 (×5): 10 mg via ORAL
  Filled 2015-07-03 (×4): qty 2

## 2015-07-03 NOTE — Progress Notes (Signed)
Patient alert and oriented. Dressing to abdomen intact. Patient states that he is feeling better and has had family visiting him today. Patient taking po pain med every 4 hours. Patient also requesting anxiety medication and given as ordered.

## 2015-07-03 NOTE — Progress Notes (Signed)
AVSS. Reports rash of face yesterday, better today. Non-pruritic. Pain at wound site improved with discontinuation of the wound vac. Dressing changed: No necrotic tissue. No loculated fluid. No obvious exposed mesh. Blisters under wound vac adhesive have resolved.  Likely candidate for delayed primary closure within the next few days. Anxious to go home ( where he did not elaborate, as his girlfriend with whom he lives has not been up to see him since his admission), but no home health available today. Plan: Re-assess wound. Follow facial rash. ( ? Rxn to Bactrim or Rifampin or other cause).

## 2015-07-03 NOTE — Consult Note (Signed)
  Psychiatry: Follow-up for this 49 year old gentleman with some anxiety and dysphoria. Patient tells me that in general he is feeling much better. Pain is under good control. Although he had hoped for discharge he understands the reasoning of needing to find appropriate home health care. He is not behaving in an argumentative or agitated manner.  Patient has no new complaints. Continues to completely denies suicidal or homicidal ideation.  Calm and appropriate interaction. Good eye contact. Psychomotor activity normal. Speech normal. Affect euthymic. No suicidal or homicidal ideation. Lucid.  No change to diagnosis or treatment plan. Continue current medicine. Supportive counseling. Follow-up as needed.

## 2015-07-04 LAB — CREATININE, SERUM: Creatinine, Ser: 1.19 mg/dL (ref 0.61–1.24)

## 2015-07-04 MED ORDER — RIFAMPIN 300 MG PO CAPS: 600.0000 mg | ORAL_CAPSULE | Freq: Every day | ORAL | Status: DC

## 2015-07-04 MED ORDER — OXYCODONE HCL 5 MG PO TABS: 5.0000 mg | ORAL_TABLET | ORAL | Status: DC | PRN

## 2015-07-04 MED ORDER — DIPHENHYDRAMINE HCL 25 MG PO CAPS: 25.0000 mg | ORAL_CAPSULE | ORAL | Status: DC | PRN

## 2015-07-04 MED ORDER — SULFAMETHOXAZOLE-TRIMETHOPRIM 800-160 MG PO TABS: 2.0000 | ORAL_TABLET | Freq: Two times a day (BID) | ORAL | Status: DC

## 2015-07-04 NOTE — Discharge Planning (Signed)
Pt had no IV at DC.  Pt given DC papers, explained and educated.  VSS and RN assessment revealed stability for DC.  Wound dressing changed by Dr. Prior to DC.  Pt instructed to leave dressing in place till outpt appt (10/11) to close wound in OR with general surgery.  Pt told of needed FU with general OR 10/11 and also given scripts.  Pt will be wheeled to front when ready and family will be transporting home via car.

## 2015-07-04 NOTE — Progress Notes (Signed)
Afebrile.  Wound: Clean. New culture obtained.  New saline dressing applied.  It appears that the wound is ready for approximation. No OR time available on my schedule today.  The patient will return to all morning for wound dressing change. Present dressing will remain in place until that time.

## 2015-07-04 NOTE — Care Management (Signed)
Instructed patient nurse Juliane Lack to bring patient woundvac to nurses station. Contacted Neila Gear at [facility] who will send courier to pick up woundvac from nurses station.

## 2015-07-04 NOTE — Care Management (Signed)
Patient refused wound Vac and will be followed as outpatient. Will contact [facility] to pick up wound vac. Informed [facility] that services will not be needed. Patient is ambulating well around nursing station and conversing with nursing staff.  No CM needs at this time.

## 2015-07-04 NOTE — Discharge Instructions (Signed)
Please come to the Same Day Surgery area tomorrow, 07/05/15 at 8:30 a.m.  Nothing to eat or drink after midnight.  Afebrile.  Wound: Clean. New culture obtained.  New saline dressing applied.  It appears that the wound is ready for approximation. No OR time available on my schedule today.  The patient will return to all morning for wound dressing change. Present dressing will remain in place until that time.

## 2015-07-05 ENCOUNTER — Ambulatory Visit: Payer: Medicaid Other | Admitting: Anesthesiology

## 2015-07-05 ENCOUNTER — Ambulatory Visit
Admission: AD | Admit: 2015-07-05 | Discharge: 2015-07-05 | Disposition: A | Discharge status: Home / Self Care | Payer: Medicaid Other | Source: Ambulatory Visit | Attending: General Surgery | Admitting: General Surgery

## 2015-07-05 ENCOUNTER — Encounter
Admission: AD | Disposition: A | Discharge status: Home / Self Care | Payer: Self-pay | Source: Ambulatory Visit | Attending: General Surgery

## 2015-07-05 ENCOUNTER — Encounter: Payer: Self-pay | Admitting: Anesthesiology

## 2015-07-05 ENCOUNTER — Ambulatory Visit: Admit: 2015-07-05 | Payer: Self-pay | Admitting: General Surgery

## 2015-07-05 DIAGNOSIS — T814XXD Infection following a procedure, subsequent encounter: Secondary | ICD-10-CM | POA: Diagnosis not present

## 2015-07-05 DIAGNOSIS — Z8249 Family history of ischemic heart disease and other diseases of the circulatory system: Secondary | ICD-10-CM | POA: Insufficient documentation

## 2015-07-05 DIAGNOSIS — Z881 Allergy status to other antibiotic agents status: Secondary | ICD-10-CM | POA: Insufficient documentation

## 2015-07-05 DIAGNOSIS — Z811 Family history of alcohol abuse and dependence: Secondary | ICD-10-CM | POA: Insufficient documentation

## 2015-07-05 DIAGNOSIS — Z888 Allergy status to other drugs, medicaments and biological substances status: Secondary | ICD-10-CM | POA: Diagnosis not present

## 2015-07-05 DIAGNOSIS — F329 Major depressive disorder, single episode, unspecified: Secondary | ICD-10-CM | POA: Diagnosis not present

## 2015-07-05 DIAGNOSIS — Z833 Family history of diabetes mellitus: Secondary | ICD-10-CM | POA: Insufficient documentation

## 2015-07-05 DIAGNOSIS — I10 Essential (primary) hypertension: Secondary | ICD-10-CM | POA: Diagnosis not present

## 2015-07-05 DIAGNOSIS — Z818 Family history of other mental and behavioral disorders: Secondary | ICD-10-CM | POA: Insufficient documentation

## 2015-07-05 DIAGNOSIS — Z85828 Personal history of other malignant neoplasm of skin: Secondary | ICD-10-CM | POA: Insufficient documentation

## 2015-07-05 DIAGNOSIS — F319 Bipolar disorder, unspecified: Secondary | ICD-10-CM | POA: Diagnosis not present

## 2015-07-05 DIAGNOSIS — Z79899 Other long term (current) drug therapy: Secondary | ICD-10-CM | POA: Insufficient documentation

## 2015-07-05 DIAGNOSIS — Z823 Family history of stroke: Secondary | ICD-10-CM | POA: Diagnosis not present

## 2015-07-05 DIAGNOSIS — K432 Incisional hernia without obstruction or gangrene: Secondary | ICD-10-CM | POA: Insufficient documentation

## 2015-07-05 DIAGNOSIS — IMO0001 Reserved for inherently not codable concepts without codable children: Secondary | ICD-10-CM

## 2015-07-05 DIAGNOSIS — F17210 Nicotine dependence, cigarettes, uncomplicated: Secondary | ICD-10-CM | POA: Insufficient documentation

## 2015-07-05 DIAGNOSIS — K429 Umbilical hernia without obstruction or gangrene: Secondary | ICD-10-CM | POA: Diagnosis present

## 2015-07-05 HISTORY — PX: WOUND DEBRIDEMENT: SHX247

## 2015-07-05 SURGERY — IRRIGATION AND DEBRIDEMENT, WOUND, ABDOMEN, WITH CLOSURE
Anesthesia: Choice

## 2015-07-05 SURGERY — IRRIGATION AND DEBRIDEMENT, WOUND, ABDOMEN, WITH CLOSURE
Anesthesia: General | Wound class: Clean

## 2015-07-05 MED ORDER — LIDOCAINE HCL (CARDIAC) 20 MG/ML IV SOLN
INTRAVENOUS | Status: DC | PRN
  Administered 2015-07-05: 80 mg via INTRAVENOUS

## 2015-07-05 MED ORDER — OXYCODONE HCL 5 MG/5ML PO SOLN: 5.0000 mg | Freq: Once | ORAL | Status: AC | PRN

## 2015-07-05 MED ORDER — VANCOMYCIN HCL 10 G IV SOLR
1500.0000 mg | Freq: Once | INTRAVENOUS | Status: AC
  Administered 2015-07-05: 1500 mg via INTRAVENOUS
  Filled 2015-07-05: qty 1500

## 2015-07-05 MED ORDER — VANCOMYCIN HCL IN DEXTROSE 1-5 GM/200ML-% IV SOLN
INTRAVENOUS | Status: AC
  Filled 2015-07-05: qty 200

## 2015-07-05 MED ORDER — FENTANYL CITRATE (PF) 100 MCG/2ML IJ SOLN
INTRAMUSCULAR | Status: AC
  Administered 2015-07-05: 25 ug via INTRAVENOUS
  Filled 2015-07-05: qty 2

## 2015-07-05 MED ORDER — FENTANYL CITRATE (PF) 100 MCG/2ML IJ SOLN
25.0000 ug | INTRAMUSCULAR | Status: AC | PRN
  Administered 2015-07-05 (×6): 25 ug via INTRAVENOUS

## 2015-07-05 MED ORDER — PROPOFOL 10 MG/ML IV BOLUS
INTRAVENOUS | Status: DC | PRN
  Administered 2015-07-05: 200 mg via INTRAVENOUS

## 2015-07-05 MED ORDER — MIDAZOLAM HCL 2 MG/2ML IJ SOLN
INTRAMUSCULAR | Status: DC | PRN
  Administered 2015-07-05: 2 mg via INTRAVENOUS

## 2015-07-05 MED ORDER — OXYCODONE HCL 5 MG PO TABS
ORAL_TABLET | ORAL | Status: AC
  Administered 2015-07-05: 5 mg via ORAL
  Filled 2015-07-05: qty 1

## 2015-07-05 MED ORDER — ONDANSETRON HCL 4 MG/2ML IJ SOLN
INTRAMUSCULAR | Status: DC | PRN
  Administered 2015-07-05: 4 mg via INTRAVENOUS

## 2015-07-05 MED ORDER — OXYCODONE HCL 5 MG PO TABS
ORAL_TABLET | ORAL | Status: AC
  Filled 2015-07-05: qty 1

## 2015-07-05 MED ORDER — EPHEDRINE SULFATE 50 MG/ML IJ SOLN
INTRAMUSCULAR | Status: DC | PRN
  Administered 2015-07-05 (×2): 10 mg via INTRAVENOUS

## 2015-07-05 MED ORDER — OXYCODONE HCL 5 MG PO TABS
5.0000 mg | ORAL_TABLET | Freq: Once | ORAL | Status: AC | PRN
  Administered 2015-07-05: 5 mg via ORAL

## 2015-07-05 MED ORDER — FENTANYL CITRATE (PF) 100 MCG/2ML IJ SOLN
INTRAMUSCULAR | Status: DC | PRN
  Administered 2015-07-05 (×2): 50 ug via INTRAVENOUS

## 2015-07-05 MED ORDER — IPRATROPIUM-ALBUTEROL 0.5-2.5 (3) MG/3ML IN SOLN
3.0000 mL | Freq: Once | RESPIRATORY_TRACT | Status: AC
  Administered 2015-07-05: 3 mL via RESPIRATORY_TRACT

## 2015-07-05 MED ORDER — LACTATED RINGERS IV SOLN
INTRAVENOUS | Status: DC
Start: 2015-07-05 — End: 2015-07-05
  Administered 2015-07-05: 10:00:00 via INTRAVENOUS

## 2015-07-05 MED ORDER — IPRATROPIUM-ALBUTEROL 0.5-2.5 (3) MG/3ML IN SOLN
RESPIRATORY_TRACT | Status: AC
  Administered 2015-07-05: 3 mL via RESPIRATORY_TRACT
  Filled 2015-07-05: qty 3

## 2015-07-05 MED ORDER — DEXAMETHASONE SODIUM PHOSPHATE 4 MG/ML IJ SOLN
INTRAMUSCULAR | Status: DC | PRN
  Administered 2015-07-05: 10 mg via INTRAVENOUS

## 2015-07-05 MED ORDER — ACETAMINOPHEN 10 MG/ML IV SOLN
INTRAVENOUS | Status: AC
  Filled 2015-07-05: qty 100

## 2015-07-05 MED ORDER — ACETAMINOPHEN 10 MG/ML IV SOLN
INTRAVENOUS | Status: DC | PRN
  Administered 2015-07-05: 1000 mg via INTRAVENOUS

## 2015-07-05 SURGICAL SUPPLY — 34 items
BLADE SURG 15 STRL SS SAFETY (BLADE) ×3 IMPLANT
BULB RESERV EVAC DRAIN JP 100C (MISCELLANEOUS) ×3 IMPLANT
CANISTER SUCT 1200ML W/VALVE (MISCELLANEOUS) ×3 IMPLANT
CATH TRAY 16F METER LATEX (MISCELLANEOUS) IMPLANT
DRAIN CHANNEL JP 15F RND 16 (MISCELLANEOUS) ×3 IMPLANT
DRAPE LAPAROTOMY 100X77 ABD (DRAPES) ×3 IMPLANT
DRSG TEGADERM 4X4.75 (GAUZE/BANDAGES/DRESSINGS) ×3 IMPLANT
DRSG TELFA 3X8 NADH (GAUZE/BANDAGES/DRESSINGS) ×3 IMPLANT
ELECT CAUTERY NEEDLE TIP 1.0 (MISCELLANEOUS)
ELECTRODE CAUTERY NEDL TIP 1.0 (MISCELLANEOUS) IMPLANT
GAUZE SPONGE 4X4 12PLY STRL (GAUZE/BANDAGES/DRESSINGS) IMPLANT
GLOVE INDICATOR 7.5 STRL GRN (GLOVE) ×6 IMPLANT
GLOVE INDICATOR 8.0 STRL GRN (GLOVE) ×6 IMPLANT
GLOVE SURG LX 7.5 STRW (GLOVE)
GLOVE SURG LX STRL 7.5 STRW (GLOVE) IMPLANT
GOWN STRL REUS W/ TWL LRG LVL3 (GOWN DISPOSABLE) ×2 IMPLANT
GOWN STRL REUS W/TWL LRG LVL3 (GOWN DISPOSABLE) ×4
KIT RM TURNOVER STRD PROC AR (KITS) ×3 IMPLANT
LABEL OR SOLS (LABEL) ×3 IMPLANT
LIQUID BAND (GAUZE/BANDAGES/DRESSINGS) ×3 IMPLANT
NS IRRIG 1000ML POUR BTL (IV SOLUTION) ×3 IMPLANT
PACK BASIN MAJOR ARMC (MISCELLANEOUS) ×3 IMPLANT
PAD ABD DERMACEA PRESS 5X9 (GAUZE/BANDAGES/DRESSINGS) IMPLANT
PAD GROUND ADULT SPLIT (MISCELLANEOUS) ×3 IMPLANT
RELOAD STAPLE SKIN SM 35W (MISCELLANEOUS) IMPLANT
SUT CHROMIC 0 CT 1 (SUTURE) IMPLANT
SUT CHROMIC BR 1/2CLE 2-0 54IN (SUTURE) IMPLANT
SUT ETHILON 3-0 FS-10 30 BLK (SUTURE) ×3
SUT MAXON ABS #0 GS21 30IN (SUTURE) IMPLANT
SUT MNCRL 3-0 UNDYED SH (SUTURE) ×3 IMPLANT
SUT MON AB 2-0 CT1 36 (SUTURE) ×9 IMPLANT
SUT MONOCRYL 3-0 UNDYED (SUTURE) ×6
SUTURE EHLN 3-0 FS-10 30 BLK (SUTURE) ×1 IMPLANT
WND VAC CANISTER 500ML (MISCELLANEOUS) IMPLANT

## 2015-07-05 NOTE — Anesthesia Preprocedure Evaluation (Signed)
Anesthesia Evaluation  Patient identified by MRN, date of birth, ID band Patient awake    Reviewed: Allergy & Precautions, H&P , NPO status , Patient's Chart, lab work & pertinent test results  History of Anesthesia Complications Negative for: history of anesthetic complications  Airway Mallampati: II  TM Distance: >3 FB Neck ROM: full    Dental no notable dental hx. (+) Teeth Intact   Pulmonary neg shortness of breath, COPD, Current Smoker,    breath sounds clear to auscultation + decreased breath sounds      Cardiovascular Exercise Tolerance: Good hypertension, Normal cardiovascular exam+ dysrhythmias  Rhythm:regular Rate:Normal     Neuro/Psych PSYCHIATRIC DISORDERS Anxiety Depression Bipolar Disorder negative neurological ROS     GI/Hepatic Neg liver ROS, PUD, GERD  Controlled,  Endo/Other  negative endocrine ROS  Renal/GU negative Renal ROS  negative genitourinary   Musculoskeletal negative musculoskeletal ROS (+)   Abdominal (+) + obese,   Peds  Hematology negative hematology ROS (+)   Anesthesia Other Findings Past Medical History:   Ulcerative colitis ([place])                        1990         Bipolar 1 disorder ([place])                                     Depression                                                   History of substance abuse                                   Dysrhythmia                                                    Comment:"FLUTTERING"   GERD (gastroesophageal reflux disease)                         Comment:NO MEDS-WELL-CONTROLLED   Hypertension                                    2011           Comment:OFF MEDS SINCE 2014-EATING BETTER AND LOST               WEIGHT-BP BETTER PER PT   Gout                                                         Skin cancer                                     2013  Comment: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, mohs and RADIATION-     Reproductive/Obstetrics negative OB ROS                             Anesthesia Physical  Anesthesia Plan  ASA: III  Anesthesia Plan: General LMA   Post-op Pain Management:    Induction: Intravenous  Airway Management Planned: LMA and Oral ETT  Additional Equipment:   Intra-op Plan:   Post-operative Plan: Extubation in OR  Informed Consent: I have reviewed the patients History and Physical, chart, labs and discussed the procedure including the risks, benefits and alternatives for the proposed anesthesia with the patient or authorized representative who has indicated his/her understanding and acceptance.   Dental Advisory Given  Plan Discussed with: CRNA  Anesthesia Plan Comments:         Anesthesia Quick Evaluation

## 2015-07-05 NOTE — Anesthesia Postprocedure Evaluation (Signed)
  Anesthesia Post-op Note  Patient: Joshua Mays  Procedure(s) Performed: Procedure(s): Delayed primary closure of abdominal wound  (N/A)  Anesthesia type:General LMA  Patient location: PACU  Post pain: Pain level controlled  Post assessment: Post-op Vital signs reviewed, Patient's Cardiovascular Status Stable, Respiratory Function Stable, Patent Airway and No signs of Nausea or vomiting  Post vital signs: Reviewed and stable  Last Vitals:  Filed Vitals:   07/05/15 1227  BP: 125/83  Pulse: 82  Temp:   Resp: 16    Level of consciousness: awake, alert  and patient cooperative  Complications: No apparent anesthesia complications

## 2015-07-05 NOTE — Op Note (Signed)
Preoperative diagnosis: Wound infection post hernia repair status post debridement.  Postoperative diagnosis: Same.  Operative procedure: Delayed primary closure.  Operating surgeon: Ollen Bowl, M.D.  Anesthesia: Gen. by LMA.  Estimated blood loss: 10 mL.  Clinical note: This 49 year old male underwent repair of multiple ventral defects with a 6 x 6 cm prosthetic mesh in the retrorectus position. He presented to the hospital with a wound infection. He underwent operative debridement which showed good adherence of the mesh and the small 1 x 2 cm area were was exposed inferiorly. He had a wound VAC placed for several days and then he no longer tolerated this. Wet-to-dry dressings have been done in the interval. He is brought to the operative for delayed primary closure.  Wound culture obtained yesterday was no growth at 24 hours.  Operative note: The patient underwent general anesthesia. The abdomen was prepped with Betadine solution and draped. The adipose tissue which was very healthy with good granulation coverage was approximated with interrupted 2-0 Monocryl sutures. The area of exposed mesh measured approximately 5 x 15 mm at the inferior aspect of the wound. Good adherence to the posterior rectus sheath was evident. No loculated purulence. Prior to adipose tissue closure a Keenan Bachelor drain was brought out through a separate stab wound incision in the left lower quadrant to minimize the possibility of seroma/hematoma formation. This was anchored in place with 3-0 nylon suture. The skin was approximated with interrupted 3-0 Monocryl sutures. The hospital equivalent of Dermabond was used on the skin. Telfa and Tegaderm dressing applied both over the surgical incision and the drain site.  The patient tolerated the procedure well and was brought to recovery room stable condition.

## 2015-07-05 NOTE — Discharge Instructions (Signed)

## 2015-07-05 NOTE — OR Nursing (Signed)
Patient states he will take another pain pill when he gets home.  Requests to leave without seeing MD.

## 2015-07-05 NOTE — OR Nursing (Signed)
Dr. Bary Castilla paged to request additional Roxicodone 5 mg before dc home.

## 2015-07-05 NOTE — Transfer of Care (Signed)
Immediate Anesthesia Transfer of Care Note  Patient: Joshua Mays  Procedure(s) Performed: Procedure(s): Delayed primary closure of abdominal wound  (N/A)  Patient Location: PACU  Anesthesia Type:General  Level of Consciousness: awake, alert  and oriented  Airway & Oxygen Therapy: Patient Spontanous Breathing and Patient connected to face mask oxygen  Post-op Assessment: Report given to RN and Post -op Vital signs reviewed and stable  Post vital signs: Reviewed and stable  Last Vitals:  Filed Vitals:   07/05/15 1051  BP: 114/74  Pulse: 95  Temp: 37.6 C  Resp: 19    Complications: No apparent anesthesia complications

## 2015-07-05 NOTE — H&P (Signed)
Patient with MSSA wound infection treated with IV Vancomycin, then oral Bactrim and rifampin. HEENT: Neg. Lungs: Clear. Cardio: RR. ABD: Clean, open wound.  Plan: Delayed primary closure. Patient aware of plans for drain that he will have on discharge.

## 2015-07-05 NOTE — Anesthesia Procedure Notes (Signed)
Procedure Name: LMA Insertion Date/Time: 07/05/2015 10:03 AM Performed by: Delaney Meigs Pre-anesthesia Checklist: Patient identified, Emergency Drugs available, Suction available, Patient being monitored and Timeout performed Patient Re-evaluated:Patient Re-evaluated prior to inductionOxygen Delivery Method: Circle system utilized and Simple face mask Preoxygenation: Pre-oxygenation with 100% oxygen Intubation Type: IV induction Ventilation: Mask ventilation without difficulty LMA Size: 4.5 Number of attempts: 1 Placement Confirmation: breath sounds checked- equal and bilateral and positive ETCO2 Tube secured with: Tape

## 2015-07-07 LAB — WOUND CULTURE: CULTURE: NO GROWTH

## 2015-07-08 ENCOUNTER — Ambulatory Visit (INDEPENDENT_AMBULATORY_CARE_PROVIDER_SITE_OTHER): Payer: Medicaid Other | Admitting: *Deleted

## 2015-07-08 DIAGNOSIS — K432 Incisional hernia without obstruction or gangrene: Secondary | ICD-10-CM

## 2015-07-08 NOTE — Progress Notes (Signed)
Patient came in today for a wound check.  The wound is clean, with no signs of infection noted. Follow up on Tuesday with Dr. Jamal Collin for wound check and drain removal. Patient was told to bring drain sheet with him at the time of visit.  He states his drainage on  Tuesday was 40   Wednesday 30 Thursday  25 Friday morning 25 No drain sheet today.Marland KitchenMarland KitchenMarland Kitchen

## 2015-07-12 ENCOUNTER — Ambulatory Visit (INDEPENDENT_AMBULATORY_CARE_PROVIDER_SITE_OTHER): Payer: Medicaid Other | Admitting: General Surgery

## 2015-07-12 ENCOUNTER — Encounter: Payer: Self-pay | Admitting: General Surgery

## 2015-07-12 VITALS — BP 150/78 | HR 80 | Resp 14 | Ht 72.0 in | Wt 202.0 lb

## 2015-07-12 DIAGNOSIS — IMO0001 Reserved for inherently not codable concepts without codable children: Secondary | ICD-10-CM

## 2015-07-12 DIAGNOSIS — T814XXA Infection following a procedure, initial encounter: Principal | ICD-10-CM

## 2015-07-12 DIAGNOSIS — K432 Incisional hernia without obstruction or gangrene: Secondary | ICD-10-CM

## 2015-07-12 MED ORDER — TRAMADOL HCL 50 MG PO TABS: 50.0000 mg | ORAL_TABLET | Freq: Two times a day (BID) | ORAL | Status: DC | PRN

## 2015-07-12 MED ORDER — OXYCODONE HCL 5 MG PO TABS: 5.0000 mg | ORAL_TABLET | ORAL | Status: DC | PRN

## 2015-07-12 NOTE — Progress Notes (Signed)
This is a 49 year old male here today for his post op ventral hernia repair. Patient states his drain sheet has blood all over it so he throw it away. He states the drainage has been about 92ml daily for the last two days. Drainage is minimal and serosanguinous. No redness or induration of incision.  Drain removed. F/U as scheduled      Patient to follow up with Dr. Bary Castilla on 07/15/15 at 12 o'clock.

## 2015-07-12 NOTE — Patient Instructions (Signed)
atient to follow up with Dr. Bary Castilla on 07/15/15 at 12 o'clock.

## 2015-07-13 ENCOUNTER — Encounter: Payer: Self-pay | Admitting: General Surgery

## 2015-07-15 ENCOUNTER — Encounter: Payer: Self-pay | Admitting: General Surgery

## 2015-07-15 ENCOUNTER — Ambulatory Visit (INDEPENDENT_AMBULATORY_CARE_PROVIDER_SITE_OTHER): Payer: Medicaid Other | Admitting: General Surgery

## 2015-07-15 VITALS — BP 130/74 | HR 78 | Resp 12 | Ht 72.0 in | Wt 197.0 lb

## 2015-07-15 DIAGNOSIS — T814XXD Infection following a procedure, subsequent encounter: Secondary | ICD-10-CM

## 2015-07-15 DIAGNOSIS — K432 Incisional hernia without obstruction or gangrene: Secondary | ICD-10-CM

## 2015-07-15 DIAGNOSIS — IMO0001 Reserved for inherently not codable concepts without codable children: Secondary | ICD-10-CM

## 2015-07-15 NOTE — Progress Notes (Signed)
Patient ID: Joshua Mays, male   DOB: 05/14/1966, 49 y.o.   MRN: [id number]  Chief Complaint  Patient presents with  . Routine Post Op    hernia repair    HPI Joshua Mays is a 49 y.o. male here today for his post op ventral hernia repair. Patient states he is doing well at this time. Drain was removed on 07/12/15.   The patient reports he is no longer making use of narcotics for pain. He feels his energy is getting stronger each day and his appetite is excellent. HPI  Past Medical History  Diagnosis Date  . Ulcerative colitis ([place]) 1990  . Bipolar 1 disorder ([place])   . Depression   . History of substance abuse   . Dysrhythmia     "FLUTTERING"  . GERD (gastroesophageal reflux disease)     NO MEDS-WELL-CONTROLLED  . Hypertension 2011    OFF MEDS SINCE 2014-EATING BETTER AND LOST WEIGHT-BP BETTER PER PT  . Gout   . 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 Hand surgery  2011  . Ileostomy  1990  . Colectomy  1990  . Total colectomy  1990  . Umbilical hernia repair  2001  . Shoulder surgery  2001  . Other surgical history      MULTIPLE ABDOMINAL SURGERIES  . Appendectomy  1990    TAKEN OUT WITH COLECTOMY  . Ventral hernia repair N/A 06/13/2015    Procedure: HERNIA REPAIR VENTRAL ADULT;  Surgeon: Robert Bellow, MD;  Location: [facility] ORS;  Service: General;  Laterality: N/A;  . Wound debridement N/A 06/29/2015    Procedure: DEBRIDEMENT ABDOMINAL WOUND;  Surgeon: Robert Bellow, MD;  Location: [facility] ORS;  Service: General;  Laterality: N/A;  . Minor application of wound vac N/A 06/29/2015    Procedure: MINOR APPLICATION OF WOUND VAC;  Surgeon: Robert Bellow, MD;  Location: [facility] ORS;  Service: General;  Laterality: N/A;  . Minor application of wound vac N/A 07/01/2015    Procedure: WOUND VAC CHANGE;  Surgeon: Robert Bellow, MD;  Location: [facility] ORS;  Service: General;  Laterality: N/A;   . Application of wound vac N/A 07/01/2015    Procedure: APPLICATION OF WOUND VAC;  Surgeon: Robert Bellow, MD;  Location: [facility] ORS;  Service: General;  Laterality: N/A;  . Wound debridement N/A 07/05/2015    Procedure: Delayed primary closure of abdominal wound ;  Surgeon: Robert Bellow, MD;  Location: [facility] ORS;  Service: General;  Laterality: N/A;    Family History  Problem Relation Age of Onset  . Hyperlipidemia Mother   . Diabetes Father   . Vascular Disease Father   . Alcohol abuse Father   . Depression Father   . Hyperlipidemia Brother   . CVA Maternal Grandfather     Social History Social History  Substance Use Topics  . Smoking status: Current Every Day Smoker -- 0.50 packs/day for 16 years    Types: Cigarettes  . Smokeless tobacco: Never Used  . Alcohol Use: 0.0 oz/week    0 Standard drinks or equivalent per week     Comment: OCC    Allergies  Allergen Reactions  . Cefaclor Rash  . Ciprofloxacin Rash    Current Outpatient Prescriptions  Medication Sig Dispense Refill  . clonazePAM (KLONOPIN) 0.5 MG tablet Take 0.5 mg by mouth daily as needed for anxiety.     Marland Kitchen  diphenhydrAMINE (BENADRYL) 25 mg capsule Take 1 capsule (25 mg total) by mouth every 4 (four) hours as needed for itching. 30 capsule 0  . ibuprofen (ADVIL,MOTRIN) 200 MG tablet Take 200 mg by mouth every 4 (four) hours as needed for mild pain or moderate pain.    . potassium chloride 20 MEQ TBCR Take 10 mEq by mouth 2 (two) times daily. 20 tablet 0  . QUEtiapine (SEROQUEL) 400 MG tablet Take 400 mg by mouth at bedtime.     . rifampin (RIFADIN) 300 MG capsule Take 2 capsules (600 mg total) by mouth daily. 60 capsule 0  . sulfamethoxazole-trimethoprim (BACTRIM DS,SEPTRA DS) 800-160 MG tablet Take 2 tablets by mouth every 12 (twelve) hours. 120 tablet 0  . traMADol (ULTRAM) 50 MG tablet Take 1 tablet (50 mg total) by mouth 2 (two) times daily as needed. 20 tablet 0   No current facility-administered  medications for this visit.    Review of Systems Review of Systems  Constitutional: Negative.   Respiratory: Negative.   Cardiovascular: Negative.     Blood pressure 130/74, pulse 78, resp. rate 12, height 6' (1.829 m), weight 197 lb (89.359 kg).  Physical Exam Physical Exam  Constitutional: He is oriented to person, place, and time. He appears well-developed and well-nourished.  Eyes: Conjunctivae are normal. No scleral icterus.  Neck: Neck supple.  Cardiovascular: Normal rate, regular rhythm and normal heart sounds.   Pulmonary/Chest: Effort normal and breath sounds normal.  Abdominal: Soft. Normal appearance and bowel sounds are normal. There is no hepatomegaly. There is no tenderness. No hernia (no weakness with legs elevated off the examining table.).    Lymphadenopathy:    He has no cervical adenopathy.  Neurological: He is alert and oriented to person, place, and time.  Skin: Skin is warm and dry.    Data Reviewed Recent presentation at the 2016 ACS meeting by B. Yates Decamp, M.D. of [facility] recommends following sedimentation rate and C-reactive protein to determine when antibiotic should be discontinued. These labs will be obtained today  Assessment    Doing well status post drainage and delayed primary repair of ventral hernia incision.    Plan    We'll plan for a follow-up examination in 10 days.     PCP:  Miguel Aschoff   Robert Bellow 07/16/2015, 7:37 AM

## 2015-07-16 DIAGNOSIS — T8149XA Infection following a procedure, other surgical site, initial encounter: Secondary | ICD-10-CM | POA: Insufficient documentation

## 2015-07-16 LAB — CBC WITH DIFFERENTIAL/PLATELET
BASOS: 1 %
Basophils Absolute: 0.1 10*3/uL (ref 0.0–0.2)
EOS (ABSOLUTE): 0.2 10*3/uL (ref 0.0–0.4)
EOS: 3 %
HEMATOCRIT: 41.2 % (ref 37.5–51.0)
Hemoglobin: 13.8 g/dL (ref 12.6–17.7)
IMMATURE GRANULOCYTES: 0 %
Immature Grans (Abs): 0 10*3/uL (ref 0.0–0.1)
LYMPHS ABS: 1.4 10*3/uL (ref 0.7–3.1)
Lymphs: 16 %
MCH: 30.3 pg (ref 26.6–33.0)
MCHC: 33.5 g/dL (ref 31.5–35.7)
MCV: 90 fL (ref 79–97)
MONOS ABS: 0.8 10*3/uL (ref 0.1–0.9)
Monocytes: 8 %
NEUTROS ABS: 6.6 10*3/uL (ref 1.4–7.0)
NEUTROS PCT: 72 %
PLATELETS: 426 10*3/uL — AB (ref 150–379)
RBC: 4.56 x10E6/uL (ref 4.14–5.80)
RDW: 13.8 % (ref 12.3–15.4)
WBC: 9.1 10*3/uL (ref 3.4–10.8)

## 2015-07-16 LAB — SEDIMENTATION RATE: Sed Rate: 24 mm/hr — ABNORMAL HIGH (ref 0–15)

## 2015-07-16 LAB — C-REACTIVE PROTEIN: CRP: 12.6 mg/L — ABNORMAL HIGH (ref 0.0–4.9)

## 2015-07-18 ENCOUNTER — Telehealth: Payer: Self-pay

## 2015-07-18 NOTE — Telephone Encounter (Signed)
-----   Message from Robert Bellow, MD sent at 07/16/2015  7:49 AM EDT ----- Please notify the patient that his blood test are looking good, but we need to continue his antibiotic at the present time. We'll recheck his lab values next visit. ----- Message -----    From: [facility] Lab Results In Interface    Sent: 07/16/2015   5:43 AM      To: Robert Bellow, MD

## 2015-07-18 NOTE — Telephone Encounter (Signed)
Notified patient as instructed, patient pleased. Patient aware to continue his antibiotics. Discussed follow-up appointment, patient agrees.

## 2015-07-25 ENCOUNTER — Ambulatory Visit: Payer: Medicaid Other | Admitting: General Surgery

## 2015-07-25 NOTE — Discharge Summary (Signed)
Physician Discharge Summary  Patient ID: Joshua Mays MRN: [id number] DOB/AGE: April 12, 1966 49 y.o.  Admit date: 06/25/2015 Discharge date: 07/25/2015  Admission Diagnoses: Wound infection status post hernia repair  Discharge Diagnoses:  Principal Problem:   Bipolar I disorder, most recent episode depressed ([place]) Active Problems:   Wound infection after surgery   Borderline personality disorder   Alcohol abuse   Discharged Condition: good  Hospital Course: The patient's wound responded well to the initiation of IV antibiotics and drainage. Culture showed a light growth of MSSA.  Consults: Infectious disease  Significant Diagnostic Studies: labs: Leukocytosis on admission.  Treatments: surgery: Operative debridement, wound VAC application, delayed primary closure.  Discharge Exam: Blood pressure 125/85, pulse 86, temperature 98.2 F (36.8 C), temperature source Oral, resp. rate 20, height 5\' 11"  (1.803 m), weight 207 lb 14.4 oz (94.303 kg), SpO2 96 %. General appearance: alert and cooperative Normal cardiopulmonary exam. Abdomen: Abdominal wound well approximated with serosanguineous drainage from the [facility] drain.  Disposition: 01-Home or Self Care  Discharge Instructions    Diet - low sodium heart healthy    Complete by:  As directed      Diet - low sodium heart healthy    Complete by:  As directed      Discharge instructions    Complete by:  As directed   Leave dressing in place until follow up tomorrow in the OR for wound closure. Take Bactrim (sulfa) and Rifampin as prescribed. Resume all of your usual medications. Tylenol: If needed for soreness. Advil/ Aleve/ Ibuprofen: If needed for soreness.  Oxycodone: If needed for pain. Nothing to eat or drink after Midnight tonight for surgery tomorrow. Present to [facility] desk at 8:30 AM for surgery at 10 AM to allow time for antibiotics to be given.  Take your regular morning medications with a sip of water at 6  AM. No narcotic meds after 6 AM.     Discharge instructions    Complete by:  As directed   Resume all of your regular medications.  Bactrim DS: 2 tablets twice a day.  Resume all of your regular medications.  Bactrim DS: 2 tablets twice a day.  Rifampin: 2 300 mg tablets once per day.  Leave present dressing on until the morning. This will be changed at the time of surgery.  Nothing to eat or drink after midnight except her regular medications with a sip of water at 6 AM. No narcotic pain medicines after 6 AM.  Present to medical Mall registration at 8:30 AM tomorrow for surgery.     Increase activity slowly    Complete by:  As directed      Increase activity slowly    Complete by:  As directed             Medication List    STOP taking these medications        HYDROcodone-acetaminophen 7.5-325 MG tablet  Commonly known as:  NORCO      TAKE these medications        diphenhydrAMINE 25 mg capsule  Commonly known as:  BENADRYL  Take 1 capsule (25 mg total) by mouth every 4 (four) hours as needed for itching.     ibuprofen 200 MG tablet  Commonly known as:  ADVIL,MOTRIN  Take 200 mg by mouth every 4 (four) hours as needed for mild pain or moderate pain.     KLONOPIN 0.5 MG tablet  Generic drug:  clonazePAM  Take 0.5 mg by  mouth daily as needed for anxiety.     Potassium Chloride ER 20 MEQ Tbcr  Take 10 mEq by mouth 2 (two) times daily.     rifampin 300 MG capsule  Commonly known as:  RIFADIN  Take 2 capsules (600 mg total) by mouth daily.     SEROQUEL 400 MG tablet  Generic drug:  QUEtiapine  Take 400 mg by mouth at bedtime.     sulfamethoxazole-trimethoprim 800-160 MG tablet  Commonly known as:  BACTRIM DS,SEPTRA DS  Take 2 tablets by mouth every 12 (twelve) hours.        Arrangements were made for follow-up in my office 4 days after discharge.  Signed: Robert Bellow 07/25/2015, 7:45 AM

## 2015-07-27 ENCOUNTER — Ambulatory Visit: Payer: Medicaid Other | Admitting: General Surgery

## 2015-08-04 ENCOUNTER — Ambulatory Visit (INDEPENDENT_AMBULATORY_CARE_PROVIDER_SITE_OTHER): Payer: Medicaid Other | Admitting: General Surgery

## 2015-08-04 ENCOUNTER — Encounter: Payer: Self-pay | Admitting: General Surgery

## 2015-08-04 VITALS — BP 130/80 | HR 88 | Resp 16 | Ht 72.0 in | Wt 207.0 lb

## 2015-08-04 DIAGNOSIS — T814XXD Infection following a procedure, subsequent encounter: Principal | ICD-10-CM

## 2015-08-04 DIAGNOSIS — K429 Umbilical hernia without obstruction or gangrene: Secondary | ICD-10-CM

## 2015-08-04 DIAGNOSIS — IMO0001 Reserved for inherently not codable concepts without codable children: Secondary | ICD-10-CM

## 2015-08-04 NOTE — Progress Notes (Signed)
Patient ID: Joshua Mays, male   DOB: May 25, 1966, 49 y.o.   MRN: [id number]  Chief Complaint  Patient presents with  . Routine Post Op    HPI Joshua Mays is a 49 y.o. male.  Here today for his postoperative visit. He states he is doing well. No new complaints. The patient has been painting his home. He reports no discomfort from the hernia site. No drainage. No fever or chills. No dietary intolerance. HPI  Past Medical History  Diagnosis Date  . Ulcerative colitis ([place]) 1990  . Bipolar 1 disorder ([place])   . Depression   . History of substance abuse   . Dysrhythmia     "FLUTTERING"  . GERD (gastroesophageal reflux disease)     NO MEDS-WELL-CONTROLLED  . Hypertension 2011    OFF MEDS SINCE 2014-EATING BETTER AND LOST WEIGHT-BP BETTER PER PT  . Gout   . 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 Hand surgery  2011  . Ileostomy  1990  . Colectomy  1990  . Total colectomy  1990  . Umbilical hernia repair  2001  . Shoulder surgery  2001  . Other surgical history      MULTIPLE ABDOMINAL SURGERIES  . Appendectomy  1990    TAKEN OUT WITH COLECTOMY  . Ventral hernia repair N/A 06/13/2015    Procedure: HERNIA REPAIR VENTRAL ADULT;  Surgeon: Robert Bellow, MD;  Location: [facility] ORS;  Service: General;  Laterality: N/A;  . Wound debridement N/A 06/29/2015    Procedure: DEBRIDEMENT ABDOMINAL WOUND;  Surgeon: Robert Bellow, MD;  Location: [facility] OR[facility];  Service: General;  Laterality: N/A;  . Minor application of wound vac N/A 06/29/2015    Procedure: MINOR APPLICATION OF WOUND VAC;  Surgeon: Robert Bellow, MD;  Location: [facility];  Service: General;  Laterality: N/A;  . Minor application of wound vac N/A 07/01/2015    Procedure: WOUND VAC CHANGE;  Surgeon: Robert Bellow, MD;  Location: [facility] OR[facility];  Service: General;  Laterality: N/A;  . Application of wound vac N/A 07/01/2015    Procedure:  APPLICATION OF WOUND VAC;  Surgeon: Robert Bellow, MD;  Location: [facility] OR[facility];  Service: General;  Laterality: N/A;  . Wound debridement N/A 07/05/2015    Procedure: Delayed primary closure of abdominal wound ;  Surgeon: Robert Bellow, MD;  Location: [facility] OR[facility];  Service: General;  Laterality: N/A;    Family History  Problem Relation Age of Onset  . Hyperlipidemia Mother   . Diabetes Father   . Vascular Disease Father   . Alcohol abuse Father   . Depression Father   . Hyperlipidemia Brother   . CVA Maternal Grandfather     Social History Social History  Substance Use Topics  . Smoking status: Current Every Day Smoker -- 0.50 packs/day for 16 years    Types: Cigarettes  . Smokeless tobacco: Never Used  . Alcohol Use: 0.0 oz/week    0 Standard drinks or equivalent per week     Comment: OCC    Allergies  Allergen Reactions  . Cefaclor Rash  . Ciprofloxacin Rash    Current Outpatient Prescriptions  Medication Sig Dispense Refill  . clonazePAM (KLONOPIN) 0.5 MG tablet Take 0.5 mg by mouth daily as needed for anxiety.     . diphenhydrAMINE (BENADRYL) 25 mg capsule Take 1 capsule (25 mg total) by mouth every  4 (four) hours as needed for itching. 30 capsule 0  . ibuprofen (ADVIL,MOTRIN) 200 MG tablet Take 200 mg by mouth every 4 (four) hours as needed for mild pain or moderate pain.    . potassium chloride 20 MEQ TBCR Take 10 mEq by mouth 2 (two) times daily. 20 tablet 0  . QUEtiapine (SEROQUEL) 400 MG tablet Take 400 mg by mouth at bedtime.      No current facility-administered medications for this visit.    Review of Systems Review of Systems  Constitutional: Negative.   Respiratory: Negative.   Cardiovascular: Negative.     Blood pressure 130/80, pulse 88, resp. rate 16, height 6' (1.829 m), weight 207 lb (93.895 kg).  Physical Exam Physical Exam  Abdominal:      Data Reviewed C-reactive protein and sedimentation rate were mildly elevated last  visit  Assessment    Steady  progress status post management of MSSA wound infection.    Plan    The importance of continued follow-up was emphasized (today's visit is over a week later than originally planned). The possibility of late mesh infections was discussed.  The patient is agreeable to a follow-up exam in 1 month. He is completed his antibiotics 4 days ago, and will not reinitiate unless there is a marked rise in his sedimentation rate and C-reactive protein values.      PCP:  Philemon Kingdom 08/05/2015, 5:24 AM

## 2015-08-04 NOTE — Patient Instructions (Signed)
The patient is aware to call back for any questions or concerns.  

## 2015-08-05 ENCOUNTER — Telehealth: Payer: Self-pay

## 2015-08-05 LAB — C-REACTIVE PROTEIN: CRP: 22.8 mg/L — AB (ref 0.0–4.9)

## 2015-08-05 LAB — SEDIMENTATION RATE: SED RATE: 11 mm/h (ref 0–15)

## 2015-08-05 NOTE — Telephone Encounter (Signed)
-----   Message from Robert Bellow, MD sent at 08/05/2015  7:19 AM EST ----- Please notify the patient that one of the blood test had returned to normal, the other remains elevated. It will be important to recheck in 1 month and he should promptly report any changes in the abdominal wound if they occur prior to that visit. ----- Message -----    From: [facility] Lab Results In Interface    Sent: 08/05/2015   5:38 AM      To: Robert Bellow, MD

## 2015-08-05 NOTE — Telephone Encounter (Signed)
Message left for patient to call for results. 

## 2015-08-09 NOTE — Telephone Encounter (Signed)
Notified patient as instructed, patient pleased. Discussed follow-up appointments, patient agrees  

## 2015-08-09 NOTE — Telephone Encounter (Signed)
-----   Message from Robert Bellow, MD sent at 08/05/2015  7:19 AM EST ----- Please notify the patient that one of the blood test had returned to normal, the other remains elevated. It will be important to recheck in 1 month and he should promptly report any changes in the abdominal wound if they occur prior to that visit. ----- Message -----    From: [facility] Lab Results In Interface    Sent: 08/05/2015   5:38 AM      To: Robert Bellow, MD

## 2015-08-11 LAB — ACID FAST CULTURE WITH REFLEXED SENSITIVITIES

## 2015-08-11 LAB — ACID FAST CULTURE WITH REFLEXED SENSITIVITIES (MYCOBACTERIA)
Acid Fast Culture: NEGATIVE
Acid Fast Smear: NEGATIVE

## 2015-08-14 ENCOUNTER — Encounter: Payer: Self-pay | Admitting: Infectious Diseases

## 2015-09-05 ENCOUNTER — Ambulatory Visit: Payer: Medicaid Other | Admitting: General Surgery

## 2015-09-16 ENCOUNTER — Ambulatory Visit (INDEPENDENT_AMBULATORY_CARE_PROVIDER_SITE_OTHER): Payer: Medicaid Other | Admitting: Family Medicine

## 2015-09-16 ENCOUNTER — Ambulatory Visit: Payer: Self-pay | Admitting: Family Medicine

## 2015-09-16 ENCOUNTER — Encounter: Payer: Self-pay | Admitting: Family Medicine

## 2015-09-16 VITALS — BP 112/78 | HR 103 | Temp 97.9°F | Resp 16 | Wt 209.0 lb

## 2015-09-16 DIAGNOSIS — F319 Bipolar disorder, unspecified: Secondary | ICD-10-CM | POA: Diagnosis not present

## 2015-09-16 MED ORDER — QUETIAPINE FUMARATE ER 400 MG PO TB24
400.0000 mg | ORAL_TABLET | Freq: Every day | ORAL | Status: DC
Start: 2015-09-16 — End: 2015-10-12

## 2015-09-16 NOTE — Patient Instructions (Signed)
We will call you with the referral.

## 2015-09-16 NOTE — Progress Notes (Signed)
Subjective:     Patient ID: Joshua Mays, male   DOB: 03/10/1966, 49 y.o.   MRN: [id number]  HPI  Chief Complaint  Patient presents with  . Depression    Patient is present in office to discuss getting refill on his prescription for Seroquel 400mg . Patient states that he has been going to [facility] to see psychiatrist and counselor and recently they have left the practice, patient states that he is looking currently for a different psychiatrist that he can stay consistent with. Patient reports good compliance, tolerance and symptom control.   States he is not able to get a consistent psychiatrist or therapist and wishes to change to a different psychiatric clinic.   Review of Systems     Objective:   Physical Exam  Constitutional: He appears well-developed and well-nourished. No distress.  Psychiatric: He has a normal mood and affect. His behavior is normal.       Assessment:    1. Bipolar 1 disorder (HCC) - QUEtiapine (SEROQUEL XR) 400 MG 24 hr tablet; Take 1 tablet (400 mg total) by mouth at bedtime.  Dispense: 30 tablet; Refill: 0 - Ambulatory referral to Psychiatry     Plan:    He will call back next week regarding referral if not completed.

## 2015-09-20 ENCOUNTER — Telehealth: Payer: Self-pay | Admitting: Family Medicine

## 2015-09-20 NOTE — Telephone Encounter (Signed)
Joshua Mays from [facility] states that she has attempted to contact pt and phone # has been disconnected

## 2015-10-06 ENCOUNTER — Telehealth: Payer: Self-pay

## 2015-10-06 NOTE — Telephone Encounter (Signed)
-----   Message from Robert Bellow, MD sent at 10/04/2015  1:41 PM EST ----- The patient missed his December postop visit. Please see if he is willing to reschedule.

## 2015-10-06 NOTE — Telephone Encounter (Signed)
Patient's phone not working. Message left with patient's mother to have him call to reschedule his post surgical follow up appointment.

## 2015-10-12 ENCOUNTER — Other Ambulatory Visit: Payer: Self-pay | Admitting: Family Medicine

## 2015-10-12 ENCOUNTER — Telehealth: Payer: Self-pay | Admitting: Family Medicine

## 2015-10-12 DIAGNOSIS — F319 Bipolar disorder, unspecified: Secondary | ICD-10-CM

## 2015-10-12 MED ORDER — QUETIAPINE FUMARATE ER 400 MG PO TB24: 400.0000 mg | ORAL_TABLET | Freq: Every day | ORAL | Status: DC

## 2015-10-12 NOTE — Telephone Encounter (Signed)
Refill request. KW 

## 2015-10-12 NOTE — Telephone Encounter (Signed)
I have refilled for one month. I expect him to find a psychiatrist to follow his bipolar disorder.

## 2015-10-12 NOTE — Telephone Encounter (Signed)
Pt needs refill QUEtiapine (SEROQUEL XR) 400 MG 24 hr tablet 09/16/15 -- Carmon Ginsberg, PA Take 1 tablet (400 mg total) by mouth at bedtime. He will run out before he sees another psychiatrist  Call back [facility].  Thanks Con Memos

## 2015-10-12 NOTE — Telephone Encounter (Signed)
Patient has been advised and he states that he is still looking into psych

## 2015-10-20 ENCOUNTER — Encounter: Payer: Self-pay | Admitting: *Deleted

## 2015-11-19 ENCOUNTER — Emergency Department: Payer: Medicaid Other

## 2015-11-19 ENCOUNTER — Emergency Department
Admission: EM | Admit: 2015-11-19 | Discharge: 2015-11-19 | Disposition: A | Discharge status: Home / Self Care | Payer: Medicaid Other | Attending: Emergency Medicine | Admitting: Emergency Medicine

## 2015-11-19 ENCOUNTER — Encounter: Payer: Self-pay | Admitting: *Deleted

## 2015-11-19 DIAGNOSIS — R0981 Nasal congestion: Secondary | ICD-10-CM | POA: Insufficient documentation

## 2015-11-19 DIAGNOSIS — Z79899 Other long term (current) drug therapy: Secondary | ICD-10-CM | POA: Diagnosis not present

## 2015-11-19 DIAGNOSIS — R05 Cough: Secondary | ICD-10-CM | POA: Diagnosis not present

## 2015-11-19 DIAGNOSIS — F17210 Nicotine dependence, cigarettes, uncomplicated: Secondary | ICD-10-CM | POA: Insufficient documentation

## 2015-11-19 DIAGNOSIS — R109 Unspecified abdominal pain: Secondary | ICD-10-CM | POA: Diagnosis present

## 2015-11-19 DIAGNOSIS — I10 Essential (primary) hypertension: Secondary | ICD-10-CM | POA: Insufficient documentation

## 2015-11-19 DIAGNOSIS — R0602 Shortness of breath: Secondary | ICD-10-CM | POA: Diagnosis not present

## 2015-11-19 DIAGNOSIS — R059 Cough, unspecified: Secondary | ICD-10-CM

## 2015-11-19 DIAGNOSIS — R1012 Left upper quadrant pain: Secondary | ICD-10-CM | POA: Insufficient documentation

## 2015-11-19 LAB — URINALYSIS COMPLETE WITH MICROSCOPIC (ARMC ONLY)
Bilirubin Urine: NEGATIVE
Glucose, UA: NEGATIVE mg/dL
Hgb urine dipstick: NEGATIVE
KETONES UR: NEGATIVE mg/dL
LEUKOCYTES UA: NEGATIVE
NITRITE: NEGATIVE
PROTEIN: 30 mg/dL — AB
Specific Gravity, Urine: 1.029 (ref 1.005–1.030)
pH: 6 (ref 5.0–8.0)

## 2015-11-19 LAB — CBC
HCT: 47.1 % (ref 40.0–52.0)
Hemoglobin: 16.1 g/dL (ref 13.0–18.0)
MCH: 28.8 pg (ref 26.0–34.0)
MCHC: 34.1 g/dL (ref 32.0–36.0)
MCV: 84.4 fL (ref 80.0–100.0)
PLATELETS: 270 10*3/uL (ref 150–440)
RBC: 5.58 MIL/uL (ref 4.40–5.90)
RDW: 15 % — AB (ref 11.5–14.5)
WBC: 8.7 10*3/uL (ref 3.8–10.6)

## 2015-11-19 LAB — COMPREHENSIVE METABOLIC PANEL
ALBUMIN: 3.8 g/dL (ref 3.5–5.0)
ALK PHOS: 121 U/L (ref 38–126)
ALT: 38 U/L (ref 17–63)
AST: 43 U/L — AB (ref 15–41)
Anion gap: 10 (ref 5–15)
BILIRUBIN TOTAL: 0.8 mg/dL (ref 0.3–1.2)
BUN: 9 mg/dL (ref 6–20)
CALCIUM: 9.1 mg/dL (ref 8.9–10.3)
CO2: 24 mmol/L (ref 22–32)
CREATININE: 0.98 mg/dL (ref 0.61–1.24)
Chloride: 100 mmol/L — ABNORMAL LOW (ref 101–111)
GFR calc Af Amer: >60 mL/min (ref >60)
GLUCOSE: 106 mg/dL — AB (ref 65–99)
POTASSIUM: 3.9 mmol/L (ref 3.5–5.1)
Sodium: 134 mmol/L — ABNORMAL LOW (ref 135–145)
TOTAL PROTEIN: 8.4 g/dL — AB (ref 6.5–8.1)

## 2015-11-19 LAB — RAPID INFLUENZA A&B ANTIGENS (ARMC ONLY)
INFLUENZA A (ARMC): NOT DETECTED
INFLUENZA B (ARMC): NOT DETECTED

## 2015-11-19 LAB — LIPASE, BLOOD: Lipase: 20 U/L (ref 11–51)

## 2015-11-19 MED ORDER — HYDROCOD POLST-CPM POLST ER 10-8 MG/5ML PO SUER: 5.0000 mL | Freq: Two times a day (BID) | ORAL | Status: DC | PRN

## 2015-11-19 MED ORDER — IOHEXOL 240 MG/ML SOLN
25.0000 mL | INTRAMUSCULAR | Status: AC
  Administered 2015-11-19: 25 mL via ORAL

## 2015-11-19 MED ORDER — IOHEXOL 240 MG/ML SOLN: 25.0000 mL | Freq: Once | INTRAMUSCULAR | Status: DC | PRN

## 2015-11-19 MED ORDER — SODIUM CHLORIDE 0.9 % IV BOLUS (SEPSIS)
1000.0000 mL | Freq: Once | INTRAVENOUS | Status: AC
  Administered 2015-11-19: 1000 mL via INTRAVENOUS

## 2015-11-19 MED ORDER — HYDROMORPHONE HCL 1 MG/ML IJ SOLN
1.0000 mg | Freq: Once | INTRAMUSCULAR | Status: AC
Start: 2015-11-19 — End: 2015-11-19
  Administered 2015-11-19: 1 mg via INTRAVENOUS
  Filled 2015-11-19: qty 1

## 2015-11-19 MED ORDER — HYDROCOD POLST-CPM POLST ER 10-8 MG/5ML PO SUER
5.0000 mL | Freq: Once | ORAL | Status: AC
  Administered 2015-11-19: 5 mL via ORAL
  Filled 2015-11-19: qty 5

## 2015-11-19 MED ORDER — IOHEXOL 300 MG/ML  SOLN
100.0000 mL | Freq: Once | INTRAMUSCULAR | Status: AC | PRN
  Administered 2015-11-19: 100 mL via INTRAVENOUS

## 2015-11-19 NOTE — ED Provider Notes (Signed)
[facility] Emergency Department Provider Note  Time seen: 5:28 PM  I have reviewed the triage vital signs and the nursing notes.   HISTORY  Chief Complaint Abdominal Pain    HPI Joshua Mays is a 50 y.o. male with a past medical history of ulcerative colitis, status post ileostomy 25 years ago, recent hernia operation last year who presents the emergency department left-sided abdominal pain. According to the patient for the past 4-5 days he has been coughing, initially had a headache with vomiting and fever states those symptoms have resolved and now he is only coughing. Today he coughed very hard and felt sudden severe sharp stabbing left-sided abdominal pain. States 10/10 pain to the left abdomen, it is mildly improved but remains severe per patient. Patient feels like his abdomen is swollen and distended, states he has had good ostomy output today. Denies any current nausea, vomiting. Denies fever for the past 3 days.     Past Medical History  Diagnosis Date  . Ulcerative colitis ([place]) 1990  . Bipolar 1 disorder ([place])   . Depression   . History of substance abuse   . Dysrhythmia     "FLUTTERING"  . GERD (gastroesophageal reflux disease)     NO MEDS-WELL-CONTROLLED  . Hypertension 2011    OFF MEDS SINCE 2014-EATING BETTER AND LOST WEIGHT-BP BETTER PER PT  . Gout   . 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 Postoperative wound infection 07/16/2015  . Bipolar I disorder, most recent episode depressed ([place]) 07/01/2015  . Borderline personality disorder 07/01/2015  . Alcohol abuse 07/01/2015  . Wound infection after surgery 06/25/2015  . Post-operative wound abscess 06/23/2015  . Ventral hernia 06/13/2015  . 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 Hernia, umbilical [id number]  . Affective bipolar disorder ([place]) 05/18/2015  . Chronic pain 05/18/2015  . CC (Crohn's  colitis) ([place]) 05/18/2015  . Clinical depression 05/18/2015  . Failure of erection 05/18/2015  . 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 Personal history of urinary disorder 05/18/2015  . H/O malignant neoplasm 05/18/2015  . H/O: substance abuse 05/18/2015  . Personal history of arthritis 05/18/2015  . BP (high blood pressure) 05/18/2015  . 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 Colitis gravis ([place]) 05/18/2015  . B12 deficiency 05/18/2015  . 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 Hand surgery  2011  . Ileostomy  1990  . Colectomy  1990  . Total colectomy  1990  . Umbilical hernia repair  2001  . Shoulder surgery  2001  . Other surgical history      MULTIPLE ABDOMINAL SURGERIES  . Appendectomy  1990    TAKEN OUT WITH COLECTOMY  . Ventral hernia repair N/A 06/13/2015    Procedure: HERNIA REPAIR VENTRAL ADULT;  Surgeon: Robert Bellow, MD;  Location: [facility] ORS;  Service: General;  Laterality: N/A;  . Wound debridement N/A 06/29/2015    Procedure: DEBRIDEMENT ABDOMINAL WOUND;  Surgeon: Robert Bellow, MD;  Location: [facility] ORS;  Service: General;  Laterality: N/A;  . Minor application of wound vac N/A 06/29/2015    Procedure: MINOR APPLICATION OF WOUND VAC;  Surgeon: Robert Bellow, MD;  Location: [facility] ORS;  Service: General;  Laterality: N/A;  . Minor application of wound vac N/A 07/01/2015    Procedure: WOUND VAC CHANGE;  Surgeon: Robert Bellow, MD;  Location: [facility] ORS;  Service: General;  Laterality: N/A;  . Application of wound vac N/A 07/01/2015    Procedure: APPLICATION OF WOUND VAC;  Surgeon: Robert Bellow, MD;  Location: [facility] ORS;  Service: General;  Laterality: N/A;  . Wound debridement N/A 07/05/2015    Procedure: Delayed primary closure of abdominal wound ;  Surgeon:  Robert Bellow, MD;  Location: [facility] ORS;  Service: General;  Laterality: N/A;    Current Outpatient Rx  Name  Route  Sig  Dispense  Refill  . ibuprofen (ADVIL,MOTRIN) 200 MG tablet   Oral   Take 200 mg by mouth every 4 (four) hours as needed for mild pain or moderate pain.         Marland Kitchen oxyCODONE (OXY IR/ROXICODONE) 5 MG immediate release tablet      1-2 pills po q 4-6H prn         . potassium chloride 20 MEQ TBCR   Oral   Take 10 mEq by mouth 2 (two) times daily. Patient not taking: Reported on 09/16/2015   20 tablet   0   . QUEtiapine (SEROQUEL XR) 400 MG 24 hr tablet   Oral   Take 1 tablet (400 mg total) by mouth at bedtime.   30 tablet   0     Allergies Cefaclor and Ciprofloxacin  Family History  Problem Relation Age of Onset  . Hyperlipidemia Mother   . Diabetes Father   . Vascular Disease Father   . Alcohol abuse Father   . Depression Father   . Hyperlipidemia Brother   . CVA Maternal Grandfather     Social History Social History  Substance Use Topics  . Smoking status: Current Every Day Smoker -- 0.50 packs/day for 16 years    Types: Cigarettes  . Smokeless tobacco: Never Used  . Alcohol Use: 0.0 oz/week    0 Standard drinks or equivalent per week     Comment: OCC    Review of Systems Constitutional: Fever 4-5 days ago. ENT: Positive nasal congestion Cardiovascular: Negative for chest pain. Respiratory: Positive for cough with occasional shortness of breath during coughing episodes. Gastrointestinal: Significant left-sided abdominal pain. Genitourinary: Negative for dysuria. Neurological: Negative for headache 10-point ROS otherwise negative.  ____________________________________________   PHYSICAL EXAM:  VITAL SIGNS: ED Triage Vitals  Enc Vitals Group     BP 11/19/15 1649 135/110 mmHg     Pulse Rate 11/19/15 1649 93     Resp 11/19/15 1649 16     Temp 11/19/15 1649 98.2 F (36.8 C)     Temp Source 11/19/15 1649 Oral     SpO2  11/19/15 1649 95 %     Weight 11/19/15 1649 210 lb (95.255 kg)     Height 11/19/15 1649 5\' 11"  (1.803 m)     Head Cir --      Peak Flow --      Pain Score 11/19/15 1650 10     Pain Loc --      Pain Edu? --      Excl. in [place]? --     Constitutional: Alert and oriented. Well appearing and in no distress. Eyes: Normal exam ENT   Head: Normocephalic and atraumatic.   Mouth/Throat: Mucous membranes are moist. Cardiovascular: Normal rate, regular rhythm. No murmur Respiratory: Normal respiratory effort without tachypnea nor retractions. Moderate expiratory  wheeze bilaterally. No rales or rhonchi. Gastrointestinal: Soft, moderate left-sided abdominal tenderness to palpation. No rebound or guarding. No distention. Normal-appearing ostomy with good output. Musculoskeletal: Nontender with normal range of motion in all extremities.  Neurologic:  Normal speech and language. No gross focal neurologic deficits Skin:  Skin is warm, dry and intact.  Psychiatric: Mood and affect are normal. Speech and behavior are normal. ____________________________________________    EKG  EKG reviewed and interpreted by myself shows normal sinus rhythm at 89 bpm, narrow QRS, normal axis, normal intervals, no ST changes. Normal EKG.  ____________________________________________    RADIOLOGY  CT shows no acute abnormality  ____________________________________________   INITIAL IMPRESSION / ASSESSMENT AND PLAN / ED COURSE  Pertinent labs & imaging results that were available during my care of the patient were reviewed by me and considered in my medical decision making (see chart for details).  Patient presents to the emergency department with left-sided abdominal pain after coughing. Patient continues to have significant tenderness to palpation over the left side of his abdomen. Given his multiple abdominal surgeries with recent hernia repair we'll proceed with CT abdomen/pelvis to rule out  intra-abdominal pathology. We will check labs including chest x-ray and a flu swab.  Labs are largely within normal limits. CT abdomen/pelvis shows no acute abnormality. Influenza test negative. We will treat the patient with Tussionex, have him follow-up with his primary care physician for recheck in 2-3 days.  ____________________________________________   FINAL CLINICAL IMPRESSION(S) / ED DIAGNOSES  Cough Left-sided abdominal pain   Harvest Dark, MD 11/19/15 2046

## 2015-11-19 NOTE — ED Notes (Signed)
Pt reports having flu like symptoms beginning Tuesday. Pt had a fever, headache, vomiting and cough. Pt reports taking over the counter medication and no longer has a fever but reports today while coughing he felt a sudden onset of left lower abd pain. Pt has significant abd hx from 2016 and a hernia repair in the area the pain is reported to be. Abd is distended at this time and painful to the touch.

## 2015-11-19 NOTE — Discharge Instructions (Signed)
Cough, Adult Coughing is a reflex that clears your throat and your airways. Coughing helps to heal and protect your lungs. It is normal to cough occasionally, but a cough that happens with other symptoms or lasts a long time may be a sign of a condition that needs treatment. A cough may last only 2-3 weeks (acute), or it may last longer than 8 weeks (chronic). CAUSES Coughing is commonly caused by:  Breathing in substances that irritate your lungs.  A viral or bacterial respiratory infection.  Allergies.  Asthma.  Postnasal drip.  Smoking.  Acid backing up from the stomach into the esophagus (gastroesophageal reflux).  Certain medicines.  Chronic lung problems, including COPD (or rarely, lung cancer).  Other medical conditions such as heart failure. HOME CARE INSTRUCTIONS  Pay attention to any changes in your symptoms. Take these actions to help with your discomfort:  Take medicines only as told by your health care provider.  If you were prescribed an antibiotic medicine, take it as told by your health care provider. Do not stop taking the antibiotic even if you start to feel better.  Talk with your health care provider before you take a cough suppressant medicine.  Drink enough fluid to keep your urine clear or pale yellow.  If the air is dry, use a cold steam vaporizer or humidifier in your bedroom or your home to help loosen secretions.  Avoid anything that causes you to cough at work or at home.  If your cough is worse at night, try sleeping in a semi-upright position.  Avoid cigarette smoke. If you smoke, quit smoking. If you need help quitting, ask your health care provider.  Avoid caffeine.  Avoid alcohol.  Rest as needed. SEEK MEDICAL CARE IF:   You have new symptoms.  You cough up pus.  Your cough does not get better after 2-3 weeks, or your cough gets worse.  You cannot control your cough with suppressant medicines and you are losing sleep.  You  develop pain that is getting worse or pain that is not controlled with pain medicines.  You have a fever.  You have unexplained weight loss.  You have night sweats. SEEK IMMEDIATE MEDICAL CARE IF:  You cough up blood.  You have difficulty breathing.  Your heartbeat is very fast.   This information is not intended to replace advice given to you by your health care provider. Make sure you discuss any questions you have with your health care provider.   Document Released: 03/09/2011 Document Revised: 06/01/2015 Document Reviewed: 11/17/2014 Elsevier Interactive Patient Education 2016 Elsevier Inc.  Abdominal Pain, Adult Many things can cause abdominal pain. Usually, abdominal pain is not caused by a disease and will improve without treatment. It can often be observed and treated at home. Your health care provider will do a physical exam and possibly order blood tests and X-rays to help determine the seriousness of your pain. However, in many cases, more time must pass before a clear cause of the pain can be found. Before that point, your health care provider may not know if you need more testing or further treatment. HOME CARE INSTRUCTIONS Monitor your abdominal pain for any changes. The following actions may help to alleviate any discomfort you are experiencing:  Only take over-the-counter or prescription medicines as directed by your health care provider.  Do not take laxatives unless directed to do so by your health care provider.  Try a clear liquid diet (broth, tea, or water) as directed  by your health care provider. Slowly move to a bland diet as tolerated. SEEK MEDICAL CARE IF:  You have unexplained abdominal pain.  You have abdominal pain associated with nausea or diarrhea.  You have pain when you urinate or have a bowel movement.  You experience abdominal pain that wakes you in the night.  You have abdominal pain that is worsened or improved by eating food.  You have  abdominal pain that is worsened with eating fatty foods.  You have a fever. SEEK IMMEDIATE MEDICAL CARE IF:  Your pain does not go away within 2 hours.  You keep throwing up (vomiting).  Your pain is felt only in portions of the abdomen, such as the right side or the left lower portion of the abdomen.  You pass bloody or black tarry stools. MAKE SURE YOU:  Understand these instructions.  Will watch your condition.  Will get help right away if you are not doing well or get worse.   This information is not intended to replace advice given to you by your health care provider. Make sure you discuss any questions you have with your health care provider.   Document Released: 06/20/2005 Document Revised: 06/01/2015 Document Reviewed: 05/20/2013 Elsevier Interactive Patient Education [employer].

## 2015-11-20 ENCOUNTER — Encounter: Payer: Self-pay | Admitting: Emergency Medicine

## 2015-11-20 ENCOUNTER — Emergency Department
Admission: EM | Admit: 2015-11-20 | Discharge: 2015-11-20 | Disposition: A | Discharge status: Home / Self Care | Payer: Medicaid Other | Attending: Emergency Medicine | Admitting: Emergency Medicine

## 2015-11-20 DIAGNOSIS — R1012 Left upper quadrant pain: Secondary | ICD-10-CM | POA: Diagnosis not present

## 2015-11-20 DIAGNOSIS — R05 Cough: Secondary | ICD-10-CM | POA: Diagnosis not present

## 2015-11-20 DIAGNOSIS — Z79899 Other long term (current) drug therapy: Secondary | ICD-10-CM | POA: Diagnosis not present

## 2015-11-20 DIAGNOSIS — F17210 Nicotine dependence, cigarettes, uncomplicated: Secondary | ICD-10-CM | POA: Insufficient documentation

## 2015-11-20 DIAGNOSIS — Z760 Encounter for issue of repeat prescription: Secondary | ICD-10-CM | POA: Diagnosis present

## 2015-11-20 DIAGNOSIS — Z933 Colostomy status: Secondary | ICD-10-CM | POA: Diagnosis not present

## 2015-11-20 DIAGNOSIS — R059 Cough, unspecified: Secondary | ICD-10-CM

## 2015-11-20 MED ORDER — OXYCODONE HCL 5 MG PO TABS: 5.0000 mg | ORAL_TABLET | ORAL | Status: DC | PRN

## 2015-11-20 NOTE — ED Notes (Signed)
Was seen by dr. Kerman Passey last night and given an rx but states medicaid will not cover it.

## 2015-11-20 NOTE — Discharge Instructions (Signed)
Your prescription was changed from hydrocodone containing cough syrup to oxycodone tablets.  Return to the department for any new or worsening condition for any fever, new or worsening abdominal pain, cough, trouble breathing, or problems with your ostomy.

## 2015-11-20 NOTE — ED Notes (Addendum)
Call to [facility] [place] [phone] to see what they can substitute - the problem is medicaid does not cover cough medications. Pt states he can't afford even the cheapest which is $20.00. Pt upset that we didn't know how much medication costs and that he had to come back in. States he has cough medication at home but still needs pain medication.

## 2015-11-20 NOTE — ED Notes (Signed)
Pt verbalized understanding of discharge instructions. NAD at this time. 

## 2015-11-20 NOTE — ED Provider Notes (Signed)
[facility] Emergency Department Provider Note   ____________________________________________  Time seen: Approximately 1 PM I have reviewed the triage vital signs and the triage nursing note.  HISTORY  Chief Complaint Medication Refill   Historian Patient  HPI Joshua Mays is a 50 y.o. male is here for change in prescription. Patient was evaluated in the emergency department yesterday for upper respiratory symptoms and coughing resulting in abdominal pain. Because he has a history of prior multiple abdominal surgeries and colostomy, he had a CT scan of his abdomen which was negative/reassuring. His labs weren't normal. Patient was discharged with Tussionex cough syrup. When he went to fill this, his insurance would not cover it and it was too expensive for him.  He is requesting change to pain pill.Patient reports no worsening abdominal pain since yesterday, but he still has pain in his abdomen when he coughs.    Past Medical History  Diagnosis Date  . Ulcerative colitis ([place]) 1990  . Bipolar 1 disorder ([place])   . Depression   . History of substance abuse   . Dysrhythmia     "FLUTTERING"  . GERD (gastroesophageal reflux disease)     NO MEDS-WELL-CONTROLLED  . Hypertension 2011    OFF MEDS SINCE 2014-EATING BETTER AND LOST WEIGHT-BP BETTER PER PT  . Gout   . 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 Postoperative wound infection 07/16/2015  . Bipolar I disorder, most recent episode depressed ([place]) 07/01/2015  . Borderline personality disorder 07/01/2015  . Alcohol abuse 07/01/2015  . Wound infection after surgery 06/25/2015  . Post-operative wound abscess 06/23/2015  . Ventral hernia 06/13/2015  . 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 Hernia, umbilical [id number]  . Affective bipolar disorder ([place]) 05/18/2015  . Chronic pain 05/18/2015  . CC (Crohn's colitis)  ([place]) 05/18/2015  . Clinical depression 05/18/2015  . Failure of erection 05/18/2015  . 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 Personal history of urinary disorder 05/18/2015  . H/O malignant neoplasm 05/18/2015  . H/O: substance abuse 05/18/2015  . Personal history of arthritis 05/18/2015  . BP (high blood pressure) 05/18/2015  . 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 Colitis gravis ([place]) 05/18/2015  . B12 deficiency 05/18/2015  . 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 Hand surgery  2011  . Ileostomy  1990  . Colectomy  1990  . Total colectomy  1990  . Umbilical hernia repair  2001  . Shoulder surgery  2001  . Other surgical history      MULTIPLE ABDOMINAL SURGERIES  . Appendectomy  1990    TAKEN OUT WITH COLECTOMY  . Ventral hernia repair N/A 06/13/2015    Procedure: HERNIA REPAIR VENTRAL ADULT;  Surgeon: Robert Bellow, MD;  Location: [facility] ORS;  Service: General;  Laterality: N/A;  . Wound debridement N/A 06/29/2015    Procedure: DEBRIDEMENT ABDOMINAL WOUND;  Surgeon: Robert Bellow, MD;  Location: [facility] ORS;  Service: General;  Laterality: N/A;  . Minor application of wound vac N/A 06/29/2015    Procedure: MINOR APPLICATION OF WOUND VAC;  Surgeon: Robert Bellow, MD;  Location: [facility] ORS;  Service: General;  Laterality: N/A;  . Minor application of wound vac N/A 07/01/2015  Procedure: WOUND VAC CHANGE;  Surgeon: Robert Bellow, MD;  Location: [facility] ORS;  Service: General;  Laterality: N/A;  . Application of wound vac N/A 07/01/2015    Procedure: APPLICATION OF WOUND VAC;  Surgeon: Robert Bellow, MD;  Location: [facility] ORS;  Service: General;  Laterality: N/A;  . Wound debridement N/A 07/05/2015    Procedure: Delayed primary closure of abdominal wound ;  Surgeon: Robert Bellow, MD;  Location: [facility] ORS;  Service: General;  Laterality: N/A;    Current Outpatient Rx  Name  Route  Sig  Dispense  Refill  . feeding supplement (BOOST / RESOURCE BREEZE) LIQD   Oral   Take 1 Container by mouth daily.         Marland Kitchen ibuprofen (ADVIL,MOTRIN) 200 MG tablet   Oral   Take 200 mg by mouth every 4 (four) hours as needed for mild pain or moderate pain.         Marland Kitchen oxyCODONE (ROXICODONE) 5 MG immediate release tablet   Oral   Take 1 tablet (5 mg total) by mouth every 4 (four) hours as needed for severe pain or breakthrough pain.   10 tablet   0   . potassium chloride 20 MEQ TBCR   Oral   Take 10 mEq by mouth 2 (two) times daily. Patient not taking: Reported on 09/16/2015   20 tablet   0   . QUEtiapine (SEROQUEL XR) 400 MG 24 hr tablet   Oral   Take 1 tablet (400 mg total) by mouth at bedtime.   30 tablet   0     Allergies Cefaclor and Ciprofloxacin  Family History  Problem Relation Age of Onset  . Hyperlipidemia Mother   . Diabetes Father   . Vascular Disease Father   . Alcohol abuse Father   . Depression Father   . Hyperlipidemia Brother   . CVA Maternal Grandfather     Social History Social History  Substance Use Topics  . Smoking status: Current Every Day Smoker -- 0.50 packs/day for 16 years    Types: Cigarettes  . Smokeless tobacco: Never Used  . Alcohol Use: 0.0 oz/week    0 Standard drinks or equivalent per week     Comment: OCC    Review of Systems  Constitutional: Negative for fever. Eyes: Negative for visual changes. ENT: Negative for sore throat. Cardiovascular: Negative for chest pain. Respiratory: Positive for cough. Gastrointestinal: Negative for vomiting and diarrhea. Genitourinary: Negative for dysuria. Musculoskeletal: Negative for back pain. Skin: Negative for rash. Neurological: Negative for headache. 10 point Review of Systems otherwise negative ____________________________________________   PHYSICAL  EXAM:  VITAL SIGNS: ED Triage Vitals  Enc Vitals Group     BP 11/20/15 1223 133/93 mmHg     Pulse Rate 11/20/15 1223 89     Resp 11/20/15 1223 16     Temp 11/20/15 1223 98.2 F (36.8 C)     Temp src --      SpO2 11/20/15 1223 95 %     Weight --      Height --      Head Cir --      Peak Flow --      Pain Score 11/20/15 1217 0     Pain Loc --      Pain Edu? --      Excl. in [place]? --      Constitutional: Alert and oriented. Well appearing and in no distress. HEENT   Head: Normocephalic  and atraumatic.      Eyes: Conjunctivae are normal. PERRL. Normal extraocular movements.      Ears:         Nose: No congestion/rhinnorhea.   Mouth/Throat: Mucous membranes are moist.   Neck: No stridor. Cardiovascular/Chest: Normal rate, regular rhythm.  No murmurs, rubs, or gallops. Respiratory: Normal respiratory effort without tachypnea nor retractions. Mild rhonchi. Gastrointestinal: Soft. No distention, no guarding, no rebound.  Mild tenderness left side abdomen. Colostomy and right-sided abdomen. Genitourinary/rectal:Deferred Musculoskeletal:  Neurologic:  Normal speech and language. No gross or focal neurologic deficits are appreciated. Skin:  Skin is warm, dry and intact. No rash noted. Psychiatric: Mood and affect are normal. Speech and behavior are normal. Patient exhibits appropriate insight and judgment.  ____________________________________________   EKG I, Lisa Roca, MD, the attending physician have personally viewed and interpreted all ECGs.  None ____________________________________________  LABS (pertinent positives/negatives)  None  ____________________________________________  RADIOLOGY All Xrays were viewed by me. Imaging interpreted by Radiologist.  None __________________________________________  PROCEDURES  Procedure(s) performed: None  Critical Care performed: None  ____________________________________________   ED COURSE / ASSESSMENT  AND PLAN  Pertinent labs & imaging results that were available during my care of the patient were reviewed by me and considered in my medical decision making (see chart for details).   Also reviewed the patient's complete workup for abdominal pain and cough yesterday. I reviewed his prescription which was for Tussionex containing hydrocodone for 14 days. I will change the prescription to oxycodone, 10 tablets.  Patient is not having any new or worsening symptoms, just persistent pain which I do suspect is due to a pulled muscle from coughing in the abdomen.    CONSULTATIONS:   None   Patient / Family / Caregiver informed of clinical course, medical decision-making process, and agree with plan.   I discussed return precautions, follow-up instructions, and discharged instructions with patient and/or family.   ___________________________________________   FINAL CLINICAL IMPRESSION(S) / ED DIAGNOSES   Final diagnoses:  Left upper quadrant pain  Cough              Note: This dictation was prepared with Dragon dictation. Any transcriptional errors that result from this process are unintentional   Lisa Roca, MD 11/20/15 1330

## 2015-12-08 ENCOUNTER — Encounter: Payer: Self-pay | Admitting: General Surgery

## 2015-12-08 ENCOUNTER — Ambulatory Visit (INDEPENDENT_AMBULATORY_CARE_PROVIDER_SITE_OTHER): Payer: Medicaid Other | Admitting: General Surgery

## 2015-12-08 DIAGNOSIS — K469 Unspecified abdominal hernia without obstruction or gangrene: Secondary | ICD-10-CM

## 2015-12-08 NOTE — Patient Instructions (Addendum)
Follow up appointment to be announced.  

## 2015-12-08 NOTE — Progress Notes (Signed)
Patient ID: Joshua Mays, male   DOB: [birth year], 50 y.o.   MRN: [id number]  Chief Complaint  Patient presents with  . Other    redness at hernia site    HPI Joshua Mays is a 50 y.o. male here today to discuss redness around the ventral hernia site. He noticed this about three weeks ago after a violent episode coughing episode attributed to the flu. He had thought he felt something polar pop in the left lower quadrant prompting him to present to the ED on February 27 for evaluation. The redness has resolved but he had noticed some swelling near the stoma and call today for assessment. Considering his past history of wound infection after a retrorectus repair of a ventral hernia last fall, he was seen the day he called.   HPI   Past Medical History  Diagnosis Date  . Ulcerative colitis ([place]) 1990  . Bipolar 1 disorder ([place])   . Depression   . History of substance abuse   . Dysrhythmia     "FLUTTERING"  . GERD (gastroesophageal reflux disease)     NO MEDS-WELL-CONTROLLED  . Hypertension 2011    OFF MEDS SINCE 2014-EATING BETTER AND LOST WEIGHT-BP BETTER PER PT  . Gout   . 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 Hand surgery  2011  . Ileostomy  1990  . Colectomy  1990  . Total colectomy  1990  . Umbilical hernia repair  2001  . Shoulder surgery  2001  . Other surgical history      MULTIPLE ABDOMINAL SURGERIES  . Appendectomy  1990    TAKEN OUT WITH COLECTOMY  . Ventral hernia repair N/A 06/13/2015    Procedure: HERNIA REPAIR VENTRAL ADULT;  Surgeon: Robert Bellow, MD;  Location: [facility] ORS;  Service: General;  Laterality: N/A;  . Wound debridement N/A 06/29/2015    Procedure: DEBRIDEMENT ABDOMINAL WOUND;  Surgeon: Robert Bellow, MD;  Location: [facility] ORS;  Service: General;  Laterality: N/A;  . Minor application of wound vac N/A 06/29/2015    Procedure: MINOR APPLICATION OF WOUND VAC;   Surgeon: Robert Bellow, MD;  Location: [facility] ORS;  Service: General;  Laterality: N/A;  . Minor application of wound vac N/A 07/01/2015    Procedure: WOUND VAC CHANGE;  Surgeon: Robert Bellow, MD;  Location: [facility] OR[facility];  Service: General;  Laterality: N/A;  . Application of wound vac N/A 07/01/2015    Procedure: APPLICATION OF WOUND VAC;  Surgeon: Robert Bellow, MD;  Location: [facility] ORS;  Service: General;  Laterality: N/A;  . Wound debridement N/A 07/05/2015    Procedure: Delayed primary closure of abdominal wound ;  Surgeon: Robert Bellow, MD;  Location: [facility] ORS;  Service: General;  Laterality: N/A;    Family History  Problem Relation Age of Onset  . Hyperlipidemia Mother   . Diabetes Father   . Vascular Disease Father   . Alcohol abuse Father   . Depression Father   . Hyperlipidemia Brother   . CVA Maternal Grandfather     Social History Social History  Substance Use Topics  . Smoking status: Current Every Day Smoker -- 0.50 packs/day for 16 years    Types: Cigarettes  . Smokeless tobacco: Never Used  . Alcohol Use: 0.0 oz/week    0 Standard drinks or equivalent per week     Comment: [place]  Allergies  Allergen Reactions  . Cefaclor Rash  . Ciprofloxacin Rash    Current Outpatient Prescriptions  Medication Sig Dispense Refill  . feeding supplement (BOOST / RESOURCE BREEZE) LIQD Take 1 Container by mouth daily.    Marland Kitchen ibuprofen (ADVIL,MOTRIN) 200 MG tablet Take 200 mg by mouth every 4 (four) hours as needed for mild pain or moderate pain.    Marland Kitchen oxyCODONE (OXY IR/ROXICODONE) 5 MG immediate release tablet     . potassium chloride 20 MEQ TBCR Take 10 mEq by mouth 2 (two) times daily. 20 tablet 0  . QUEtiapine (SEROQUEL XR) 400 MG 24 hr tablet Take 1 tablet (400 mg total) by mouth at bedtime. 30 tablet 0   No current facility-administered medications for this visit.    Review of Systems Review of Systems  Constitutional: Negative.   Respiratory: Negative.    Cardiovascular: Negative.     Blood pressure 148/94, pulse 92, temperature 98.6 F (37 C), temperature source Oral, resp. rate 16, height 5\' 11"  (1.803 m), weight 215 lb (97.523 kg).  Physical Exam Physical Exam  Constitutional: He is oriented to person, place, and time. He appears well-developed and well-nourished.  Eyes: Conjunctivae are normal. No scleral icterus.  Neck: Neck supple.  Cardiovascular: Normal rate, regular rhythm and normal heart sounds.   Pulmonary/Chest: Effort normal and breath sounds normal.  Abdominal: Soft. Normal appearance and bowel sounds are normal. There is no tenderness. A hernia is present.    Lymphadenopathy:    He has no cervical adenopathy.  Neurological: He is alert and oriented to person, place, and time.  Skin: Skin is warm and dry.    Data Reviewed CT of the abdomen and pelvis from October 2016 February 2017 reviewed. Inflammatory changes along midline wound have resolved. Unchanged peristomal hernia with non-dilated small bowel.  Assessment    No evidence of recurrent midline hernia or wound infection. Stable peristomal hernia, now symptomatic.    Plan    [facility] referral for peristomal hernia repair.     PCP:  Joshua Mays  This information has been scribed by [name] CMA.    Robert Bellow 12/09/2015, 7:35 AM

## 2015-12-09 DIAGNOSIS — K469 Unspecified abdominal hernia without obstruction or gangrene: Secondary | ICD-10-CM | POA: Insufficient documentation

## 2015-12-09 SURGERY — IRRIGATION AND DEBRIDEMENT ABDOMEN
Anesthesia: Choice

## 2015-12-13 DIAGNOSIS — Z9889 Other specified postprocedural states: Secondary | ICD-10-CM | POA: Insufficient documentation

## 2015-12-13 DIAGNOSIS — Z8719 Personal history of other diseases of the digestive system: Secondary | ICD-10-CM | POA: Insufficient documentation

## 2015-12-13 DIAGNOSIS — K9419 Other complications of enterostomy: Secondary | ICD-10-CM | POA: Insufficient documentation

## 2015-12-13 DIAGNOSIS — K435 Parastomal hernia without obstruction or  gangrene: Secondary | ICD-10-CM | POA: Insufficient documentation

## 2015-12-22 ENCOUNTER — Ambulatory Visit (INDEPENDENT_AMBULATORY_CARE_PROVIDER_SITE_OTHER): Payer: Medicaid Other | Admitting: Family Medicine

## 2015-12-22 ENCOUNTER — Encounter: Payer: Self-pay | Admitting: Family Medicine

## 2015-12-22 VITALS — BP 108/72 | HR 78 | Temp 98.4°F | Resp 16 | Wt 220.0 lb

## 2015-12-22 DIAGNOSIS — K1120 Sialoadenitis, unspecified: Secondary | ICD-10-CM

## 2015-12-22 DIAGNOSIS — J029 Acute pharyngitis, unspecified: Secondary | ICD-10-CM

## 2015-12-22 LAB — POCT RAPID STREP A (OFFICE): RAPID STREP A SCREEN: NEGATIVE

## 2015-12-22 MED ORDER — AMOXICILLIN 500 MG PO CAPS: 1000.0000 mg | ORAL_CAPSULE | Freq: Two times a day (BID) | ORAL | Status: DC

## 2015-12-22 NOTE — Progress Notes (Signed)
Patient ID: Joshua Mays, male   DOB: Sep 01, 1966, 50 y.o.   MRN: [id number]    Subjective:  HPI Pt is here for neck swelling and pain. He reports that both side of his neck are swollen. It is better today but yesterday it looked like someone punched him in the face, his neck was so swollen. He also reports that he is tender under his arms as well. He reports that he has been a little more tired that usual and has had some allergy symptoms lately with the pollen. He saw his oncologist yesterday at [facility] and he told him he needed to see his PCP.  Prior to Admission medications   Medication Sig Start Date End Date Taking? Authorizing Provider  ibuprofen (ADVIL,MOTRIN) 200 MG tablet Take 200 mg by mouth every 4 (four) hours as needed for mild pain or moderate pain.   Yes Historical Provider, MD  oxyCODONE (OXY IR/ROXICODONE) 5 MG immediate release tablet  11/20/15  Yes Historical Provider, MD  QUEtiapine (SEROQUEL XR) 400 MG 24 hr tablet Take 1 tablet (400 mg total) by mouth at bedtime. 10/12/15  Yes Carmon Ginsberg, PA  feeding supplement (BOOST / RESOURCE BREEZE) LIQD Take 1 Container by mouth daily. Reported on 12/22/2015    Historical Provider, MD  potassium chloride 20 MEQ TBCR Take 10 mEq by mouth 2 (two) times daily. Patient not taking: Reported on 12/22/2015 05/12/15   Melvenia Needles, PA-C    Patient Active Problem List   Diagnosis Date Noted  . Peristomal hernia 12/09/2015  . Postoperative wound infection 07/16/2015  . Bipolar I disorder, most recent episode depressed ([place]) 07/01/2015  . Borderline personality disorder 07/01/2015  . Alcohol abuse 07/01/2015  . Wound infection after surgery 06/25/2015  . Post-operative wound abscess 06/23/2015  . Ventral hernia 06/13/2015  . 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 Hernia, umbilical [id number]  . Affective bipolar disorder ([place]) 05/18/2015  . Chronic pain 05/18/2015  . CC (Crohn's colitis) ([place]) 05/18/2015  . Clinical depression  05/18/2015  . Failure of erection 05/18/2015  . 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 Personal history of urinary disorder 05/18/2015  . H/O malignant neoplasm 05/18/2015  . H/O: substance abuse 05/18/2015  . Personal history of arthritis 05/18/2015  . BP (high blood pressure) 05/18/2015  . 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 Colitis gravis ([place]) 05/18/2015  . B12 deficiency 05/18/2015  . 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 Ulcerative colitis ([place]) 1990  . Bipolar 1 disorder ([place])   . Depression   . History of substance abuse   . Dysrhythmia     "FLUTTERING"  . GERD (gastroesophageal reflux disease)     NO MEDS-WELL-CONTROLLED  . Hypertension 2011    OFF MEDS SINCE 2014-EATING BETTER AND LOST WEIGHT-BP BETTER PER PT  . Gout   . 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 Marital Status: Single    Spouse Name: N/A  . Number of Children: N/A  . Years of Education: N/A   Occupational History  . Not on file.   Social History Main Topics  . Smoking status: Former Smoker -- 0.50 packs/day for 16 years    Types: Cigarettes  . Smokeless tobacco: Never Used  Comment: has been quit "couple of months"  . Alcohol Use: 0.0 oz/week    0 Standard drinks or equivalent per week     Comment: OCC  . Drug Use: No  . Sexual Activity: Not on file   Other Topics Concern  . Not on file   Social History Narrative    Allergies  Allergen Reactions  . Cefaclor Rash  . Ciprofloxacin Rash    Review of Systems  Constitutional: Positive for malaise/fatigue.  HENT: Negative.   Eyes: Negative.   Respiratory: Negative.   Cardiovascular: Negative.   Gastrointestinal: Negative.   Genitourinary: Negative.   Musculoskeletal: Negative.    Skin: Negative.   Neurological: Negative.   Endo/Heme/Allergies: Negative.   Psychiatric/Behavioral: Negative.     There is no immunization history for the selected administration types on file for this patient. Objective:  BP 108/72 mmHg  Pulse 78  Temp(Src) 98.4 F (36.9 C) (Oral)  Resp 16  Wt 220 lb (99.791 kg)  Physical Exam  Constitutional: He is oriented to person, place, and time and well-developed, well-nourished, and in no distress.  HENT:  Head: Normocephalic and atraumatic.  Right Ear: External ear normal.  Left Ear: External ear normal.  Nose: Nose normal.  Mouth/Throat: Oropharynx is clear and moist.  Diffuse swelling bilaterally of the parotid glands without erythema or tenderness in this area.  Eyes: Conjunctivae and EOM are normal. Pupils are equal, round, and reactive to light.  Neck: Neck supple.  Cardiovascular: Normal rate, regular rhythm, normal heart sounds and intact distal pulses.   Pulmonary/Chest: Effort normal and breath sounds normal.  Abdominal: Soft.  Lymphadenopathy:    He has no cervical adenopathy.  Neurological: He is alert and oriented to person, place, and time. No cranial nerve deficit. Gait normal.  Skin: Skin is warm and dry. No rash noted. No erythema.  Psychiatric: Mood, memory, affect and judgment normal.    Lab Results  Component Value Date   WBC 8.7 11/19/2015   HGB 16.1 11/19/2015   HCT 47.1 11/19/2015   PLT 270 11/19/2015   GLUCOSE 106* 11/19/2015   CHOL 159 07/29/2013   TRIG 207* 07/29/2013   HDL 69 07/29/2013   LDLCALC 49 07/29/2013   TSH 0.77 10/14/2014   PSA 1.5 12/17/2013   INR 1.1 06/11/2012    CMP     Component Value Date/Time   NA 134* 11/19/2015 1652   NA 137 10/14/2014   NA 133* 04/26/2013 1029   K 3.9 11/19/2015 1652   K 3.9 04/26/2013 1029   CL 100* 11/19/2015 1652   CL 101 04/26/2013 1029   CO2 24 11/19/2015 1652   CO2 24 04/26/2013 1029   GLUCOSE 106* 11/19/2015 1652   GLUCOSE 110*  04/26/2013 1029   BUN 9 11/19/2015 1652   BUN 13 10/14/2014   BUN 13 04/26/2013 1029   CREATININE 0.98 11/19/2015 1652   CREATININE 1.2 10/14/2014   CREATININE 1.07 04/26/2013 1029   CALCIUM 9.1 11/19/2015 1652   CALCIUM 9.7 04/26/2013 1029   PROT 8.4* 11/19/2015 1652   PROT 8.7* 04/26/2013 1029   ALBUMIN 3.8 11/19/2015 1652   ALBUMIN 3.7 04/26/2013 1029   AST 43* 11/19/2015 1652   AST 26 04/26/2013 1029   ALT 38 11/19/2015 1652   ALT 29 04/26/2013 1029   ALKPHOS 121 11/19/2015 1652   ALKPHOS 136 04/26/2013 1029   BILITOT 0.8 11/19/2015 1652   BILITOT 1.5* 04/26/2013 1029   GFRNONAA >60 11/19/2015 1652   GFRNONAA >60  04/26/2013 1029   GFRAA >60 11/19/2015 1652   GFRAA >60 04/26/2013 1029    Assessment and Plan :  1. Parotiditis Cover for bacterial infection but this could be the mumps. We'll check lab work for immunity. He is not sick today, he just has a swelling of the parotid glands. We will follow up with him early next week. - CBC With Differential/Platelet - Amylase - Comprehensive metabolic panel - Sedimentation rate - Mumps antibody, IgG - Mumps Antibody, IgM - Measles/Mumps/Rubella Immunity - HIV antibody (with reflex) - amoxicillin (AMOXIL) 500 MG capsule; Take 2 capsules (1,000 mg total) by mouth 2 (two) times daily.  Dispense: 30 capsule; Refill: 0 Follow-up next week. 2. Sore throat Likely viral with negative strep - POCT rapid strep A--negative 3. Sarcoma of the hand Followed by oncology 4. Bipolar depression/history of substance abuse Treated by psychiatry I have done the exam and reviewed the above chart and it is accurate to the best of my knowledge.  Miguel Aschoff MD [facility] 12/22/2015 11:45 AM

## 2015-12-27 LAB — MEASLES/MUMPS/RUBELLA IMMUNITY
RUBEOLA AB, IGG: 49.9 [AU]/ml (ref >29.9)
Rubella Antibodies, IGG: 25.9 index (ref >0.99)

## 2015-12-27 LAB — COMPREHENSIVE METABOLIC PANEL
ALK PHOS: 110 IU/L (ref 39–117)
ALT: 28 IU/L (ref 0–44)
AST: 18 IU/L (ref 0–40)
Albumin/Globulin Ratio: 1.3 (ref 1.2–2.2)
Albumin: 4 g/dL (ref 3.5–5.5)
BILIRUBIN TOTAL: 0.5 mg/dL (ref 0.0–1.2)
BUN/Creatinine Ratio: 8 — ABNORMAL LOW (ref 9–20)
BUN: 8 mg/dL (ref 6–24)
CHLORIDE: 102 mmol/L (ref 96–106)
CO2: 19 mmol/L (ref 18–29)
Calcium: 9.4 mg/dL (ref 8.7–10.2)
Creatinine, Ser: 0.99 mg/dL (ref 0.76–1.27)
GFR calc non Af Amer: 88 mL/min/{1.73_m2} (ref >59)
GFR, EST AFRICAN AMERICAN: 102 mL/min/{1.73_m2} (ref >59)
Globulin, Total: 3 g/dL (ref 1.5–4.5)
Glucose: 98 mg/dL (ref 65–99)
POTASSIUM: 4.7 mmol/L (ref 3.5–5.2)
Sodium: 141 mmol/L (ref 134–144)
TOTAL PROTEIN: 7 g/dL (ref 6.0–8.5)

## 2015-12-27 LAB — AMYLASE: AMYLASE: 63 U/L (ref 31–124)

## 2015-12-27 LAB — MUMPS ANTIBODY, IGM: Mumps IgM: <0.8 AU (ref 0.00–0.79)

## 2015-12-27 LAB — SEDIMENTATION RATE: SED RATE: 27 mm/h (ref 0–30)

## 2015-12-27 LAB — HIV ANTIBODY (ROUTINE TESTING W REFLEX): HIV SCREEN 4TH GENERATION: NONREACTIVE

## 2015-12-28 ENCOUNTER — Telehealth: Payer: Self-pay

## 2015-12-28 DIAGNOSIS — R599 Enlarged lymph nodes, unspecified: Secondary | ICD-10-CM

## 2015-12-28 NOTE — Telephone Encounter (Signed)
Left message to call back  

## 2015-12-28 NOTE — Telephone Encounter (Signed)
-----   Message from Jerrol Banana., MD sent at 12/28/2015 10:50 AM EDT ----- Labs okay. Patient appears to be immune to the mumps.ENT referral if glands on face still swollen

## 2015-12-29 ENCOUNTER — Ambulatory Visit: Payer: Self-pay | Admitting: Family Medicine

## 2016-01-02 NOTE — Telephone Encounter (Signed)
Pt advised and he states he is still swollen, wants to proceed with referral. order put in-aa

## 2016-01-02 NOTE — Telephone Encounter (Signed)
lmtcb on teh second number in the chart the first one is not a working number-aa

## 2016-01-24 ENCOUNTER — Other Ambulatory Visit: Payer: Self-pay | Admitting: Otolaryngology

## 2016-01-24 DIAGNOSIS — R220 Localized swelling, mass and lump, head: Secondary | ICD-10-CM

## 2016-01-24 DIAGNOSIS — R221 Localized swelling, mass and lump, neck: Secondary | ICD-10-CM

## 2016-01-24 DIAGNOSIS — R609 Edema, unspecified: Secondary | ICD-10-CM

## 2016-01-30 ENCOUNTER — Ambulatory Visit
Admission: RE | Admit: 2016-01-30 | Discharge: 2016-01-30 | Disposition: A | Discharge status: Home / Self Care | Payer: Medicaid Other | Source: Ambulatory Visit | Attending: Otolaryngology | Admitting: Otolaryngology

## 2016-01-30 DIAGNOSIS — R221 Localized swelling, mass and lump, neck: Secondary | ICD-10-CM | POA: Diagnosis present

## 2016-01-30 DIAGNOSIS — R220 Localized swelling, mass and lump, head: Secondary | ICD-10-CM | POA: Diagnosis present

## 2016-01-30 DIAGNOSIS — K111 Hypertrophy of salivary gland: Secondary | ICD-10-CM | POA: Insufficient documentation

## 2016-01-30 DIAGNOSIS — R609 Edema, unspecified: Secondary | ICD-10-CM

## 2016-01-30 MED ORDER — IOPAMIDOL (ISOVUE-300) INJECTION 61%
75.0000 mL | Freq: Once | INTRAVENOUS | Status: AC | PRN
  Administered 2016-01-30: 75 mL via INTRAVENOUS

## 2016-02-24 ENCOUNTER — Telehealth: Payer: Self-pay | Admitting: Family Medicine

## 2016-02-24 ENCOUNTER — Other Ambulatory Visit: Payer: Self-pay | Admitting: Family Medicine

## 2016-02-24 DIAGNOSIS — F319 Bipolar disorder, unspecified: Secondary | ICD-10-CM

## 2016-02-24 MED ORDER — QUETIAPINE FUMARATE ER 400 MG PO TB24: 400.0000 mg | ORAL_TABLET | Freq: Every day | ORAL | Status: DC

## 2016-02-24 NOTE — Telephone Encounter (Signed)
Patient has been advised. KW 

## 2016-02-24 NOTE — Telephone Encounter (Signed)
I have sent in 7 pills.

## 2016-02-24 NOTE — Telephone Encounter (Signed)
Please review and advise, KW

## 2016-02-24 NOTE — Telephone Encounter (Signed)
Pt has an appt. Next Friday with Dr. Holley Raring at  [facility].  He takes Seroquel 400mg  that is prescribed by Dr. Holley Raring.  He said he was going to run out and he said to ask you if you will prescribe enough for him until his next appt.  He said they will not do this for him.  He said we have done this before?    He ask to put this request with Mikki Santee.  He uses [facility]  His call back is   [phone]  Thanks Con Memos

## 2016-03-21 ENCOUNTER — Telehealth: Payer: Self-pay | Admitting: Family Medicine

## 2016-03-21 DIAGNOSIS — K1120 Sialoadenitis, unspecified: Secondary | ICD-10-CM

## 2016-03-21 NOTE — Telephone Encounter (Signed)
Please enter referral to rheumatology in [facility]

## 2016-03-21 NOTE — Telephone Encounter (Signed)
Please review-aa 

## 2016-03-21 NOTE — Telephone Encounter (Signed)
Done-aa 

## 2016-03-21 NOTE — Telephone Encounter (Signed)
Ok to do referral. thx

## 2016-03-21 NOTE — Telephone Encounter (Signed)
Abby from Dr Vaught's office called to check on referral for pt to see a rheumatologist for submandibular gland enlargement without abnormal findings.Records have been sent back to you

## 2016-03-21 NOTE — Telephone Encounter (Signed)
please review, do I need to do anything else for this?-aa

## 2016-04-27 ENCOUNTER — Encounter: Payer: Self-pay | Admitting: Family Medicine

## 2016-05-06 IMAGING — CT CT ABD-PELV W/ CM
1 of 3 series · 14 of 32 positions shown, 18 images · IV contrast (omnipaque)
Comparison: None.

CLINICAL DATA: Pt states he had surgery approx. 2 weeks ago. He had
pain at incision site, and went to the doctor, where they performed
a drainage. HX: Colostomy 8660, HTN, Ulcerative Colitis.
TECHNIQUE: Multidetector CT imaging of the abdomen and pelvis was performed
using the standard protocol following bolus administration of
intravenous contrast.

CONTRAST:  100mL OMNIPAQUE IOHEXOL 300 MG/ML  SOLN

[Series 2: routine abd pel with · axial · 0.81mm/px · z∈[-498,-38]mm · 14 of 102 slices shown, 18 images]
[im 5/102  soft-tissue]
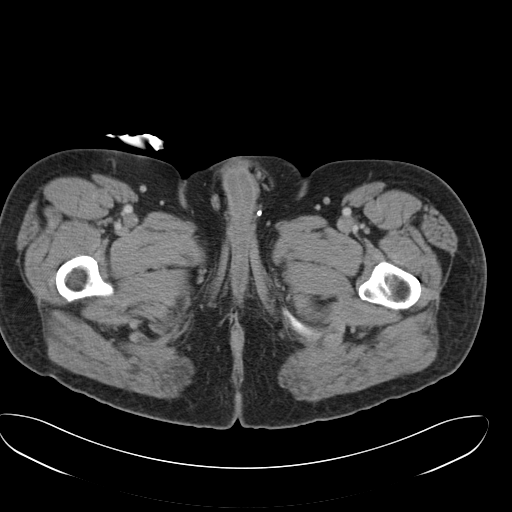
[im 5/102  bone]
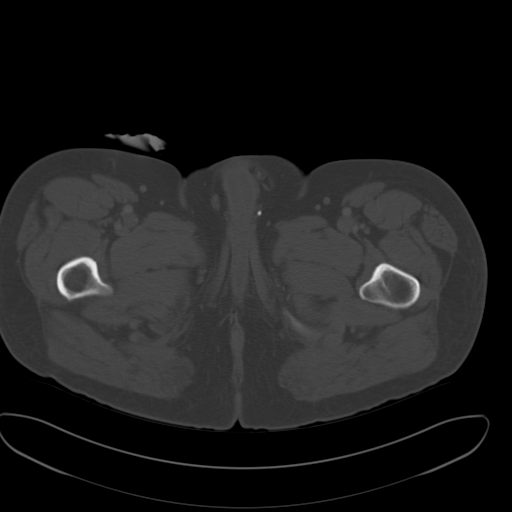
[im 15/102  soft-tissue]
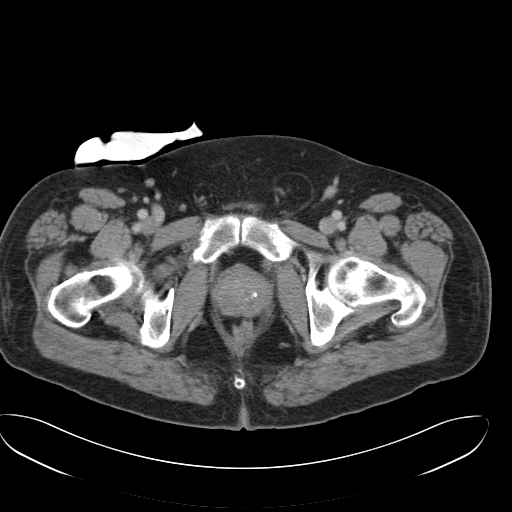
[im 25/102  soft-tissue]
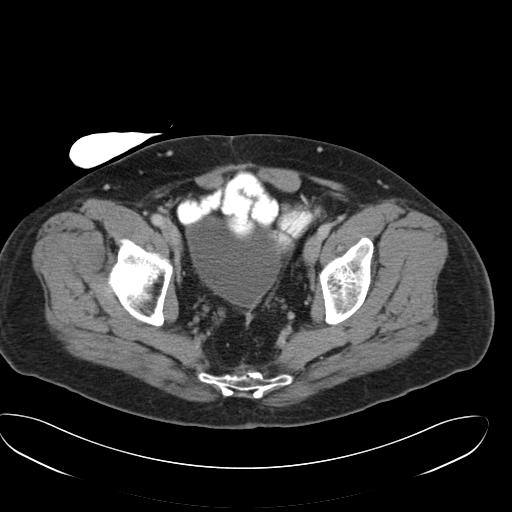
[im 29/102  soft-tissue]
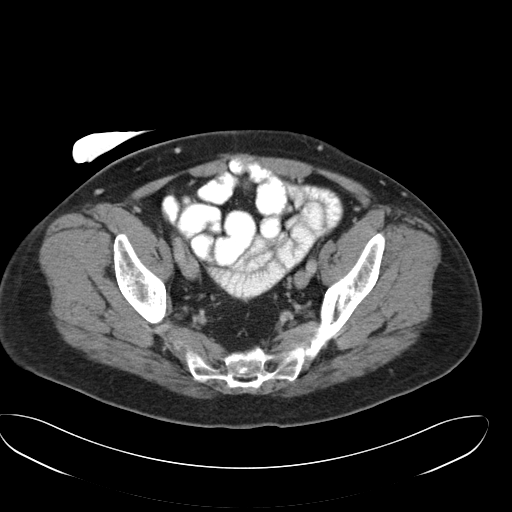
[im 39/102  soft-tissue]
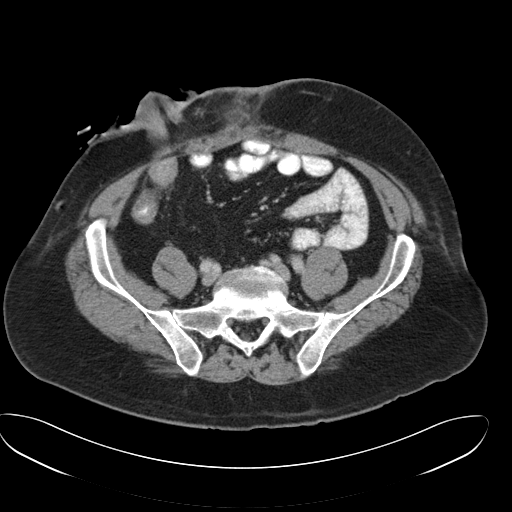
[im 49/102  soft-tissue]
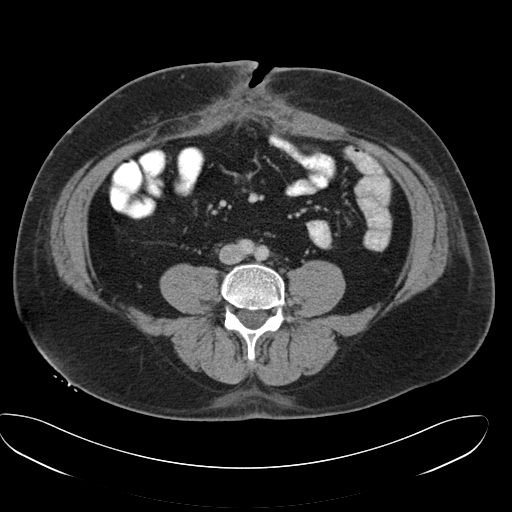
[im 53/102  soft-tissue]
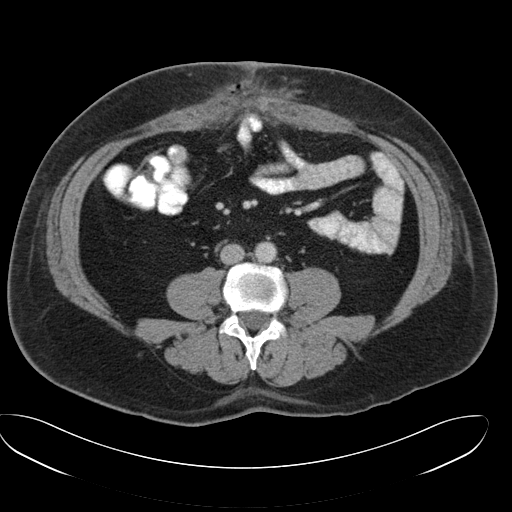
[im 63/102  soft-tissue]
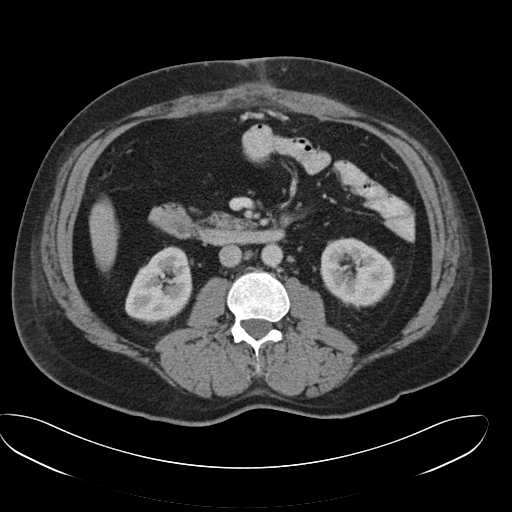
[im 73/102  soft-tissue]
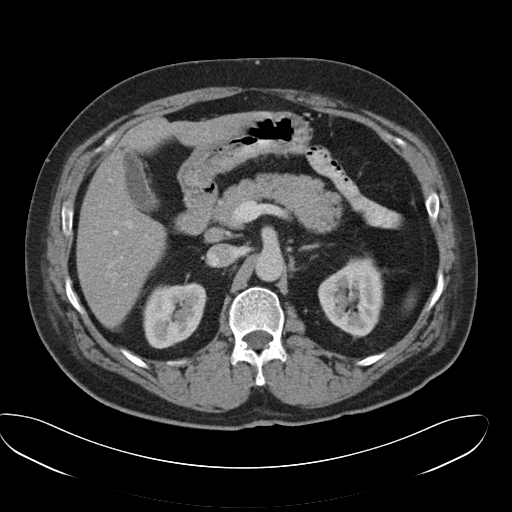
[im 73/102  bone]
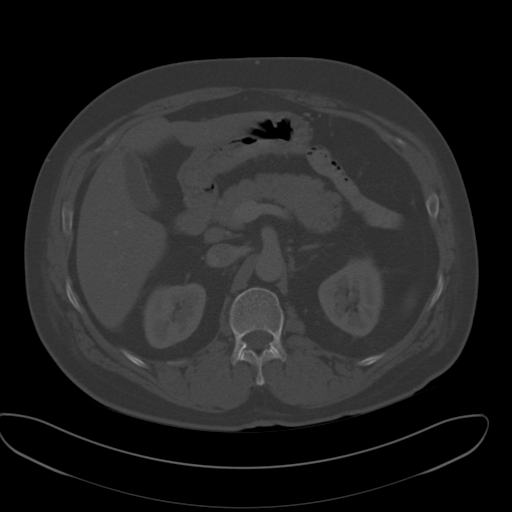
[im 77/102  soft-tissue]
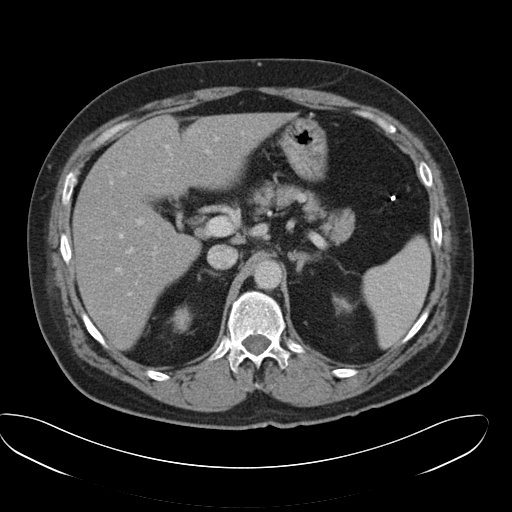
[im 82/102  lung]
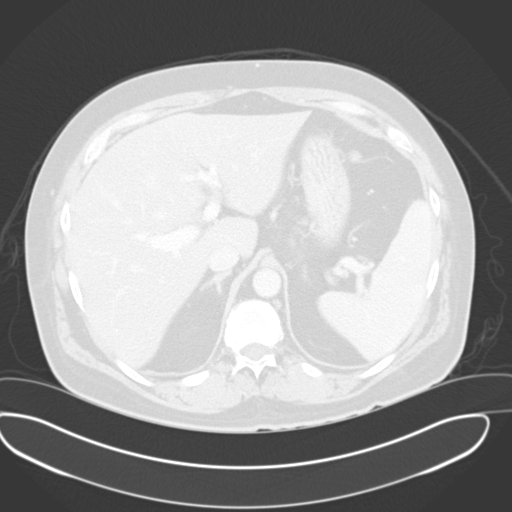
[im 87/102  soft-tissue]
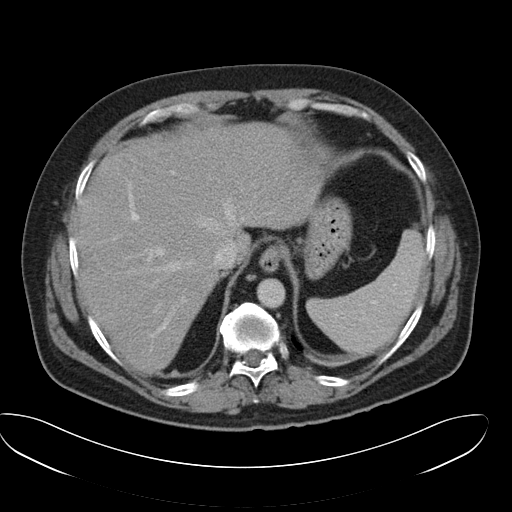
[im 87/102  lung]
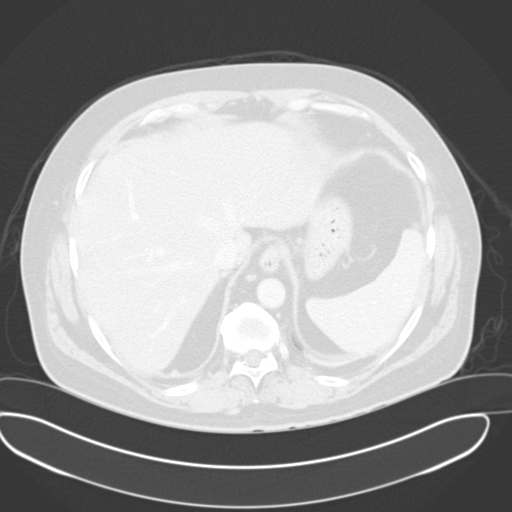
[im 92/102  lung]
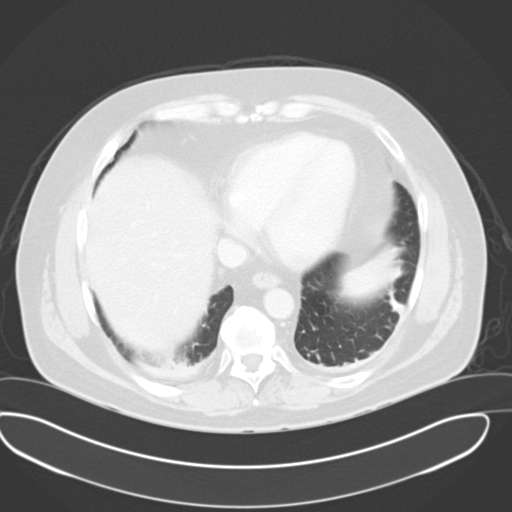
[im 97/102  soft-tissue]
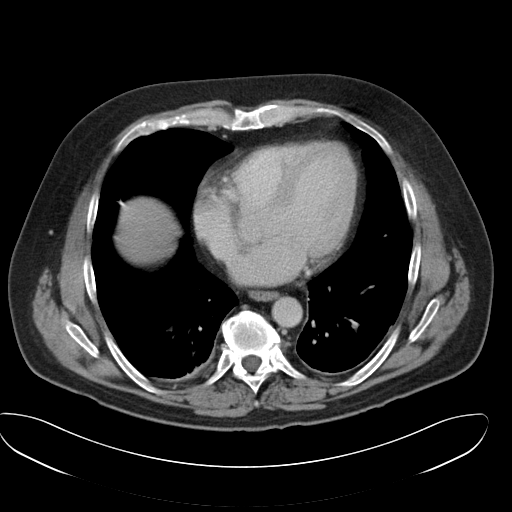
[im 97/102  lung]
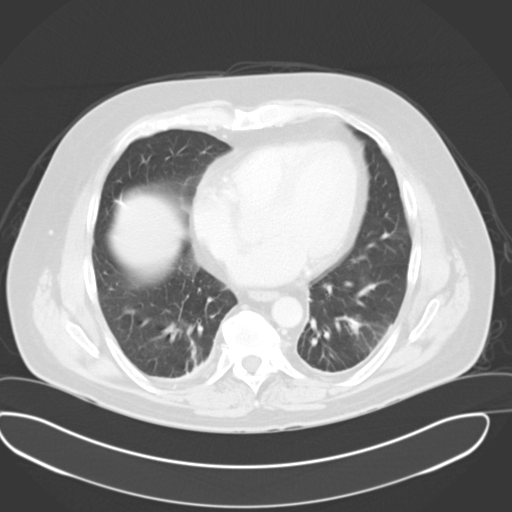

[14 of 32 positions shown; findings below may reference images not displayed]

48 y.o. male had surgery some 12 days ago to repair of multiple
ventral hernias. Mesh was placed deep to the rectus muscles. He was
kept in the hospital for about 2 nights for pain control. He reports
that after discharge did have some drainage from the wound.

EXAM:
CT ABDOMEN AND PELVIS WITH CONTRAST
FINDINGS: Abdominal wall: There is a defect along the anterior abdominal wall
reflecting an open incision from the skin to the deep subcutaneous
fat. This is bordered by hazy and reticular type opacity consistent
with inflammation. A few small bubbles of air are seen in the deep
subcutaneous fat above this, anterior to the superficial muscle
fascia. Along the lower central abdominal musculature, there is an
ill-defined fluid collection measuring 2.6 x 1.9 cm in greatest
transverse dimension. This lies between the rectus abdominus
muscles, superficial to the peritoneal cavity and contiguous with
the superficial muscle fascia. There is no definite residual hernia.
There is no other fluid collection. An ileostomy protrudes through
the right lower quadrant abdominal wall, stable from the prior CT.

Gastrointestinal: Patient has had a total colectomy the dissection
of the rectum. These findings are stable. Small bowel is normal in
caliber with no wall thickening or fold thickening or mesenteric
inflammation. Stomach is unremarkable.

Lung bases: Mild dependent subsegmental atelectasis in the lower
lobes. Heart normal in size.

Liver: Diffuse fatty infiltration. Normal in size and morphology. No
mass or focal lesion.

Spleen, gallbladder, pancreas, adrenal glands:  Unremarkable.

Kidneys, ureters, bladder:  Unremarkable.

Lymph nodes: Few mildly prominent nodes along the gastrohepatic
ligament, largest measuring 12 mm in short axis. These are stable.
No other adenopathy.

Ascites:  None.

Musculoskeletal: Mild degenerative changes of the lower thoracic and
lower lumbar spine. No osteoblastic or osteolytic lesions.
IMPRESSION: 1. Small ill-defined fluid collection is seen along the deep
inferior margin of the anterior abdominal wall incision line
measuring 2.6 cm in greatest dimension. This lies between the rectus
abdominus muscles.
2. Inflammatory changes are noted along the incision line as well as
small bubbles of air. No other fluid collections.
3. No evidence of a recurrent hernia.  No other acute findings.
4. Chronic changes include changes from a total colectomy and
resection of the rectum, formation of a right lower quadrant
ileostomy and hepatic steatosis and mild reactive gastrohepatic
ligament adenopathy.

## 2016-05-24 ENCOUNTER — Emergency Department
Admission: EM | Admit: 2016-05-24 | Discharge: 2016-05-24 | Disposition: A | Discharge status: Home / Self Care | Payer: Medicaid Other | Attending: Emergency Medicine | Admitting: Emergency Medicine

## 2016-05-24 ENCOUNTER — Encounter: Payer: Self-pay | Admitting: Emergency Medicine

## 2016-05-24 DIAGNOSIS — Z760 Encounter for issue of repeat prescription: Secondary | ICD-10-CM | POA: Insufficient documentation

## 2016-05-24 DIAGNOSIS — Z87891 Personal history of nicotine dependence: Secondary | ICD-10-CM | POA: Insufficient documentation

## 2016-05-24 DIAGNOSIS — I10 Essential (primary) hypertension: Secondary | ICD-10-CM | POA: Insufficient documentation

## 2016-05-24 DIAGNOSIS — Z85828 Personal history of other malignant neoplasm of skin: Secondary | ICD-10-CM | POA: Insufficient documentation

## 2016-05-24 DIAGNOSIS — R110 Nausea: Secondary | ICD-10-CM | POA: Insufficient documentation

## 2016-05-24 DIAGNOSIS — F1523 Other stimulant dependence with withdrawal: Secondary | ICD-10-CM | POA: Diagnosis not present

## 2016-05-24 DIAGNOSIS — F1593 Other stimulant use, unspecified with withdrawal: Secondary | ICD-10-CM

## 2016-05-24 MED ORDER — QUETIAPINE FUMARATE 200 MG PO TABS
400.0000 mg | ORAL_TABLET | Freq: Once | ORAL | Status: DC
  Filled 2016-05-24: qty 2

## 2016-05-24 MED ORDER — QUETIAPINE FUMARATE 400 MG PO TABS: 400.0000 mg | ORAL_TABLET | Freq: Every day | ORAL | 0 refills | Status: DC

## 2016-05-24 NOTE — ED Notes (Signed)
Patient here for medication refill. Patient has an appointment with [facility] on 09/07 but he states, "I cannot wait that long". Patient c/o N/V and insomnia. Denies HI or SI.

## 2016-05-24 NOTE — ED Provider Notes (Signed)
[facility] Emergency Department Provider Note   ____________________________________________   First MD Initiated Contact with Patient 05/24/16 1332     (approximate)  I have reviewed the triage vital signs and the nursing notes.   HISTORY  Chief Complaint Medication Refill   HPI Joshua Mays is a 50 y.o. 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 He says that he has also had nausea with dry heaves. He denies any abdominal pain. Says that he has had a similar reaction after stopping his Seroquel. Has an appointment with [facility] coming up on September 7 for further follow-up. Denies any suicidal or homicidal ideation.   Past Medical History:  Diagnosis Date  . Bipolar 1 disorder ([place])   . Depression   . Dysrhythmia    "FLUTTERING"  . GERD (gastroesophageal reflux disease)    NO MEDS-WELL-CONTROLLED  . Gout   . History of substance abuse   . Hypertension 2011   OFF MEDS SINCE 2014-EATING BETTER AND LOST WEIGHT-BP BETTER PER PT  . 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 Ulcerative colitis ([place]) 1990    Patient Active Problem List   Diagnosis Date Noted  . Peristomal hernia 12/09/2015  . Postoperative wound infection 07/16/2015  . Bipolar I disorder, most recent episode depressed ([place]) 07/01/2015  . Borderline personality disorder 07/01/2015  . Alcohol abuse 07/01/2015  . Wound infection after surgery 06/25/2015  . Post-operative wound abscess 06/23/2015  . Ventral hernia 06/13/2015  . 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 Hernia, umbilical [id number]  . Affective bipolar disorder ([place]) 05/18/2015  . Chronic pain 05/18/2015  . CC (Crohn's colitis) ([place]) 05/18/2015  . Clinical depression 05/18/2015  . Failure of erection 05/18/2015    . 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 Personal history of urinary disorder 05/18/2015  . H/O malignant neoplasm 05/18/2015  . H/O: substance abuse 05/18/2015  . Personal history of arthritis 05/18/2015  . BP (high blood pressure) 05/18/2015  . 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 Colitis gravis ([place]) 05/18/2015  . B12 deficiency 05/18/2015  . 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 APPENDECTOMY  1990   TAKEN OUT WITH COLECTOMY  . APPLICATION OF WOUND VAC N/A 07/01/2015   Procedure: APPLICATION OF WOUND VAC;  Surgeon: Robert Bellow, MD;  Location: [facility] ORS;  Service: General;  Laterality: N/A;  . COLECTOMY  1990  . HAND SURGERY  2011  . ILEOSTOMY  1990  . MINOR APPLICATION OF WOUND VAC N/A 06/29/2015   Procedure: MINOR APPLICATION OF WOUND VAC;  Surgeon: Robert Bellow, MD;  Location: [facility] ORS;  Service: General;  Laterality: N/A;  . MINOR APPLICATION OF WOUND VAC N/A 07/01/2015   Procedure: WOUND VAC CHANGE;  Surgeon: Robert Bellow, MD;  Location: [facility] ORS;  Service: General;  Laterality: N/A;  . OTHER SURGICAL HISTORY     MULTIPLE ABDOMINAL SURGERIES  . SHOULDER SURGERY  2001  . TOTAL COLECTOMY  1990  . UMBILICAL HERNIA REPAIR  2001  . VENTRAL HERNIA REPAIR N/A 06/13/2015   Procedure: HERNIA REPAIR VENTRAL ADULT;  Surgeon: Robert Bellow, MD;  Location: [facility] ORS;  Service: General;  Laterality: N/A;  . WOUND DEBRIDEMENT N/A 06/29/2015   Procedure: DEBRIDEMENT ABDOMINAL WOUND;  Surgeon: Robert Bellow, MD;  Location: [facility] ORS;  Service: General;  Laterality: N/A;  . WOUND DEBRIDEMENT N/A 07/05/2015   Procedure: Delayed primary closure of abdominal wound ;  Surgeon: Robert Bellow, MD;  Location: [facility] ORS;  Service: General;  Laterality: N/A;    Prior to Admission  medications   Medication Sig Start Date End Date Taking? Authorizing Provider  amoxicillin (AMOXIL) 500 MG capsule Take 2 capsules (1,000 mg total) by mouth 2 (two) times daily. 12/22/15   Richard Maceo Pro., MD  feeding supplement (BOOST / RESOURCE BREEZE) LIQD Take 1 Container by mouth daily. Reported on 12/22/2015    Historical Provider, MD  ibuprofen (ADVIL,MOTRIN) 200 MG tablet Take 200 mg by mouth every 4 (four) hours as needed for mild pain or moderate pain.    Historical Provider, MD  oxyCODONE (OXY IR/ROXICODONE) 5 MG immediate release tablet  11/20/15   Historical Provider, MD  potassium chloride 20 MEQ TBCR Take 10 mEq by mouth 2 (two) times daily. Patient not taking: Reported on 12/22/2015 05/12/15   Dannielle Karvonen Menshew, PA-C  QUEtiapine (SEROQUEL XR) 400 MG 24 hr tablet Take 1 tablet (400 mg total) by mouth at bedtime. 02/24/16   Carmon Ginsberg, PA    Allergies Cefaclor and Ciprofloxacin  Family History  Problem Relation Age of Onset  . Hyperlipidemia Mother   . Diabetes Father   . Vascular Disease Father   . Alcohol abuse Father   . Depression Father   . Hyperlipidemia Brother   . CVA Maternal Grandfather     Social History Social History  Substance Use Topics  . Smoking status: Former Smoker    Packs/day: 0.50    Years: 16.00    Types: Cigarettes  . Smokeless tobacco: Never Used     Comment: has been quit "couple of months"  . Alcohol use 0.0 oz/week     Comment: OCC    Review of Systems Constitutional: No fever/chills Eyes: No visual changes. ENT: No sore throat. Cardiovascular: Denies chest pain. Respiratory: Denies shortness of breath. Gastrointestinal: No abdominal pain.   No diarrhea.  No constipation. Genitourinary: Negative for dysuria. Musculoskeletal: Negative for back pain. Skin: Negative for rash. Neurological: Negative for headaches, focal weakness or numbness.  10-point ROS otherwise  negative.  ____________________________________________   PHYSICAL EXAM:  VITAL SIGNS: ED Triage Vitals [05/24/16 1001]  Enc Vitals Group     BP (!) 146/98     Pulse Rate 93     Resp 20     Temp 97.8 F (36.6 C)     Temp Source Oral     SpO2 100 %     Weight 217 lb (98.4 kg)     Height 5\' 11"  (1.803 m)     Head Circumference      Peak Flow      Pain Score 0     Pain Loc      Pain Edu?      Excl. in [place]?     Constitutional: Alert and oriented. Well appearing and in no acute distress. Eyes: Conjunctivae are normal. PERRL. EOMI. Head: Atraumatic. Nose: No congestion/rhinnorhea. Mouth/Throat: Mucous membranes are moist.   Neck: No stridor.   Cardiovascular: Normal rate, regular rhythm. Grossly normal heart sounds.   Respiratory: Normal respiratory effort.  No retractions. Lungs CTAB. Gastrointestinal: Soft and nontender. No distention.  Right lower quadrant ileostomy without any tenderness. No surrounding erythema or induration or pus. Musculoskeletal: No lower extremity tenderness nor edema.  No joint effusions. Neurologic:  Normal speech and language. No gross focal neurologic deficits are appreciated. Skin:  Skin is warm, dry and intact. No rash noted. Psychiatric: Mood and affect are normal. Speech and behavior are normal.  ____________________________________________   LABS (all labs ordered are listed, but only abnormal results are displayed)  Labs Reviewed - No data to display ____________________________________________  EKG   ____________________________________________  RADIOLOGY   ____________________________________________   PROCEDURES  Procedure(s) performed:   Procedures  Critical Care performed:   ____________________________________________   INITIAL IMPRESSION / ASSESSMENT AND PLAN / ED COURSE  Pertinent labs & imaging results that were available during my care of the patient were reviewed by me and considered in my medical decision  making (see chart for details).  Patient says he was able to tolerate Gatorade prior to arrival without any vomiting. I will be refilling his Seroquel prescription. He says he takes a regular Seroquel 400 mg every night.  Clinical Course     ____________________________________________   FINAL CLINICAL IMPRESSION(S) / ED DIAGNOSES  Medication withdrawal. Nausea.    NEW MEDICATIONS STARTED DURING THIS VISIT:  New Prescriptions   No medications on file     Note:  This document was prepared using Dragon voice recognition software and may include unintentional dictation errors.    Orbie Pyo, MD 05/24/16 [phone]

## 2016-05-24 NOTE — ED Triage Notes (Addendum)
Pt to ed with c/o needing refill on seroquel.  Pt states he lost his rx and doesn't have another appt until 9/7.  Pt states he has not been able to sleep and has been having n/v.  Pt denies SI, denies HI, does report feeling anxious and mildly depressed.

## 2016-06-26 ENCOUNTER — Ambulatory Visit (INDEPENDENT_AMBULATORY_CARE_PROVIDER_SITE_OTHER): Payer: Medicaid Other | Admitting: Family Medicine

## 2016-06-26 ENCOUNTER — Ambulatory Visit
Admission: RE | Admit: 2016-06-26 | Discharge: 2016-06-26 | Disposition: A | Discharge status: Home / Self Care | Payer: Medicaid Other | Source: Ambulatory Visit | Attending: Family Medicine | Admitting: Family Medicine

## 2016-06-26 VITALS — BP 128/102 | HR 100 | Temp 98.2°F | Resp 16 | Wt 217.0 lb

## 2016-06-26 DIAGNOSIS — K50119 Crohn's disease of large intestine with unspecified complications: Secondary | ICD-10-CM | POA: Diagnosis not present

## 2016-06-26 DIAGNOSIS — Z9049 Acquired absence of other specified parts of digestive tract: Secondary | ICD-10-CM | POA: Diagnosis not present

## 2016-06-26 DIAGNOSIS — R112 Nausea with vomiting, unspecified: Secondary | ICD-10-CM | POA: Diagnosis not present

## 2016-06-26 DIAGNOSIS — I10 Essential (primary) hypertension: Secondary | ICD-10-CM | POA: Diagnosis not present

## 2016-06-26 DIAGNOSIS — K3184 Gastroparesis: Secondary | ICD-10-CM

## 2016-06-26 DIAGNOSIS — R140 Abdominal distension (gaseous): Secondary | ICD-10-CM | POA: Diagnosis not present

## 2016-06-26 DIAGNOSIS — F3130 Bipolar disorder, current episode depressed, mild or moderate severity, unspecified: Secondary | ICD-10-CM | POA: Diagnosis not present

## 2016-06-26 MED ORDER — METOCLOPRAMIDE HCL 10 MG PO TABS: 10.0000 mg | ORAL_TABLET | Freq: Three times a day (TID) | ORAL | 5 refills | Status: DC

## 2016-06-26 MED ORDER — LISINOPRIL 5 MG PO TABS: 5.0000 mg | ORAL_TABLET | Freq: Every day | ORAL | 3 refills | Status: DC

## 2016-06-26 NOTE — Progress Notes (Signed)
Joshua Mays  MRN: [id number] DOB: 1965/11/14  Subjective:  HPI  Patient has noticed his b/p running high at psychiatrist office around 140/105. Some shortness of breath non exertional. He has taking b/p medication before about 3 years per patient and medication made him feel dizzy and b/p was low. He has also noticed nausea for the past 2 month. He has had to vomit 5 times in the past 2 months due to nausea. Does not feel like food is digesting well and fast as use to. He has spells of diarrhea also. He has had osteotomy. He has taking OTC anti heartburn medications in the past BP Readings from Last 3 Encounters:  06/26/16 (!) 128/102  05/24/16 (!) 137/106  12/22/15 108/72   Patient Active Problem List   Diagnosis Date Noted  . Peristomal hernia 12/09/2015  . Postoperative wound infection 07/16/2015  . Bipolar I disorder, most recent episode depressed ([place]) 07/01/2015  . Borderline personality disorder 07/01/2015  . Alcohol abuse 07/01/2015  . Wound infection after surgery 06/25/2015  . Post-operative wound abscess 06/23/2015  . Ventral hernia 06/13/2015  . 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 Hernia, umbilical [id number]  . Affective bipolar disorder ([place]) 05/18/2015  . Chronic pain 05/18/2015  . CC (Crohn's colitis) ([place]) 05/18/2015  . Clinical depression 05/18/2015  . Failure of erection 05/18/2015  . 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 Personal history of urinary disorder 05/18/2015  . H/O malignant neoplasm 05/18/2015  . H/O: substance abuse 05/18/2015  . Personal history of arthritis 05/18/2015  . BP (high blood pressure) 05/18/2015  . 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 Colitis gravis ([place]) 05/18/2015  . B12 deficiency 05/18/2015  . 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 Bipolar 1 disorder ([place])   . Depression   . Dysrhythmia    "FLUTTERING"  . GERD (gastroesophageal reflux disease)    NO MEDS-WELL-CONTROLLED  . Gout   . History of substance abuse   . Hypertension 2011   OFF MEDS SINCE 2014-EATING BETTER AND LOST WEIGHT-BP BETTER PER PT  . 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 Ulcerative colitis ([place]) 1990    Social History   Social History  . Marital status: Single    Spouse name: N/A  . Number of children: N/A  . Years of education: N/A   Occupational History  . Not on file.   Social History Main Topics  . Smoking status: Former Smoker    Packs/day: 0.50    Years: 16.00    Types: Cigarettes  . Smokeless tobacco: Never Used     Comment: has been quit "couple of months"  . Alcohol use 0.0 oz/week     Comment: OCC  . Drug use: No  . Sexual activity: Not on file   Other Topics Concern  . Not on file   Social History Narrative  . No narrative on file    Outpatient Encounter Prescriptions as of 06/26/2016  Medication Sig  . feeding supplement (BOOST / RESOURCE BREEZE) LIQD Take 1 Container by mouth daily. Reported on 12/22/2015  . ibuprofen (ADVIL,MOTRIN) 200 MG tablet Take 200 mg by mouth every 4 (four) hours as needed for mild pain or moderate pain.  Marland Kitchen oxyCODONE (OXY IR/ROXICODONE) 5 MG immediate release tablet   . QUEtiapine (  SEROQUEL) 400 MG tablet Take 1 tablet (400 mg total) by mouth at bedtime. (Patient taking differently: Take 200 mg by mouth at bedtime. )  . [DISCONTINUED] amoxicillin (AMOXIL) 500 MG capsule Take 2 capsules (1,000 mg total) by mouth 2 (two) times daily.  . [DISCONTINUED] potassium chloride 20 MEQ TBCR Take 10 mEq by mouth 2 (two) times daily. (Patient not taking: Reported on 12/22/2015)   No facility-administered encounter medications on file as of 06/26/2016.     Allergies  Allergen Reactions  . Cefaclor Rash  . Ciprofloxacin Rash    Review of Systems  Constitutional:  Positive for malaise/fatigue.  Eyes: Negative.   Respiratory: Positive for shortness of breath.   Cardiovascular: Negative.   Gastrointestinal: Positive for diarrhea, nausea and vomiting.  Musculoskeletal: Negative.   Skin: Negative.   Neurological: Negative.   Endo/Heme/Allergies: Negative.   Psychiatric/Behavioral: Negative.    Objective:  BP (!) 128/102   Pulse 100   Temp 98.2 F (36.8 C)   Resp 16   Wt 217 lb (98.4 kg)   BMI 30.27 kg/m   Physical Exam  Constitutional: He is oriented to person, place, and time and well-developed, well-nourished, and in no distress.  HENT:  Head: Normocephalic and atraumatic.  Right Ear: External ear normal.  Left Ear: External ear normal.  Nose: Nose normal.  Mouth/Throat: Oropharynx is clear and moist.  Eyes: Conjunctivae are normal.  Cardiovascular: Normal rate, regular rhythm, normal heart sounds and intact distal pulses.   Pulmonary/Chest: Effort normal and breath sounds normal.  Abdominal: Soft. Bowel sounds are normal.  Colostomy bag is in the right side of the abdomenal wall.  Neurological: He is alert and oriented to person, place, and time. No cranial nerve deficit. He exhibits normal muscle tone. Gait normal. Coordination normal.  Skin: Skin is warm and dry.  Psychiatric: Mood, memory, affect and judgment normal.    Assessment and Plan :  1. Crohn's disease of colon with complication [facility]) This is what is present but patient states that he had ulcerative colitis. If he had ulcerative colitis the colostomy will be cured. We'll check records to make sure of diagnosis.  2. Bipolar I disorder, most recent episode depressed [facility]) Sees psychiatrist  3. Nausea and vomiting, intractability of vomiting not specified, unspecified vomiting type Will get imaging, labs. Pending results. Re check in 1 week.Chronicity of this problem is concerning. He needs to get plugged back in with GI. We'll obtain imaging mentioned below to try to  make sure that there is no obstruction from previous surgery. - US Abdomen Complete; Future - DG Abd 1 View; Future - CBC w/Diff/Platelet - Comprehensive metabolic panel  4. Gastroparesis Try Reglan. - metoCLOPramide (REGLAN) 10 MG tablet; Take 1 tablet (10 mg total) by mouth 4 (four) times daily -  before meals and at bedtime.  Dispense: 120 tablet; Refill: 5  5. Essential hypertension Elevated. Re start Lisinopril. Re check in 1 week. - lisinopril (PRINIVIL,ZESTRIL) 5 MG tablet; Take 1 tablet (5 mg total) by mouth daily.  Dispense: 90 tablet; Refill: 3  HPI, Exam and A&P transcribed under direction and in the presence of Miguel Aschoff, MD. I have done the exam and reviewed the chart and it is accurate to the best of my knowledge. Miguel Aschoff M.D. [facility]

## 2016-06-27 LAB — CBC WITH DIFFERENTIAL/PLATELET
BASOS: 0 %
Basophils Absolute: 0 10*3/uL (ref 0.0–0.2)
EOS (ABSOLUTE): 0.1 10*3/uL (ref 0.0–0.4)
EOS: 1 %
HEMATOCRIT: 46 % (ref 37.5–51.0)
HEMOGLOBIN: 16.3 g/dL (ref 12.6–17.7)
Immature Grans (Abs): 0.1 10*3/uL (ref 0.0–0.1)
Immature Granulocytes: 1 %
LYMPHS ABS: 1.5 10*3/uL (ref 0.7–3.1)
Lymphs: 13 %
MCH: 30.9 pg (ref 26.6–33.0)
MCHC: 35.4 g/dL (ref 31.5–35.7)
MCV: 87 fL (ref 79–97)
MONOCYTES: 7 %
Monocytes Absolute: 0.8 10*3/uL (ref 0.1–0.9)
Neutrophils Absolute: 8.6 10*3/uL — ABNORMAL HIGH (ref 1.4–7.0)
Neutrophils: 78 %
Platelets: 378 10*3/uL (ref 150–379)
RBC: 5.27 x10E6/uL (ref 4.14–5.80)
RDW: 14 % (ref 12.3–15.4)
WBC: 11.2 10*3/uL — AB (ref 3.4–10.8)

## 2016-06-27 LAB — COMPREHENSIVE METABOLIC PANEL
ALBUMIN: 4.5 g/dL (ref 3.5–5.5)
ALT: 52 IU/L — ABNORMAL HIGH (ref 0–44)
AST: 36 IU/L (ref 0–40)
Albumin/Globulin Ratio: 1.3 (ref 1.2–2.2)
Alkaline Phosphatase: 137 IU/L — ABNORMAL HIGH (ref 39–117)
BUN / CREAT RATIO: 8 — AB (ref 9–20)
BUN: 9 mg/dL (ref 6–24)
Bilirubin Total: 0.7 mg/dL (ref 0.0–1.2)
CALCIUM: 10.2 mg/dL (ref 8.7–10.2)
CO2: 21 mmol/L (ref 18–29)
CREATININE: 1.18 mg/dL (ref 0.76–1.27)
Chloride: 100 mmol/L (ref 96–106)
GFR calc Af Amer: 83 mL/min/{1.73_m2} (ref >59)
GFR, EST NON AFRICAN AMERICAN: 72 mL/min/{1.73_m2} (ref >59)
GLOBULIN, TOTAL: 3.5 g/dL (ref 1.5–4.5)
Glucose: 90 mg/dL (ref 65–99)
Potassium: 4.8 mmol/L (ref 3.5–5.2)
SODIUM: 138 mmol/L (ref 134–144)
Total Protein: 8 g/dL (ref 6.0–8.5)

## 2016-06-29 ENCOUNTER — Ambulatory Visit: Admission: RE | Admit: 2016-06-29 | Payer: Medicaid Other | Source: Ambulatory Visit

## 2016-07-03 ENCOUNTER — Ambulatory Visit: Payer: Self-pay | Admitting: Family Medicine

## 2016-09-27 IMAGING — DX DG CHEST 1V PORT
1 series · 1 of 1 positions shown · non-contrast
Comparison: 03/11/2013

CLINICAL DATA: Cough, flu-like symptoms.

EXAM:
PORTABLE CHEST 1 VIEW

[chest ap]
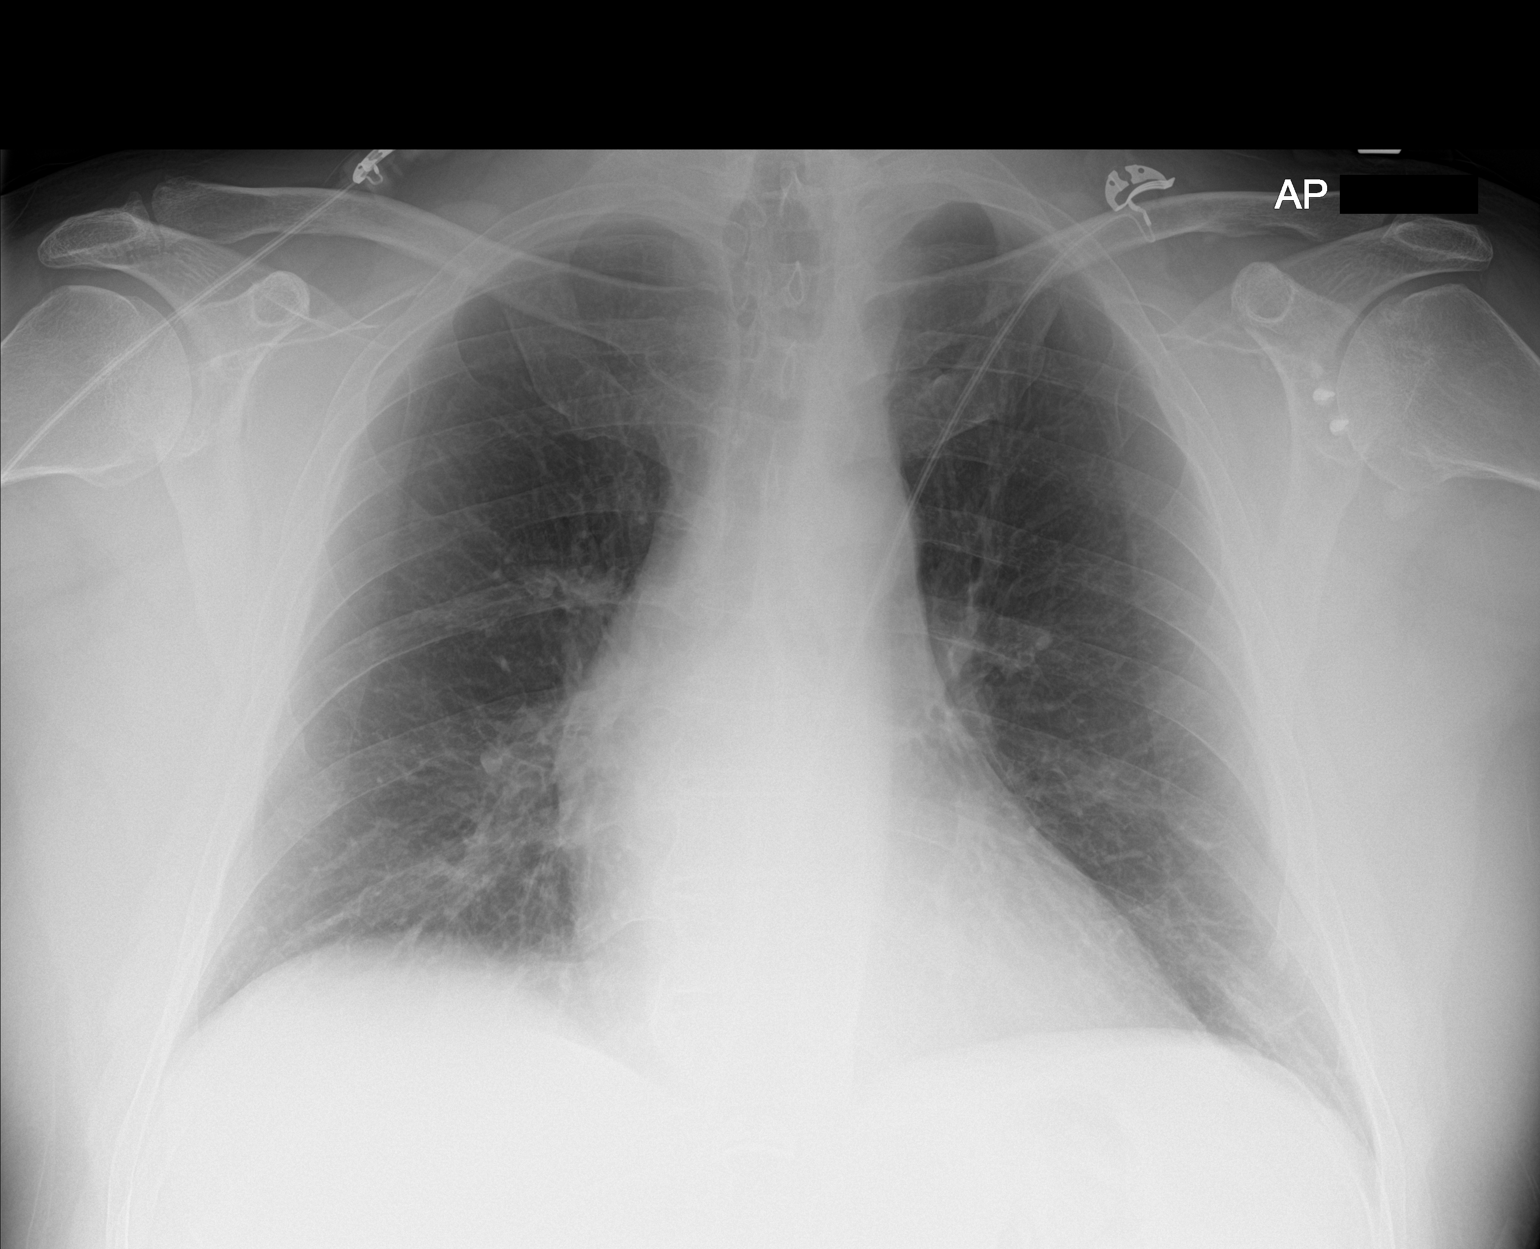

[1 of 1 positions shown; findings below may reference images not displayed]

FINDINGS: Heart and mediastinal contours are within normal limits. No focal
opacities or effusions. No acute bony abnormality.
IMPRESSION: No active disease.

## 2016-10-09 ENCOUNTER — Ambulatory Visit: Payer: Self-pay | Admitting: Family Medicine

## 2016-10-12 ENCOUNTER — Ambulatory Visit: Payer: Self-pay | Admitting: Family Medicine

## 2016-10-16 ENCOUNTER — Ambulatory Visit: Payer: Self-pay | Admitting: Family Medicine

## 2016-10-19 ENCOUNTER — Encounter: Payer: Self-pay | Admitting: Family Medicine

## 2016-10-19 ENCOUNTER — Ambulatory Visit (INDEPENDENT_AMBULATORY_CARE_PROVIDER_SITE_OTHER): Payer: Medicaid Other | Admitting: Family Medicine

## 2016-10-19 VITALS — BP 142/88 | HR 110 | Temp 98.4°F | Resp 16 | Wt 223.0 lb

## 2016-10-19 DIAGNOSIS — N529 Male erectile dysfunction, unspecified: Secondary | ICD-10-CM

## 2016-10-19 DIAGNOSIS — M26629 Arthralgia of temporomandibular joint, unspecified side: Secondary | ICD-10-CM | POA: Diagnosis not present

## 2016-10-19 DIAGNOSIS — Z23 Encounter for immunization: Secondary | ICD-10-CM | POA: Diagnosis not present

## 2016-10-19 MED ORDER — SILDENAFIL CITRATE 20 MG PO TABS: ORAL_TABLET | ORAL | 1 refills | Status: DC

## 2016-10-19 NOTE — Patient Instructions (Signed)
Erectile Dysfunction Erectile dysfunction is the inability to get or sustain a good enough erection to have sexual intercourse. Erectile dysfunction may involve:  Inability to get an erection.  Lack of enough hardness to allow penetration.  Loss of the erection before sex is finished.  Premature ejaculation. CAUSES  Certain drugs, such as:  Pain relievers.  Antihistamines.  Antidepressants.  Blood pressure medicines.  Water pills (diuretics).  Ulcer medicines.  Muscle relaxants.  Illegal drugs.  Excessive drinking.  Psychological causes, such as:  Anxiety.  Depression.  Sadness.  Exhaustion.  Performance fear.  Stress.  Physical causes, such as:  Artery problems. This may include diabetes, smoking, liver disease, or atherosclerosis.  High blood pressure.  Hormonal problems, such as low testosterone.  Obesity.  Nerve problems. This may include back or pelvic injuries, diabetes mellitus, multiple sclerosis, or Parkinson disease. SYMPTOMS  Inability to get an erection.  Lack of enough hardness to allow penetration.  Loss of the erection before sex is finished.  Premature ejaculation.  Normal erections at some times, but with frequent unsatisfactory episodes.  Orgasms that are not satisfactory in sensation or frequency.  Low sexual satisfaction in either partner because of erection problems.  A curved penis occurring with erection. The curve may cause pain or may be too curved to allow for intercourse.  Never having nighttime erections. DIAGNOSIS Your caregiver can often diagnose this condition by:  Performing a physical exam to find other diseases or specific problems with the penis.  Asking you detailed questions about the problem.  Performing blood tests to check for diabetes mellitus or to measure hormone levels.  Performing urine tests to find other underlying health conditions.  Performing an ultrasound exam to check for  scarring.  Performing a test to check blood flow to the penis.  Doing a sleep study at home to measure nighttime erections. TREATMENT   You may be prescribed medicines by mouth.  You may be given medicine injections into the penis.  You may be prescribed a vacuum pump with a ring.  Penile implant surgery may be performed. You may receive:  An inflatable implant.  A semirigid implant.  Blood vessel surgery may be performed. HOME CARE INSTRUCTIONS  If you are prescribed oral medicine, you should take the medicine as prescribed. Do not increase the dosage without first discussing it with your physician.  If you are using self-injections, be careful to avoid any veins that are on the surface of the penis. Apply pressure to the injection site for 5 minutes.  If you are using a vacuum pump, make sure you have read the instructions before using it. Discuss any questions with your physician before taking the pump home. SEEK MEDICAL CARE IF:  You experience pain that is not responsive to the pain medicine you have been prescribed.  You experience nausea or vomiting. SEEK IMMEDIATE MEDICAL CARE IF:   When taking oral or injectable medications, you experience an erection that lasts longer than 4 hours. If your physician is unavailable, go to the nearest emergency room for evaluation. An erection that lasts much longer than 4 hours can result in permanent damage to your penis.  You have pain that is severe.  You develop redness, severe pain, or severe swelling of your penis.  You have redness spreading up into your groin or lower abdomen.  You are unable to pass your urine. This information is not intended to replace advice given to you by your health care provider. Make sure you   discuss any questions you have with your health care provider. Document Released: 09/07/2000 Document Revised: 05/13/2013 Document Reviewed: 02/12/2013 Elsevier Interactive Patient Education  2017 [employer] Temporomandibular Joint Syndrome Temporomandibular joint (TMJ) syndrome is a condition that affects the joints between your jaw and your skull. The TMJs are located near your ears and allow your jaw to open and close. These joints and the nearby muscles are involved in all movements of the jaw. People with TMJ syndrome have pain in the area of these joints and muscles. Chewing, biting, or other movements of the jaw can be difficult or painful. TMJ syndrome can be caused by various things. In many cases, the condition is mild and goes away within a few weeks. For some people, the condition can become a long-term problem. What are the causes? Possible causes of TMJ syndrome include:  Grinding your teeth or clenching your jaw. Some people do this when they are under stress.  Arthritis.  Injury to the jaw.  Head or neck injury.  Teeth or dentures that are not aligned well. In some cases, the cause of TMJ syndrome may not be known. What are the signs or symptoms? The most common symptom is an aching pain on the side of the head in the area of the TMJ. Other symptoms may include:  Pain when moving your jaw, such as when chewing or biting.  Being unable to open your jaw all the way.  Making a clicking sound when you open your mouth.  Headache.  Earache.  Neck or shoulder pain. How is this diagnosed? Diagnosis can usually be made based on your symptoms, your medical history, and a physical exam. Your health care provider may check the range of motion of your jaw. Imaging tests, such as X-rays or an MRI, are sometimes done. You may need to see your dentist to determine if your teeth and jaw are lined up correctly. How is this treated? TMJ syndrome often goes away on its own. If treatment is needed, the options may include:  Eating soft foods and applying ice or heat.  Medicines to relieve pain or inflammation.  Medicines to relax the muscles.  A splint, bite plate, or mouthpiece  to prevent teeth grinding or jaw clenching.  Relaxation techniques or counseling to help reduce stress.  Transcutaneous electrical nerve stimulation (TENS). This helps to relieve pain by applying an electrical current through the skin.  Acupuncture. This is sometimes helpful to relieve pain.  Jaw surgery. This is rarely needed. Follow these instructions at home:  Take medicines only as directed by your health care provider.  Eat a soft diet if you are having trouble chewing.  Apply ice to the painful area.  Put ice in a plastic bag.  Place a towel between your skin and the bag.  Leave the ice on for 20 minutes, 2-3 times a day.  Apply a warm compress to the painful area as directed.  Massage your jaw area and perform any jaw stretching exercises as recommended by your health care provider.  If you were given a mouthpiece or bite plate, wear it as directed.  Avoid foods that require a lot of chewing. Do not chew gum.  Keep all follow-up visits as directed by your health care provider. This is important. Contact a health care provider if:  You are having trouble eating.  You have new or worsening symptoms. Get help right away if:  Your jaw locks open or closed. This information is not intended  to replace advice given to you by your health care provider. Make sure you discuss any questions you have with your health care provider. Document Released: 06/05/2001 Document Revised: 05/10/2016 Document Reviewed: 04/15/2014 Elsevier Interactive Patient Education  2017 [employer].

## 2016-10-19 NOTE — Progress Notes (Signed)
Patient: Joshua Mays Male    DOB: 1965-11-20   51 y.o.   MRN: [id number] Visit Date: 10/19/2016  Today's Provider: Vernie Murders, PA   Chief Complaint  Patient presents with  . Jaw Pain   Subjective:    HPI Patient comes in today c/o jaw pain on his left side. He reports that it has progressively gotten worse over the last month. Patient reports that he has difficulty eating due to the jaw pain. He reports that his jaw has a "popping" sensation when he opens and closes his mouth.   Past Medical History:  Diagnosis Date  . Bipolar 1 disorder ([place])   . Depression   . Dysrhythmia    "FLUTTERING"  . GERD (gastroesophageal reflux disease)    NO MEDS-WELL-CONTROLLED  . Gout   . History of substance abuse   . Hypertension 2011   OFF MEDS SINCE 2014-EATING BETTER AND LOST WEIGHT-BP BETTER PER PT  . 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 Ulcerative colitis ([place]) 1990   Past Surgical History:  Procedure Laterality Date  . APPENDECTOMY  1990   TAKEN OUT WITH COLECTOMY  . APPLICATION OF WOUND VAC N/A 07/01/2015   Procedure: APPLICATION OF WOUND VAC;  Surgeon: Robert Bellow, MD;  Location: [facility] OR[facility];  Service: General;  Laterality: N/A;  . COLECTOMY  1990  . HAND SURGERY  2011  . ILEOSTOMY  1990  . MINOR APPLICATION OF WOUND VAC N/A 06/29/2015   Procedure: MINOR APPLICATION OF WOUND VAC;  Surgeon: Robert Bellow, MD;  Location: [facility];  Service: General;  Laterality: N/A;  . MINOR APPLICATION OF WOUND VAC N/A 07/01/2015   Procedure: WOUND VAC CHANGE;  Surgeon: Robert Bellow, MD;  Location: [facility];  Service: General;  Laterality: N/A;  . OTHER SURGICAL HISTORY     MULTIPLE ABDOMINAL SURGERIES  . SHOULDER SURGERY  2001  . TOTAL COLECTOMY  1990  . UMBILICAL HERNIA REPAIR  2001  . VENTRAL HERNIA REPAIR N/A 06/13/2015   Procedure: HERNIA REPAIR VENTRAL ADULT;  Surgeon: Robert Bellow, MD;  Location: [facility];  Service: General;   Laterality: N/A;  . WOUND DEBRIDEMENT N/A 06/29/2015   Procedure: DEBRIDEMENT ABDOMINAL WOUND;  Surgeon: Robert Bellow, MD;  Location: [facility];  Service: General;  Laterality: N/A;  . WOUND DEBRIDEMENT N/A 07/05/2015   Procedure: Delayed primary closure of abdominal wound ;  Surgeon: Robert Bellow, MD;  Location: [facility];  Service: General;  Laterality: N/A;   Family History  Problem Relation Age of Onset  . Hyperlipidemia Mother   . Diabetes Father   . Vascular Disease Father   . Alcohol abuse Father   . Depression Father   . Hyperlipidemia Brother   . CVA Maternal Grandfather        Allergies  Allergen Reactions  . Cefaclor Rash  . Ciprofloxacin Rash     Current Outpatient Prescriptions:  .  feeding supplement (BOOST / RESOURCE BREEZE) LIQD, Take 1 Container by mouth daily. Reported on 12/22/2015, Disp: , Rfl:  .  ibuprofen (ADVIL,MOTRIN) 200 MG tablet, Take 200 mg by mouth every 4 (four) hours as needed for mild pain or moderate pain., Disp: , Rfl:  .  lisinopril (PRINIVIL,ZESTRIL) 5 MG tablet, Take 1 tablet (5 mg total) by mouth daily., Disp: 90 tablet, Rfl: 3 .  metoCLOPramide (REGLAN) 10 MG tablet, Take 1 tablet (10 mg total) by mouth  4 (four) times daily -  before meals and at bedtime., Disp: 120 tablet, Rfl: 5 .  oxyCODONE (OXY IR/ROXICODONE) 5 MG immediate release tablet, , Disp: , Rfl:  .  QUEtiapine (SEROQUEL) 400 MG tablet, Take 1 tablet (400 mg total) by mouth at bedtime. (Patient taking differently: Take 200 mg by mouth at bedtime. ), Disp: 14 tablet, Rfl: 0  Review of Systems  Constitutional: Negative.   HENT: Positive for ear pain.        Left ear pain  Musculoskeletal: Positive for arthralgias and myalgias.    Social History  Substance Use Topics  . Smoking status: Former Smoker    Packs/day: 0.50    Years: 16.00    Types: Cigarettes  . Smokeless tobacco: Never Used     Comment: has been quit "couple of months"  . Alcohol use 0.0 oz/week      Comment: OCC   Objective:   BP (!) 142/88 (BP Location: Right Arm, Patient Position: Sitting, Cuff Size: Large)   Pulse (!) 110   Temp 98.4 F (36.9 C)   Resp 16   Wt 223 lb (101.2 kg)   SpO2 96%   BMI 31.10 kg/m   Physical Exam  Constitutional: He is oriented to person, place, and time. He appears well-developed and well-nourished. No distress.  HENT:  Head: Normocephalic and atraumatic.  Right Ear: Hearing and external ear normal.  Left Ear: Hearing and external ear normal.  Nose: Nose normal.  Tender left TMJ with crepitus and occasional locking.  Eyes: Conjunctivae and lids are normal. Right eye exhibits no discharge. Left eye exhibits no discharge. No scleral icterus.  Cardiovascular: Normal rate and regular rhythm.   Pulmonary/Chest: Effort normal. No respiratory distress.  Musculoskeletal: Normal range of motion.  Neurological: He is alert and oriented to person, place, and time.  Skin: Skin is intact. No lesion and no rash noted.  Psychiatric: He has a normal mood and affect. His speech is normal and behavior is normal. Thought content normal.      Assessment & Plan:     1. TMJ syndrome Onset of left jaw pain with eating and radiation to the left ear and neck over the past month. Has crepitus and a "pop" with pain when closing mouth after opening wide. Admits to some history of grinding teeth at night. Recommend trying NSAID, soft diet with increased fluids and using a mouth appliance to prevent grinding at night. Recheck in a month.  2. Erectile dysfunction, unspecified erectile dysfunction type Before leaving the examination room, patient quietly discussed inability to maintain erections throughout intercourse. Has had some success with use of Viagra in the past but could not continue use due to cost. Will give prescription for generic Sildenafil to check at several local pharmacies for better prices since his Medicaid does not cover this medication. - sildenafil  (REVATIO) 20 MG tablet; Take 1-2 tablets 1-4 hours prior to intercourse.  Dispense: 30 tablet; Refill: 1  3. Need for influenza vaccination - Flu Vaccine QUAD 36+ mos PF IM (Fluarix & Fluzone Quad PF)       Vernie Murders, PA  [facility]

## 2016-11-16 ENCOUNTER — Ambulatory Visit (INDEPENDENT_AMBULATORY_CARE_PROVIDER_SITE_OTHER): Payer: Medicaid Other | Admitting: Family Medicine

## 2016-11-16 ENCOUNTER — Encounter: Payer: Self-pay | Admitting: Family Medicine

## 2016-11-16 VITALS — BP 130/96 | HR 93 | Temp 98.0°F | Resp 16 | Wt 222.8 lb

## 2016-11-16 DIAGNOSIS — K1120 Sialoadenitis, unspecified: Secondary | ICD-10-CM | POA: Diagnosis not present

## 2016-11-16 DIAGNOSIS — M26629 Arthralgia of temporomandibular joint, unspecified side: Secondary | ICD-10-CM

## 2016-11-16 MED ORDER — DOXYCYCLINE HYCLATE 100 MG PO TABS: 100.0000 mg | ORAL_TABLET | Freq: Two times a day (BID) | ORAL | 0 refills | Status: DC

## 2016-11-16 NOTE — Patient Instructions (Signed)
Salivary Gland Infection Introduction A salivary gland infection is an infection in one or more of the glands that produce spit (saliva). You have six major salivary glands. Each gland has a duct that carries saliva into your mouth. Saliva keeps your mouth moist and breaks down the food that you eat. It also helps to prevent tooth decay. Two salivary glands are located just in front of your ears (parotid). The ducts for these glands open up inside your cheeks, near your back teeth. You also have two glands under your tongue (sublingual) and two glands under your jaw (submandibular). The ducts for these glands open under your tongue. Any salivary gland can become infected. Most infections occur in the parotid glands or submandibular glands. What are the causes? Salivary glands can be infected by viruses or bacteria.  The mumps virus is the most common cause of viral salivary gland infections, though mumps is now rare in many areas because of vaccination.  This infection causes swelling in both parotid glands.  Viral infections are more common in children.  The bacteria that cause salivary gland infections are usually the same bacteria that normally live in your mouth.  A stone can form in a salivary gland and block the flow of saliva. As a result, saliva backs up into the salivary gland. Bacteria may then start to grow behind the blockage and cause infection.  Bacterial infections usually cause pain and swelling on one side of the face. Submandibular gland swelling occurs under the jaw. Parotid swelling occurs in front of the ear.  Bacterial infections are more common in adults. What increases the risk? Children who do not get the MMR (measles, mumps, rubella) vaccine are more likely to get mumps, which can cause a viral salivary gland infection. Risk factors for bacterial infections include:  Poor dental care (oral hygiene).  Smoking.  Not drinking enough water.  Having a disease that  causes dry mouth and dry eyes (Mikulicz syndrome or Sjogren syndrome). What are the signs or symptoms? The main sign of salivary gland infection is a swollen salivary gland. This type of inflammation is often called sialadenitis. You may have swelling in front of your ear, under your jaw, or under your tongue. Swelling may get worse when you eat and decrease after you eat. Other signs and symptoms include:  Pain.  Tenderness.  Redness.  Dry mouth.  Bad taste in your mouth.  Difficulty chewing and swallowing.  Fever. How is this diagnosed? Your health care provider may suspect a salivary gland infection based on your signs and symptoms. He or she will also do a physical exam. The health care provider will look and feel inside your mouth to see whether a stone is blocking a salivary gland duct. You may need to see an ear, nose, and throat specialist (ENT or otolaryngologist) for diagnosis and treatment. You may also need to have diagnostic tests, such as:  An X-ray to check for a stone.  Other imaging studies to look for an abscess and to rule out other causes of swelling. These tests may include:  Ultrasound.  CT scan.  MRI.  Culture and sensitivity test. This involves collecting a sample of pus for testing in the lab to see what bacteria grow and what antibiotics they are sensitive to. The testing sample may be:  Swabbed from a salivary gland duct.  Withdrawn from a swollen gland with a needle (aspiration). How is this treated? Viral salivary gland infections usually clear up without treatment. Bacterial  infections are usually treated with antibiotic medicine. Severe infections that cause difficulty with swallowing may be treated with an IV antibiotic in the hospital. Other treatments may include:  Probing and widening the salivary duct to allow a stone to pass. In some cases, a thin, flexible scope (endoscope) may be inserted into the duct to find a stone and remove  it.  Breaking up a stone using sound waves.  Draining an infected gland (abscess) with a needle.  In some cases, you may need surgery so your health care provider can:  Remove a stone.  Drain pus from an abscess.  Remove a badly infected gland. Follow these instructions at home:  Take medicines only as directed by your health care provider.  If you were prescribed an antibiotic medicine, finish it all even if you start to feel better.  Follow these instructions every few hours:  Suck on a lemon candy to stimulate the flow of saliva.  Put a warm compress over the gland.  Gently massage the gland.  Drink enough fluid to keep your urine clear or pale yellow.  Rinse your mouth with a mixture of warm water and salt every few hours. To make this mixture, add a pinch of salt to 1 cup of warm water.  Practice good oral hygiene by brushing and flossing your teeth after meals and before you go to bed.  Do not use any tobacco products, including cigarettes, chewing tobacco, or electronic cigarettes. If you need help quitting, ask your health care provider. Contact a health care provider if:  You have pain and swelling in your face, jaw, or mouth after eating.  You have persistent swelling in any of these places:  In front of your ear.  Under your jaw.  Inside your mouth. Get help right away if:  You have pain and swelling in your face, jaw, or mouth that are getting worse.  Your pain and swelling make it hard to swallow or breathe. This information is not intended to replace advice given to you by your health care provider. Make sure you discuss any questions you have with your health care provider. Document Released: 10/18/2004 Document Revised: 02/16/2016 Document Reviewed: 02/10/2014  2017 [employer]

## 2016-11-16 NOTE — Progress Notes (Signed)
Patient: Joshua Mays Male    DOB: Jan 13, 1966   51 y.o.   MRN: [id number] Visit Date: 11/16/2016  Today's Provider: Vernie Murders, PA   Chief Complaint  Patient presents with  . Follow-up   Subjective:    HPI  TMJ syndrome:  Patient is here for a 1 month follow up. Last OV was 10/19/2016. Patient was advised to use NSAIDS, eat a soft diet with increased fluid intake, and use a mouth appliance at night to prevent grinding. Patient reports good compliance with treatment plan. Symptoms are worse.   Patient Active Problem List   Diagnosis Date Noted  . H/O ventral hernia repair 12/13/2015  . History of ileostomy 12/13/2015  . 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 Peristomal hernia 12/09/2015  . Postoperative wound infection 07/16/2015  . Bipolar I disorder, most recent episode depressed ([place]) 07/01/2015  . Borderline personality disorder 07/01/2015  . Alcohol abuse 07/01/2015  . Wound infection after surgery 06/25/2015  . Post-operative wound abscess 06/23/2015  . Ventral hernia 06/13/2015  . 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 Hernia, umbilical [id number]  . Affective bipolar disorder ([place]) 05/18/2015  . Chronic pain 05/18/2015  . CC (Crohn's colitis) ([place]) 05/18/2015  . Clinical depression 05/18/2015  . Failure of erection 05/18/2015  . 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 Personal history of urinary disorder 05/18/2015  . H/O malignant neoplasm 05/18/2015  . H/O: substance abuse 05/18/2015  . Personal history of arthritis 05/18/2015  . BP (high blood pressure) 05/18/2015  . 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 Colitis gravis ([place]) 05/18/2015  . B12 deficiency 05/18/2015  . 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 APPENDECTOMY  1990   TAKEN OUT  WITH COLECTOMY  . APPLICATION OF WOUND VAC N/A 07/01/2015   Procedure: APPLICATION OF WOUND VAC;  Surgeon: Robert Bellow, MD;  Location: [facility] ORS;  Service: General;  Laterality: N/A;  . COLECTOMY  1990  . HAND SURGERY  2011  . ILEOSTOMY  1990  . MINOR APPLICATION OF WOUND VAC N/A 06/29/2015   Procedure: MINOR APPLICATION OF WOUND VAC;  Surgeon: Robert Bellow, MD;  Location: [facility];  Service: General;  Laterality: N/A;  . MINOR APPLICATION OF WOUND VAC N/A 07/01/2015   Procedure: WOUND VAC CHANGE;  Surgeon: Robert Bellow, MD;  Location: [facility];  Service: General;  Laterality: N/A;  . OTHER SURGICAL HISTORY     MULTIPLE ABDOMINAL SURGERIES  . SHOULDER SURGERY  2001  . TOTAL COLECTOMY  1990  . UMBILICAL HERNIA REPAIR  2001  . VENTRAL HERNIA REPAIR N/A 06/13/2015   Procedure: HERNIA REPAIR VENTRAL ADULT;  Surgeon: Robert Bellow, MD;  Location: [facility];  Service: General;  Laterality: N/A;  . WOUND DEBRIDEMENT N/A 06/29/2015   Procedure: DEBRIDEMENT ABDOMINAL WOUND;  Surgeon: Robert Bellow, MD;  Location: [facility];  Service: General;  Laterality: N/A;  . WOUND DEBRIDEMENT N/A 07/05/2015   Procedure: Delayed primary closure of abdominal wound ;  Surgeon: Robert Bellow, MD;  Location: [facility];  Service: General;  Laterality: N/A;   Family History  Problem Relation Age of Onset  . Hyperlipidemia Mother   . Diabetes Father   . Vascular Disease Father   . Alcohol abuse Father   . Depression Father   . Hyperlipidemia Brother   .  CVA Maternal Grandfather    Allergies  Allergen Reactions  . Cefaclor Rash  . Ciprofloxacin Rash     Previous Medications   FEEDING SUPPLEMENT (BOOST / RESOURCE BREEZE) LIQD    Take 1 Container by mouth daily. Reported on 12/22/2015   IBUPROFEN (ADVIL,MOTRIN) 200 MG TABLET    Take 200 mg by mouth every 4 (four) hours as needed for mild pain or moderate pain.   LISINOPRIL (PRINIVIL,ZESTRIL) 5 MG TABLET    Take 1 tablet (5 mg total) by  mouth daily.   METOCLOPRAMIDE (REGLAN) 10 MG TABLET    Take 1 tablet (10 mg total) by mouth 4 (four) times daily -  before meals and at bedtime.   OXYCODONE (OXY IR/ROXICODONE) 5 MG IMMEDIATE RELEASE TABLET       QUETIAPINE (SEROQUEL) 400 MG TABLET    Take 1 tablet (400 mg total) by mouth at bedtime.   SILDENAFIL (REVATIO) 20 MG TABLET    Take 1-2 tablets 1-4 hours prior to intercourse.    Review of Systems  Constitutional: Negative.   Respiratory: Negative.   Cardiovascular: Negative.     Social History  Substance Use Topics  . Smoking status: Current Every Day Smoker    Packs/day: 0.50    Years: 16.00    Types: Cigarettes  . Smokeless tobacco: Never Used     Comment: has been quit "couple of months"  . Alcohol use 0.0 oz/week     Comment: OCC   Objective:   BP (!) 130/96 (BP Location: Right Arm, Patient Position: Sitting, Cuff Size: Normal)   Pulse 93   Temp 98 F (36.7 C) (Oral)   Resp 16   Wt 222 lb 12.8 oz (101.1 kg)   SpO2 97%   BMI 31.07 kg/m   Physical Exam  Constitutional: He is oriented to person, place, and time. He appears well-developed and well-nourished. No distress.  HENT:  Head: Normocephalic and atraumatic.  Right Ear: Hearing and external ear normal.  Left Ear: Hearing and external ear normal.  Nose: Nose normal.  Mouth/Throat: Oropharynx is clear and moist.  Eyes: Conjunctivae and lids are normal. Right eye exhibits no discharge. Left eye exhibits no discharge. No scleral icterus.  Neck: Neck supple.  Swelling left parotid and TMJ. Mild tenderness. Popping of joint.  Pulmonary/Chest: Effort normal. No respiratory distress.  Musculoskeletal: Normal range of motion.  Neurological: He is alert and oriented to person, place, and time.  Skin: Skin is intact. No lesion and no rash noted.  Psychiatric: He has a normal mood and affect. His speech is normal and behavior is normal. Thought content normal.      Assessment & Plan:     1. TMJ  syndrome Very little change from popping and discomfort despite use of NSAID with mouth piece. Continue soft diet with flares and moist heat.  2. Sialoadenitis Swelling and increased salivation with eating sour candy. Present over the past couple weeks. Apply moist heat and treat with antibiotic. Get CBC to assess infection. Recheck prn. - CBC with Differential/Platelet - doxycycline (VIBRA-TABS) 100 MG tablet; Take 1 tablet (100 mg total) by mouth 2 (two) times daily.  Dispense: 20 tablet; Refill: 0

## 2016-11-17 LAB — CBC WITH DIFFERENTIAL/PLATELET
BASOS: 0 %
Basophils Absolute: 0 10*3/uL (ref 0.0–0.2)
EOS (ABSOLUTE): 0.1 10*3/uL (ref 0.0–0.4)
EOS: 1 %
HEMATOCRIT: 48.1 % (ref 37.5–51.0)
Hemoglobin: 16.4 g/dL (ref 13.0–17.7)
Immature Grans (Abs): 0.1 10*3/uL (ref 0.0–0.1)
Immature Granulocytes: 1 %
LYMPHS ABS: 1.2 10*3/uL (ref 0.7–3.1)
Lymphs: 11 %
MCH: 30.5 pg (ref 26.6–33.0)
MCHC: 34.1 g/dL (ref 31.5–35.7)
MCV: 90 fL (ref 79–97)
MONOS ABS: 0.7 10*3/uL (ref 0.1–0.9)
Monocytes: 7 %
Neutrophils Absolute: 8.8 10*3/uL — ABNORMAL HIGH (ref 1.4–7.0)
Neutrophils: 80 %
Platelets: 303 10*3/uL (ref 150–379)
RBC: 5.37 x10E6/uL (ref 4.14–5.80)
RDW: 14.8 % (ref 12.3–15.4)
WBC: 10.9 10*3/uL — AB (ref 3.4–10.8)

## 2016-11-27 ENCOUNTER — Telehealth: Payer: Self-pay | Admitting: Family Medicine

## 2016-11-27 NOTE — Telephone Encounter (Signed)
Pt states he rec'd a Rx for neck pain.  Pt states he has finished the Rx and is still having neck pain.  Pt is asking what is the next step?  [id number]

## 2016-11-27 NOTE — Telephone Encounter (Signed)
If still having swelling under jaw or chin, recommend referral to ENT

## 2016-11-28 NOTE — Telephone Encounter (Signed)
LMTCB

## 2016-12-03 NOTE — Telephone Encounter (Signed)
Patient would like to proceed with ENT referral.

## 2016-12-03 NOTE — Telephone Encounter (Signed)
LMTCB

## 2016-12-04 ENCOUNTER — Other Ambulatory Visit: Payer: Self-pay | Admitting: Family Medicine

## 2016-12-04 DIAGNOSIS — K1120 Sialoadenitis, unspecified: Secondary | ICD-10-CM

## 2016-12-04 NOTE — Telephone Encounter (Signed)
Placed order to schedule ENT referral.

## 2016-12-07 ENCOUNTER — Other Ambulatory Visit: Payer: Self-pay

## 2016-12-07 MED ORDER — QUETIAPINE FUMARATE 400 MG PO TABS: 400.0000 mg | ORAL_TABLET | Freq: Every day | ORAL | 0 refills | Status: DC

## 2016-12-07 NOTE — Telephone Encounter (Signed)
Patient called and states that he sees psychiatrist at [facility] and somehow did not have follow up appointment and he made it for Thursday next week but he will not have enough Seroquel to last him till then. He gets sick if he runs out of this medication. [facility] are not providing it for him to last him till then and wanted to see if we can provide him enough to last till then. He takes Seroquel regular tablets 400 mg. Please review. Thank you-aa

## 2016-12-07 NOTE — Telephone Encounter (Signed)
Pt advised on voicemail-aa 

## 2016-12-26 ENCOUNTER — Other Ambulatory Visit: Payer: Self-pay | Admitting: Family Medicine

## 2016-12-26 DIAGNOSIS — N529 Male erectile dysfunction, unspecified: Secondary | ICD-10-CM

## 2017-01-26 ENCOUNTER — Other Ambulatory Visit: Payer: Self-pay | Admitting: Family Medicine

## 2017-01-26 DIAGNOSIS — N529 Male erectile dysfunction, unspecified: Secondary | ICD-10-CM

## 2017-02-01 ENCOUNTER — Telehealth: Payer: Self-pay | Admitting: Family Medicine

## 2017-02-01 DIAGNOSIS — N529 Male erectile dysfunction, unspecified: Secondary | ICD-10-CM

## 2017-02-01 MED ORDER — SILDENAFIL CITRATE 100 MG PO TABS: ORAL_TABLET | ORAL | 1 refills | Status: DC

## 2017-02-01 NOTE — Telephone Encounter (Signed)
Patient was taking double dose of 20 mg tablets. Discussed with Simona Huh changed RX to 100 mg and sent to [facility] pharmacy.

## 2017-02-01 NOTE — Telephone Encounter (Signed)
Pt statwes per his pharmacy the Rx for sildenafil (REVATIO) 20 MG tablet was denied.  Please advise.  GF#[phone]/[phone]

## 2017-02-01 NOTE — Telephone Encounter (Signed)
How often is he using the Sildenafil? If he has used the 30 tablets with one refill (total of 60 tablets) in the past month (prescription was sent to the pharmacy on 12-27-16), then taking it too often. If using enough tablets to take 100 mg each time, should change to the 100 mg tablets.

## 2017-02-01 NOTE — Telephone Encounter (Signed)
Patient is requesting a refill of Sildenafil be sent to [facility] pharmacy. Patient states he has been taking 2 tablets because he was taking 100 mg tablets before the refill for 20 mg.

## 2017-03-17 ENCOUNTER — Encounter: Payer: Self-pay | Admitting: *Deleted

## 2017-03-17 ENCOUNTER — Emergency Department: Payer: Medicaid Other

## 2017-03-17 ENCOUNTER — Emergency Department
Admission: EM | Admit: 2017-03-17 | Discharge: 2017-03-17 | Disposition: A | Discharge status: Home / Self Care | Payer: Medicaid Other | Attending: Emergency Medicine | Admitting: Emergency Medicine

## 2017-03-17 DIAGNOSIS — Z79899 Other long term (current) drug therapy: Secondary | ICD-10-CM | POA: Diagnosis not present

## 2017-03-17 DIAGNOSIS — Z791 Long term (current) use of non-steroidal anti-inflammatories (NSAID): Secondary | ICD-10-CM | POA: Insufficient documentation

## 2017-03-17 DIAGNOSIS — I10 Essential (primary) hypertension: Secondary | ICD-10-CM | POA: Diagnosis not present

## 2017-03-17 DIAGNOSIS — F319 Bipolar disorder, unspecified: Secondary | ICD-10-CM | POA: Diagnosis not present

## 2017-03-17 DIAGNOSIS — Z85828 Personal history of other malignant neoplasm of skin: Secondary | ICD-10-CM | POA: Diagnosis not present

## 2017-03-17 DIAGNOSIS — F17210 Nicotine dependence, cigarettes, uncomplicated: Secondary | ICD-10-CM | POA: Insufficient documentation

## 2017-03-17 DIAGNOSIS — R220 Localized swelling, mass and lump, head: Secondary | ICD-10-CM | POA: Diagnosis present

## 2017-03-17 DIAGNOSIS — K1120 Sialoadenitis, unspecified: Secondary | ICD-10-CM | POA: Diagnosis not present

## 2017-03-17 LAB — COMPREHENSIVE METABOLIC PANEL
ALBUMIN: 3.6 g/dL (ref 3.5–5.0)
ALK PHOS: 92 U/L (ref 38–126)
ALT: 21 U/L (ref 17–63)
ANION GAP: 7 (ref 5–15)
AST: 26 U/L (ref 15–41)
BILIRUBIN TOTAL: 0.8 mg/dL (ref 0.3–1.2)
BUN: 9 mg/dL (ref 6–20)
CALCIUM: 9.2 mg/dL (ref 8.9–10.3)
CO2: 25 mmol/L (ref 22–32)
CREATININE: 1.16 mg/dL (ref 0.61–1.24)
Chloride: 106 mmol/L (ref 101–111)
GFR calc Af Amer: >60 mL/min (ref >60)
GFR calc non Af Amer: >60 mL/min (ref >60)
Glucose, Bld: 151 mg/dL — ABNORMAL HIGH (ref 65–99)
Potassium: 3.5 mmol/L (ref 3.5–5.1)
Sodium: 138 mmol/L (ref 135–145)
TOTAL PROTEIN: 7.5 g/dL (ref 6.5–8.1)

## 2017-03-17 LAB — CBC WITH DIFFERENTIAL/PLATELET
BASOS PCT: 0 %
Basophils Absolute: 0 10*3/uL (ref 0–0.1)
EOS ABS: 0 10*3/uL (ref 0–0.7)
Eosinophils Relative: 0 %
HEMATOCRIT: 40.7 % (ref 40.0–52.0)
Hemoglobin: 13.8 g/dL (ref 13.0–18.0)
Lymphocytes Relative: 9 %
Lymphs Abs: 1 10*3/uL (ref 1.0–3.6)
MCH: 30.4 pg (ref 26.0–34.0)
MCHC: 33.9 g/dL (ref 32.0–36.0)
MCV: 89.7 fL (ref 80.0–100.0)
MONO ABS: 0.7 10*3/uL (ref 0.2–1.0)
MONOS PCT: 6 %
Neutro Abs: 9.3 10*3/uL — ABNORMAL HIGH (ref 1.4–6.5)
Neutrophils Relative %: 85 %
Platelets: 241 10*3/uL (ref 150–440)
RBC: 4.53 MIL/uL (ref 4.40–5.90)
RDW: 14.4 % (ref 11.5–14.5)
WBC: 11.1 10*3/uL — ABNORMAL HIGH (ref 3.8–10.6)

## 2017-03-17 MED ORDER — IOPAMIDOL (ISOVUE-300) INJECTION 61%
75.0000 mL | Freq: Once | INTRAVENOUS | Status: AC | PRN
  Administered 2017-03-17: 75 mL via INTRAVENOUS
  Filled 2017-03-17: qty 75

## 2017-03-17 MED ORDER — CLINDAMYCIN HCL 300 MG PO CAPS: 300.0000 mg | ORAL_CAPSULE | Freq: Three times a day (TID) | ORAL | 0 refills | Status: AC

## 2017-03-17 MED ORDER — CLINDAMYCIN HCL 150 MG PO CAPS
300.0000 mg | ORAL_CAPSULE | Freq: Once | ORAL | Status: AC
  Administered 2017-03-17: 300 mg via ORAL
  Filled 2017-03-17: qty 2

## 2017-03-17 NOTE — ED Provider Notes (Signed)
[facility] Emergency Department Provider Note   ____________________________________________   I have reviewed the triage vital signs and the nursing notes.   HISTORY  Chief Complaint Sore Throat and Otalgia    HPI Joshua Mays is a 51 y.o. male presents with left side submandibular swelling,  pain that radiates down the neck and below the mastoid process that developed approximately 1 week ago and has worsened over the last 24 hours. Patient has not taken his temperature but he felt like he has had intermittent low-grade fevers. Patient recalls losing a cap on his tooth recently otherwise has no other known dental sources of infection. Patient denies, headache, vision changes, chest pain, chest tightness, shortness of breath, abdominal pain, nausea and vomiting.  Past Medical History:  Diagnosis Date  . Bipolar 1 disorder ([place])   . Depression   . Dysrhythmia    "FLUTTERING"  . GERD (gastroesophageal reflux disease)    NO MEDS-WELL-CONTROLLED  . Gout   . History of substance abuse   . Hypertension 2011   OFF MEDS SINCE 2014-EATING BETTER AND LOST WEIGHT-BP BETTER PER PT  . 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 Ulcerative colitis ([place]) 1990    Patient Active Problem List   Diagnosis Date Noted  . 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 H/O ventral hernia repair 12/13/2015  . History of ileostomy 12/13/2015  . 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 Peristomal hernia 12/09/2015  . Postoperative wound infection 07/16/2015  . Bipolar I disorder, most recent episode depressed ([place]) 07/01/2015  . Borderline personality disorder 07/01/2015  . Alcohol abuse 07/01/2015  . Wound infection after surgery 06/25/2015  . Post-operative wound abscess 06/23/2015  . Ventral hernia 06/13/2015  . 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 Hernia, umbilical 84/13/2440  . Affective bipolar disorder ([place]) 05/18/2015  . Chronic pain  05/18/2015  . CC (Crohn's colitis) ([place]) 05/18/2015  . Clinical depression 05/18/2015  . Failure of erection 05/18/2015  . 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 Personal history of urinary disorder 05/18/2015  . H/O malignant neoplasm 05/18/2015  . H/O: substance abuse 05/18/2015  . Personal history of arthritis 05/18/2015  . BP (high blood pressure) 05/18/2015  . 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 Colitis gravis ([place]) 05/18/2015  . B12 deficiency 05/18/2015  . 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 APPENDECTOMY  1990   TAKEN OUT WITH COLECTOMY  . APPLICATION OF WOUND VAC N/A 07/01/2015   Procedure: APPLICATION OF WOUND VAC;  Surgeon: Robert Bellow, MD;  Location: [facility] ORS;  Service: General;  Laterality: N/A;  . COLECTOMY  1990  . HAND SURGERY  2011  . ILEOSTOMY  1990  . MINOR APPLICATION OF WOUND VAC N/A 06/29/2015   Procedure: MINOR APPLICATION OF WOUND VAC;  Surgeon: Robert Bellow, MD;  Location: [facility];  Service: General;  Laterality: N/A;  . MINOR APPLICATION OF WOUND VAC N/A 07/01/2015   Procedure: WOUND VAC CHANGE;  Surgeon: Robert Bellow, MD;  Location: [facility];  Service: General;  Laterality: N/A;  . OTHER SURGICAL HISTORY     MULTIPLE ABDOMINAL SURGERIES  . SHOULDER SURGERY  2001  . TOTAL COLECTOMY  1990  . UMBILICAL HERNIA REPAIR  2001  . VENTRAL HERNIA REPAIR N/A 06/13/2015  Procedure: HERNIA REPAIR VENTRAL ADULT;  Surgeon: Robert Bellow, MD;  Location: [facility] OR[facility];  Service: General;  Laterality: N/A;  . WOUND DEBRIDEMENT N/A 06/29/2015   Procedure: DEBRIDEMENT ABDOMINAL WOUND;  Surgeon: Robert Bellow, MD;  Location: [facility];  Service: General;  Laterality: N/A;  . WOUND DEBRIDEMENT N/A 07/05/2015   Procedure: Delayed primary closure of abdominal  wound ;  Surgeon: Robert Bellow, MD;  Location: [facility] OR[facility];  Service: General;  Laterality: N/A;    Prior to Admission medications   Medication Sig Start Date End Date Taking? Authorizing Provider  clindamycin (CLEOCIN) 300 MG capsule Take 1 capsule (300 mg total) by mouth 3 (three) times daily. 03/17/17 03/27/17  Little, Traci M, PA-C  doxycycline (VIBRA-TABS) 100 MG tablet Take 1 tablet (100 mg total) by mouth 2 (two) times daily. 11/16/16   Chrismon, Vickki Muff, PA  feeding supplement (BOOST / RESOURCE BREEZE) LIQD Take 1 Container by mouth daily. Reported on 12/22/2015    [provider]  ibuprofen (ADVIL,MOTRIN) 200 MG tablet Take 200 mg by mouth every 4 (four) hours as needed for mild pain or moderate pain.    [provider]  lisinopril (PRINIVIL,ZESTRIL) 5 MG tablet Take 1 tablet (5 mg total) by mouth daily. 06/26/16   Jerrol Banana., MD  metoCLOPramide (REGLAN) 10 MG tablet Take 1 tablet (10 mg total) by mouth 4 (four) times daily -  before meals and at bedtime. 06/26/16   Jerrol Banana., MD  oxyCODONE (OXY IR/ROXICODONE) 5 MG immediate release tablet  11/20/15   [provider]  QUEtiapine (SEROQUEL) 400 MG tablet Take 1 tablet (400 mg total) by mouth at bedtime. 12/07/16   Chrismon, Vickki Muff, PA  sildenafil (VIAGRA) 100 MG tablet TAKE 1 TABLET BY MOUTH 1 TO 4 HOURS PRIOR TO INTERCOURSE 02/01/17   Chrismon, Vickki Muff, PA    Allergies Cefaclor and Ciprofloxacin  Family History  Problem Relation Age of Onset  . Hyperlipidemia Mother   . Diabetes Father   . Vascular Disease Father   . Alcohol abuse Father   . Depression Father   . Hyperlipidemia Brother   . CVA Maternal Grandfather     Social History Social History  Substance Use Topics  . Smoking status: Current Every Day Smoker    Packs/day: 0.50    Years: 16.00    Types: Cigarettes  . Smokeless tobacco: Never Used     Comment: has been quit "couple of months"  . Alcohol use 0.0 oz/week       Comment: OCC    Review of Systems Constitutional: Positive for fever/chills Eyes: No visual changes. ENT: Positive for sore throat and for difficulty swallowing. Submandibular swelling without erythema.  Cardiovascular: Denies chest pain. Respiratory: Denies cough Denies shortness of breath. Gastrointestinal: No abdominal pain.  No nausea, vomiting, diarrhea. Genitourinary: Negative for dysuria. Musculoskeletal: Negative for back pain. Negative for generalized body aches. Skin: Negative for rash. Neurological: Negative for headaches.  Negative focal weakness or numbness. Negative for loss of consciousness. Able to ambulate. ____________________________________________   PHYSICAL EXAM:  VITAL SIGNS: Patient Vitals for the past 24 hrs:  BP Temp src Pulse Resp SpO2 Weight  03/17/17 1453 135/81 - 80 18 97 % -  03/17/17 1129 - - - - - 100.7 kg (222 lb)  03/17/17 1128 (!) 153/95 Oral 96 16 96 % -    Constitutional: Alert and oriented. Well appearing and in no acute distress.  Head: Normocephalic and atraumatic.  Eyes: Conjunctivae are normal. PERRL. Normal extraocular movements.  Ears: Canals clear. TMs intact bilaterally. Nose: No congestion/rhinorrhea Mouth/Throat: Mucous membranes are moist. Oropharynx non-erythematous. Tonsils bilaterally symmetrical.  Neck: indurated along left submandibular region near parotid gland, ~4 cm without erythema Hematological/Lymphatic/Immunological: No cervical lymphadenopathy. Cardiovascular: Normal rate, regular rhythm. Normal distal pulses. Respiratory: Normal respiratory effort. No wheezes/rales/rhonchi. Lungs CTAB. Gastrointestinal: Soft and nontender. No distention. Musculoskeletal: Nontender with normal range of motion in all extremities. Neurologic: Normal speech and language. No gross focal neurologic deficits are appreciated. Skin:  Skin is warm, dry and intact. No rash noted. Psychiatric: Mood and affect are normal.   ____________________________________________   LABS (all labs ordered are listed, but only abnormal results are displayed)  Labs Reviewed  COMPREHENSIVE METABOLIC PANEL - Abnormal; Notable for the following:       Result Value   Glucose, Bld 151 (*)    All other components within normal limits  CBC WITH DIFFERENTIAL/PLATELET - Abnormal; Notable for the following:    WBC 11.1 (*)    Neutro Abs 9.3 (*)    All other components within normal limits   ____________________________________________  EKG none ____________________________________________  RADIOLOGY CT soft tissue with contrast IMPRESSION: Asymmetric enhancement and enlargement of the tail of the LEFT parotid consistent with bacterial or viral parotitis. This finding has developed since previous CT neck performed 01/30/2016. No abscess.  Repeat CT neck with contrast scanning in 6-8 weeks recommended, to ensure interval resolution.  No evidence for tonsillitis, nasopharyngeal mass, or eustachian tube dysfunction. ____________________________________________   PROCEDURES  Procedure(s) performed: no    Critical Care performed: no ____________________________________________   INITIAL IMPRESSION / ASSESSMENT AND PLAN / ED COURSE  Pertinent labs & imaging results that were available during my care of the patient were reviewed by me and considered in my medical decision making (see chart for details).  Patient presented with indurated swelling along left submandibular region that developed approximately 1 week ago. History, physical exam findings, imaging and labs were reassuring symptoms associated with bacterial parotitis without abscess. Patient is afebrile at this time. Patient will be prescribed clindamycin for antibiotic coverage and advised to follow up with his primary care provider. Reassessment and vital signs were reassuring at time of discharge.  Patient informed of clinical course, understand  medical decision-making process, and agree with plan.  Patient was advised to follow up with PCP as needed and was also advised to return to the emergency department for symptoms that change or worsen.     ____________________________________________   FINAL CLINICAL IMPRESSION(S) / ED DIAGNOSES  Final diagnoses:  Parotiditis       NEW MEDICATIONS STARTED DURING THIS VISIT:  Discharge Medication List as of 03/17/2017  2:52 PM    START taking these medications   Details  clindamycin (CLEOCIN) 300 MG capsule Take 1 capsule (300 mg total) by mouth 3 (three) times daily., Starting Sun 03/17/2017, Until Wed 03/27/2017, Print         Note:  This document was prepared using Dragon voice recognition software and may include unintentional dictation errors.    Jerolyn Shin, PA-C 03/17/17 1916    Schuyler Amor, MD 03/18/17 1116

## 2017-03-17 NOTE — ED Notes (Signed)
See triage note  States he developed some pain and swelling to left side of neck/gland  Hx of same  Denies any fever   States pain is moving up into ear

## 2017-03-17 NOTE — ED Triage Notes (Signed)
Pt to ED reporting sore throat and swelling in left side of neck. Pt reports having had throat pain and left ear pain for the past week and verbalized concern with swelling and increased pain today. No fevers.

## 2017-05-20 ENCOUNTER — Encounter: Payer: Self-pay | Admitting: Nurse Practitioner

## 2017-05-20 ENCOUNTER — Ambulatory Visit: Payer: Medicaid Other | Attending: Pain Medicine | Admitting: Nurse Practitioner

## 2017-05-20 VITALS — BP 139/100 | HR 97 | Temp 98.4°F | Resp 16 | Ht 71.0 in | Wt 220.0 lb

## 2017-05-20 DIAGNOSIS — Z823 Family history of stroke: Secondary | ICD-10-CM | POA: Insufficient documentation

## 2017-05-20 DIAGNOSIS — G8929 Other chronic pain: Secondary | ICD-10-CM | POA: Insufficient documentation

## 2017-05-20 DIAGNOSIS — K219 Gastro-esophageal reflux disease without esophagitis: Secondary | ICD-10-CM | POA: Diagnosis not present

## 2017-05-20 DIAGNOSIS — Z79891 Long term (current) use of opiate analgesic: Secondary | ICD-10-CM | POA: Insufficient documentation

## 2017-05-20 DIAGNOSIS — I10 Essential (primary) hypertension: Secondary | ICD-10-CM | POA: Insufficient documentation

## 2017-05-20 DIAGNOSIS — G894 Chronic pain syndrome: Secondary | ICD-10-CM | POA: Insufficient documentation

## 2017-05-20 DIAGNOSIS — Z833 Family history of diabetes mellitus: Secondary | ICD-10-CM | POA: Insufficient documentation

## 2017-05-20 DIAGNOSIS — M79641 Pain in right hand: Secondary | ICD-10-CM

## 2017-05-20 DIAGNOSIS — Z818 Family history of other mental and behavioral disorders: Secondary | ICD-10-CM | POA: Insufficient documentation

## 2017-05-20 DIAGNOSIS — F17210 Nicotine dependence, cigarettes, uncomplicated: Secondary | ICD-10-CM | POA: Insufficient documentation

## 2017-05-20 DIAGNOSIS — Z9889 Other specified postprocedural states: Secondary | ICD-10-CM | POA: Diagnosis not present

## 2017-05-20 DIAGNOSIS — E538 Deficiency of other specified B group vitamins: Secondary | ICD-10-CM | POA: Diagnosis not present

## 2017-05-20 DIAGNOSIS — F319 Bipolar disorder, unspecified: Secondary | ICD-10-CM | POA: Diagnosis not present

## 2017-05-20 DIAGNOSIS — C801 Malignant (primary) neoplasm, unspecified: Secondary | ICD-10-CM | POA: Diagnosis not present

## 2017-05-20 DIAGNOSIS — R202 Paresthesia of skin: Secondary | ICD-10-CM

## 2017-05-20 DIAGNOSIS — M79601 Pain in right arm: Secondary | ICD-10-CM | POA: Insufficient documentation

## 2017-05-20 DIAGNOSIS — Z811 Family history of alcohol abuse and dependence: Secondary | ICD-10-CM | POA: Insufficient documentation

## 2017-05-20 DIAGNOSIS — F1190 Opioid use, unspecified, uncomplicated: Secondary | ICD-10-CM | POA: Insufficient documentation

## 2017-05-20 DIAGNOSIS — M109 Gout, unspecified: Secondary | ICD-10-CM | POA: Insufficient documentation

## 2017-05-20 DIAGNOSIS — M79603 Pain in arm, unspecified: Secondary | ICD-10-CM

## 2017-05-20 DIAGNOSIS — Z79899 Other long term (current) drug therapy: Secondary | ICD-10-CM | POA: Insufficient documentation

## 2017-05-20 DIAGNOSIS — M79643 Pain in unspecified hand: Secondary | ICD-10-CM

## 2017-05-20 DIAGNOSIS — Z881 Allergy status to other antibiotic agents status: Secondary | ICD-10-CM | POA: Diagnosis not present

## 2017-05-20 NOTE — Progress Notes (Signed)
Safety precautions to be maintained throughout the outpatient stay will include: orient to surroundings, keep bed in low position, maintain call bell within reach at all times, provide assistance with transfer out of bed and ambulation.  

## 2017-05-20 NOTE — Progress Notes (Signed)
Patient's Name: Joshua Mays  MRN: [id number]  Referring Provider: Roetta Sessions Spring*  DOB: [birth year]  PCP: Jerrol Banana., MD  DOS: 05/20/2017  Note by: Dionisio David NP  Service setting: Ambulatory outpatient  Specialty: Interventional Pain Management  Location: [facility] (AMB) Pain Management Facility    Patient type: New Patient    Primary Reason(s) for Visit: Initial Patient Evaluation CC: Hand Pain (right)  HPI  Mr. Champney is a 51 y.o. year old, male patient, who comes today for an initial evaluation. 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 His primarily concern today is the Hand Pain (right)  Pain Assessment: Location: Right Wrist Radiating: fingers and wrist (more pain where the scar tissue  is) Onset: More than a month ago Duration: Chronic pain Quality: Aching, Constant, Radiating, Sharp Severity: 7 /10 (self-reported pain score)  Note: Reported level is compatible with observation.                   Effect on ADL: pace self Timing: Constant Modifying factors: medicine    Onset and Duration: Gradual, Date of onset: 1/14 and Present longer than 3 months Cause of pain: cancer,pain Severity: No change since onset, NAS-11 at its worse: 10/10, NAS-11 at its best: 6/10, NAS-11 now: 8/10 and NAS-11 on the average: 8/10 Timing: Afternoon, Night, During activity or exercise and After activity or exercise Aggravating Factors: Lifiting, Motion and Surgery made it worse Alleviating Factors: Cold packs Associated Problems: Numbness, Pain that wakes patient up and Pain that does not allow patient to sleep Quality of Pain: Agonizing, Burning, Cramping, Disabling,  Pulsating, Sharp and Uncomfortable Previous Examinations or Tests: CT scan, MRI scan and X-rays Previous Treatments: Narcotic medications  The patient comes into the clinics today for the first time for a chronic pain management evaluation. According to the patient's primary area of pain is in his right hand. This is secondary to tumor removal in January 2014; Rhabdomyosarcoma. He admits that he underwent 25 radiation treatments to the scar tissue. He admits that he has some numbness and tingling and weakness in his right upper extremity. He denies any previous procedures. He denies any physical therapy. He admits that he will see his oncologist next month for MRI and PET scan. He admits that he began an oxycodone treatment which was effective 3 times daily however since this medication has been adjusted, his pain has not been in control.  Today I took the time to provide the patient with information regarding this pain practice. The patient was informed that the practice is divided into two sections: an interventional pain management section, as well as a completely separate and distinct medication management section. I explained that there are procedure days for interventional therapies, and evaluation days for follow-ups and medication management. Because of the amount of documentation required during both, they are kept separated. This means that there is the possibility that hemay be scheduled for a procedure on one day, and medication management the next. I have also informed himthat because of staffing and facility limitations, this practice will no longer take patients for medication management only. To illustrate the reasons for this, I gave the patient the example of surgeons, and how inappropriate it would be to refer  a patient to his care, just to write for the post-surgical antibiotics on a surgery done by a different surgeon.   Because interventional pain management is part of the  board-certified specialty for the doctors, the patient was informed that joining this practice means that they are open to any and all interventional therapies. I made it clear that this does not mean that they will be forced to have any procedures done. What this means is that I believe interventional therapies to be essential part of the diagnosis and proper management of chronic pain conditions. Therefore, patients not interested in these interventional alternatives will be better served under the care of a different practitioner.  The patient was also made aware of my Comprehensive Pain Management Safety Guidelines where by joining this practice, they limit all of their nerve blocks and joint injections to those done by our practice, for as long as we are retained to manage their care. Historic Controlled Substance Pharmacotherapy Review  PMP and historical list of controlled substances: oxycodone 5 mg, clonazepam 0.5 mg, hydrocodone/acetaminophen 7.5/325 mg, hydrocodone/acetaminophen 10/325 mg, diazepam 2 mg, OxyContin 20 mg, oxycodone 10 mg, hydrocodone/acetaminophen 5/500 mg, Highest opioid analgesic regimen found: oxycodone 5 mg 2 tablets every 3 hours (fill date 06/19/2013)oxycodone 60 mg per day Most recent opioid analgesic: oxycodone 5 mg twice daily(fill date 05/13/2017)oxycodone 10 mg per day Current opioid analgesics: oxycodone 5 mg twice daily(fill date 05/13/2017)oxycodone 10 mg per day Highest recorded MME/day: 24m/day MME/day: 15 mg/day Medications: The patient did not bring the medication(s) to the appointment, as requested in our "New Patient Package" Pharmacodynamics: Desired effects: Analgesia: The patient reports >50% benefit. Reported improvement in function: The patient reports medication allows him to accomplish basic ADLs. Clinically meaningful improvement in function (CMIF): Sustained CMIF goals met Perceived effectiveness: Described as relatively effective, allowing for  increase in activities of daily living (ADL) Undesirable effects: Side-effects or Adverse reactions: None reported Historical Monitoring: The patient  reports that he does not use drugs. List of all UDS Test(s): Lab Results  Component Value Date   MDMA NONE DETECTED 06/13/2015   COCAINSCRNUR NONE DETECTED 06/13/2015   PCPSCRNUR NONE DETECTED 06/13/2015   THCU POSITIVE (A) 06/13/2015   List of all Serum Drug Screening Test(s):  No results found for: AMPHSCRSER, BARBSCRSER, BENZOSCRSER, COCAINSCRSER, PCPSCRSER, PCPQUANT, THCSCRSER, CANNABQUANT, OPIATESCRSER, OXYSCRSER, PROPOXSCRSER Historical Background Evaluation: [place] PDMP: Six (6) year initial data search conducted.             [place] Department of public safety, offender search: (Editor, commissioningInformation) Non-contributory Risk Assessment Profile: Aberrant behavior: None observed or detected today Risk factors for fatal opioid overdose: age [age]years old, bipolar disorder, caucasian, history of substance abuse and male gender Fatal overdose hazard ratio (HR): 1.92 for 50-99 MME/day Non-fatal overdose hazard ratio (HR): 1.44 for 20-49 MME/day Risk of opioid abuse or dependence: 0.7-3.0% with doses ? 36 MME/day and 6.1-26% with doses ? 120 MME/day. Substance use disorder (SUD) risk level: Pending results of Medical Psychology Evaluation for SUD Opioid risk tool (ORT) (Total Score): 1  ORT Scoring interpretation table:  Score <3 = Low Risk for SUD  Score between 4-7 = Moderate Risk for SUD  Score >8 = High Risk for Opioid Abuse   PHQ-2 Depression Scale:  Total score: 0  PHQ-2 Scoring interpretation table: (Score and probability of major depressive disorder)  Score 0 = No depression  Score 1 = 15.4% Probability  Score 2 = 21.1% Probability  Score 3 = 38.4% Probability  Score 4 = 45.5% Probability  Score 5 = 56.4% Probability  Score 6 = 78.6% Probability   PHQ-9 Depression Scale:  Total score: 0  PHQ-9 Scoring interpretation table:   Score 0-4 = No depression  Score 5-9 = Mild depression  Score 10-14 = Moderate depression  Score 15-19 = Moderately severe depression  Score 20-27 = Severe depression (2.4 times higher risk of SUD and 2.89 times higher risk of overuse)   Pharmacologic Plan: Pending ordered tests and/or consults  Meds  The patient has a current medication list which includes the following prescription(s): ibuprofen, oxycodone, oxycodone, quetiapine, and sildenafil.  Current Outpatient Prescriptions on File Prior to Visit  Medication Sig  . ibuprofen (ADVIL,MOTRIN) 200 MG tablet Take 200 mg by mouth every 4 (four) hours as needed for mild pain or moderate pain.  Marland Kitchen oxyCODONE (OXY IR/ROXICODONE) 5 MG immediate release tablet 5 mg as needed.   Marland Kitchen QUEtiapine (SEROQUEL) 400 MG tablet Take 1 tablet (400 mg total) by mouth at bedtime.  . sildenafil (VIAGRA) 100 MG tablet TAKE 1 TABLET BY MOUTH 1 TO 4 HOURS PRIOR TO INTERCOURSE   No current facility-administered medications on file prior to visit.    Imaging Review  Lumbosacral Imaging:  Lumbar DG 2-3 views:  Results for orders placed in visit on 08/18/13  DG Lumbar Spine 2-3 Views   Narrative * PRIOR REPORT IMPORTED FROM AN EXTERNAL SYSTEM *   CLINICAL DATA:  Low back pain   EXAM:  LUMBAR SPINE - 2-3 VIEW   COMPARISON:  None.   FINDINGS:  Vertebral body height is well maintained. Mild disc space narrowing  is noted at L5-S1. Mild retrolisthesis of L4 on L5 is noted. Marland Kitchen   IMPRESSION:  Mild degenerative change without acute abnormality.    Electronically Signed    By: Inez Catalina M.D.    On: 08/18/2013 14:30       Note: Available results from prior imaging studies were reviewed.        ROS  Cardiovascular History: No reported cardiovascular signs or symptoms such as High blood pressure, coronary artery disease, abnormal heart rate or rhythm, heart attack, blood thinner therapy or heart weakness and/or failure Pulmonary or Respiratory  History: Smoking Neurological History: No reported neurological signs or symptoms such as seizures, abnormal skin sensations, urinary and/or fecal incontinence, being born with an abnormal open spine and/or a tethered spinal cord Review of Past Neurological Studies: No results found for this or any previous visit. Psychological-Psychiatric History: Psychiatric disorder, Anxiousness, Depressed and Difficulty sleeping and or falling asleep Gastrointestinal History: Reflux or heatburn Genitourinary History: No reported renal or genitourinary signs or symptoms such as difficulty voiding or producing urine, peeing blood, non-functioning kidney, kidney stones, difficulty emptying the bladder, difficulty controlling the flow of urine, or chronic kidney disease Hematological History: No reported hematological signs or symptoms such as prolonged bleeding, low or poor functioning platelets, bruising or bleeding easily, hereditary bleeding problems, low energy levels due to low hemoglobin or being anemic Endocrine History: No reported endocrine signs or symptoms such as high or low blood sugar, rapid heart rate due to high thyroid levels, obesity or weight gain due to slow thyroid or thyroid disease Rheumatologic History: No reported rheumatological signs and symptoms such as fatigue, joint pain, tenderness, swelling, redness, heat, stiffness, decreased range of motion, with or without associated rash Musculoskeletal History: Negative for myasthenia gravis, muscular dystrophy, multiple sclerosis or malignant hyperthermia Work History: Disabled  Allergies  Mr. Carton is  allergic to cefaclor and ciprofloxacin.  Laboratory Chemistry  Inflammation Markers Lab Results  Component Value Date   CRP 22.8 (H) 08/04/2015   ESRSEDRATE 27 12/22/2015   (CRP: Acute Phase) (ESR: Chronic Phase) Renal Function Markers Lab Results  Component Value Date   BUN 9 03/17/2017   CREATININE 1.16 03/17/2017   GFRAA >60  03/17/2017   GFRNONAA >60 03/17/2017   Hepatic Function Markers Lab Results  Component Value Date   AST 26 03/17/2017   ALT 21 03/17/2017   ALBUMIN 3.6 03/17/2017   ALKPHOS 92 03/17/2017   Electrolytes Lab Results  Component Value Date   NA 138 03/17/2017   K 3.5 03/17/2017   CL 106 03/17/2017   CALCIUM 9.2 03/17/2017   MG 1.9 07/17/2012   Neuropathy Markers No results found for: [id number] Bone Pathology Markers Lab Results  Component Value Date   ALKPHOS 92 03/17/2017   CALCIUM 9.2 03/17/2017   Coagulation Parameters Lab Results  Component Value Date   INR 1.1 06/11/2012   LABPROT 15.0 (H) 06/11/2012   APTT 37.6 (H) 06/11/2012   PLT 241 03/17/2017   Cardiovascular Markers Lab Results  Component Value Date   HGB 13.8 03/17/2017   HCT 40.7 03/17/2017   Note: Lab results reviewed.  [address]  Drug: Mr. Dendinger  reports that he does not use drugs. Alcohol:  reports that he drinks alcohol. Tobacco:  reports that he has been smoking Cigarettes.  He has a 8.00 pack-year smoking history. He has never used smokeless tobacco. Medical:  has a past medical history of Allergy; Bipolar 1 disorder ([place]); Depression; Dysrhythmia; GERD (gastroesophageal reflux disease); Gout; History of substance abuse; Hypertension (2011); Skin cancer (2013); and Ulcerative colitis ([place]) (1990). Family: family history includes Alcohol abuse in his father; CVA in his maternal grandfather; Depression in his father; Diabetes in his father; Hyperlipidemia in his brother and mother; Vascular Disease in his father.  Past Surgical History:  Procedure Laterality Date  . APPENDECTOMY  1990   TAKEN OUT WITH COLECTOMY  . APPLICATION OF WOUND VAC N/A 07/01/2015   Procedure: APPLICATION OF WOUND VAC;  Surgeon: Robert Bellow, MD;  Location: [facility] ORS;  Service: General;  Laterality: N/A;  . COLECTOMY  1990  . HAND SURGERY  2011  . ILEOSTOMY  1990  . MINOR APPLICATION OF WOUND VAC N/A 06/29/2015    Procedure: MINOR APPLICATION OF WOUND VAC;  Surgeon: Robert Bellow, MD;  Location: [facility] ORS;  Service: General;  Laterality: N/A;  . MINOR APPLICATION OF WOUND VAC N/A 07/01/2015   Procedure: WOUND VAC CHANGE;  Surgeon: Robert Bellow, MD;  Location: [facility] ORS;  Service: General;  Laterality: N/A;  . OTHER SURGICAL HISTORY     MULTIPLE ABDOMINAL SURGERIES  . SHOULDER SURGERY  2001  . TOTAL COLECTOMY  1990  . UMBILICAL HERNIA REPAIR  2001  . VENTRAL HERNIA REPAIR N/A 06/13/2015   Procedure: HERNIA REPAIR VENTRAL ADULT;  Surgeon: Robert Bellow, MD;  Location: [facility] ORS;  Service: General;  Laterality: N/A;  . WOUND DEBRIDEMENT N/A 06/29/2015   Procedure: DEBRIDEMENT ABDOMINAL WOUND;  Surgeon: Robert Bellow, MD;  Location: [facility] ORS;  Service: General;  Laterality: N/A;  . WOUND DEBRIDEMENT N/A 07/05/2015   Procedure: Delayed primary closure of abdominal wound ;  Surgeon: Robert Bellow, MD;  Location: [facility] ORS;  Service: General;  Laterality: N/A;   Active Ambulatory Problems    Diagnosis Date Noted  . Affective bipolar disorder ([place]) 05/18/2015  .  Chronic pain 05/18/2015  . CC (Crohn's colitis) ([place]) 05/18/2015  . Clinical depression 05/18/2015  . Failure of erection 05/18/2015  . 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 Personal history of urinary disorder 05/18/2015  . H/O malignant neoplasm 05/18/2015  . H/O: substance abuse 05/18/2015  . Personal history of arthritis 05/18/2015  . BP (high blood pressure) 05/18/2015  . 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 Colitis gravis ([place]) 05/18/2015  . B12 deficiency 05/18/2015  . Hernia, umbilical 89/21/1941  . 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 Ventral hernia 06/13/2015  . Post-operative wound abscess 06/23/2015  . Wound infection after surgery 06/25/2015  . Bipolar I disorder, most recent episode depressed ([place]) 07/01/2015  . Borderline personality  disorder 07/01/2015  . Alcohol abuse 07/01/2015  . Postoperative wound infection 07/16/2015  . 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 Peristomal hernia 12/09/2015  . H/O ventral hernia repair 12/13/2015  . History of ileostomy 12/13/2015  . 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 Long term current use of opiate analgesic 05/20/2017  . Long term prescription opiate use 05/20/2017  . Opiate use 05/20/2017  . Chronic pain syndrome 05/20/2017  . Chronic hand pain (Primary Area of Pain)( Right) 05/20/2017  . Chronic upper extremity pain (Secondary Area of Pain)(right) 05/20/2017  . Paresthesia of right upper extremity 05/20/2017   Resolved Ambulatory Problems    Diagnosis Date Noted  . No Resolved Ambulatory Problems   Past Medical History:  Diagnosis Date  . Allergy   . Bipolar 1 disorder ([place])   . Depression   . Dysrhythmia   . GERD (gastroesophageal reflux disease)   . Gout   . History of substance abuse   . Hypertension 2011  . Skin cancer 2013  . Ulcerative colitis ([place]) 1990   Constitutional Exam  General appearance: Well nourished, well developed, and well hydrated. In no apparent acute distress Vitals:   05/20/17 1101  BP: (!) 139/100  Pulse: 97  Resp: 16  Temp: 98.4 F (36.9 C)  SpO2: 97%  Weight: 220 lb (99.8 kg)  Height: 5' 11" (1.803 m)   BMI Assessment: Estimated body mass index is 30.68 kg/m as calculated from the following:   Height as of this encounter: 5' 11" (1.803 m).   Weight as of this encounter: 220 lb (99.8 kg).  BMI interpretation table: BMI level Category Range association with higher incidence of chronic pain  <18 kg/m2 Underweight   18.5-24.9 kg/m2 Ideal body weight   25-29.9 kg/m2 Overweight Increased incidence by 20%  30-34.9 kg/m2 Obese (Class I) Increased incidence by 68%  35-39.9 kg/m2 Severe obesity (Class II)  Increased incidence by 136%  >40 kg/m2 Extreme obesity (Class III) Increased incidence by 254%   BMI Readings from Last 4 Encounters:  05/20/17 30.68 kg/m  03/17/17 30.96 kg/m  11/16/16 31.07 kg/m  10/19/16 31.10 kg/m   Wt Readings from Last 4 Encounters:  05/20/17 220 lb (99.8 kg)  03/17/17 222 lb (100.7 kg)  11/16/16 222 lb 12.8 oz (101.1 kg)  10/19/16 223 lb (101.2 kg)  Psych/Mental status: Alert, oriented x 3 (person, place, & time)       Eyes: PERLA Respiratory: No evidence of acute respiratory distress  Cervical Spine Exam  Inspection: No masses, redness, or swelling Alignment: Symmetrical Functional ROM: Unrestricted ROM      Stability: No  instability detected Muscle strength & Tone: Functionally intact Sensory: Unimpaired Palpation: No palpable anomalies              Upper Extremity (UE) Exam    Side: Right upper extremity  Side: Left upper extremity  Inspection: No masses, redness, swelling, or asymmetry. No contractures scar from previous surgery visible  Inspection: No masses, redness, swelling, or asymmetry. No contractures  Functional ROM: Unrestricted ROM          Functional ROM: Unrestricted ROM          Muscle strength & Tone: Functionally intact  Muscle strength & Tone: Functionally intact  Sensory: Unimpaired  Sensory: Unimpaired  Palpation: No palpable anomalies              Palpation: No palpable anomalies              Specialized Test(s): Deferred         Specialized Test(s): Deferred          Gait & Posture Assessment  Ambulation: Unassisted Gait: Relatively normal for age and body habitus Posture: WNL   Lower Extremity Exam    Side: Right lower extremity  Side: Left lower extremity  Inspection: No masses, redness, swelling, or asymmetry. No contractures  Inspection: No masses, redness, swelling, or asymmetry. No contractures  Functional ROM: Unrestricted ROM          Functional ROM: Unrestricted ROM          Muscle strength & Tone: Functionally  intact  Muscle strength & Tone: Functionally intact  Sensory: Unimpaired  Sensory: Unimpaired  Palpation: No palpable anomalies  Palpation: No palpable anomalies   Assessment  Primary Diagnosis & Pertinent Problem List: The primary encounter diagnosis was Chronic pain of right hand. Diagnoses of Chronic pain of right upper extremity, Paresthesia of right upper extremity, Chronic pain syndrome, and Long term current use of opiate analgesic were also pertinent to this visit.  Visit Diagnosis: 1. Chronic pain of right hand   2. Chronic pain of right upper extremity   3. Paresthesia of right upper extremity   4. Chronic pain syndrome   5. Long term current use of opiate analgesic    Plan of Care  Initial treatment plan:  Please be advised that as per protocol, today's visit has been an evaluation only. We have not taken over the patient's controlled substance management.  Problem-specific plan: No problem-specific Assessment & Plan notes found for this encounter.  Ordered Lab-work, Procedure(s), Referral(s), & Consult(s): Orders Placed This Encounter  Procedures  . Comprehensive metabolic panel  . C-reactive protein  . Sedimentation rate  . Magnesium  . 25-Hydroxyvitamin D Lcms D2+D3  . Compliance Drug Analysis, Ur  . Vitamin B12  . Ambulatory referral to Psychology   Pharmacotherapy: Medications ordered:  No orders of the defined types were placed in this encounter.  Medications administered during this visit: Mr. Uphoff had no medications administered during this visit.   Pharmacotherapy under consideration:  Opioid Analgesics: The patient was informed that there is no guarantee that he would be a candidate for opioid analgesics. The decision will be made following CDC guidelines. This decision will be based on the results of diagnostic studies, as well as Mr. Benally's risk profile.  Membrane stabilizer: To be determined at a later time Muscle relaxant: To be determined at a  later time NSAID: To be determined at a later time Other analgesic(s): To be determined at a later time   Interventional therapies under  consideration: Mr. Wafer was informed that there is no guarantee that he would be a candidate for interventional therapies. The decision will be based on the results of diagnostic studies, as well as Mr. Spike's risk profile.  Possible procedure(s): Provider to determine   Provider-requested follow-up: Return for 2nd Visit, w/ Dr. Dossie Arbour, after [facility] eval.  No future appointments.  Primary Care Physician: Jerrol Banana., MD Location: [facility] Facility Note by:  Date: 05/20/2017; Time: 3:19 PM  Pain Score Disclaimer: We use the NRS-11 scale. This is a self-reported, subjective measurement of pain severity with only modest accuracy. It is used primarily to identify changes within a particular patient. It must be understood that outpatient pain scales are significantly less accurate that those used for research, where they can be applied under ideal controlled circumstances with minimal exposure to variables. In reality, the score is likely to be a combination of pain intensity and pain affect, where pain affect describes the degree of emotional arousal or changes in action readiness caused by the sensory experience of pain. Factors such as social and work situation, setting, emotional state, anxiety levels, expectation, and prior pain experience may influence pain perception and show large inter-individual differences that may also be affected by time variables.  Patient instructions provided during this appointment: Patient Instructions    ____________________________________________________________________________________________  Appointment Policy Summary  It is our goal and responsibility to provide the medical community with assistance in the evaluation and management of patients with chronic pain. Unfortunately  our resources are limited. Because we do not have an unlimited amount of time, or available appointments, we are required to closely monitor and manage their use. The following rules exist to maximize their use:  Patient's responsibilities: 1. Punctuality:  At what time should I arrive? You should be physically present in our office 30 minutes before your scheduled appointment. Your scheduled appointment is with your assigned healthcare provider. However, it takes 5-10 minutes to be "checked-in", and another 15 minutes for the nurses to do the admission. If you arrive to our office at the time you were given for your appointment, you will end up being at least 20-25 minutes late to your appointment with the provider. 2. Tardiness:  What happens if I arrive only a few minutes after my scheduled appointment time? You will need to reschedule your appointment. The cutoff is your appointment time. This is why it is so important that you arrive at least 30 minutes before that appointment. If you have an appointment scheduled for 10:00 AM and you arrive at 10:01, you will be required to reschedule your appointment.  3. Plan ahead:  Always assume that you will encounter traffic on your way in. Plan for it. If you are dependent on a driver, make sure they understand these rules and the need to arrive early. 4. Other appointments and responsibilities:  Avoid scheduling any other appointments before or after your pain clinic appointments.  5. Be prepared:  Write down everything that you need to discuss with your healthcare provider and give this information to the admitting nurse. Write down the medications that you will need refilled. Bring your pills and bottles (even the empty ones), to all of your appointments, except for those where a procedure is scheduled. 6. No children or pets:  Find someone to take care of them. It is not appropriate to bring them in. 7. Scheduling changes:  We request "advanced  notification" of any changes or cancellations. 8. Advanced  notification:  Defined as a time period of more than 24 hours prior to the originally scheduled appointment. This allows for the appointment to be offered to other patients. 9. Rescheduling:  When a visit is rescheduled, it will require the cancellation of the original appointment. For this reason they both fall within the category of "Cancellations".  10. Cancellations:  They require advanced notification. Any cancellation less than 24 hours before the  appointment will be recorded as a "No Show". 11. No Show:  Defined as an unkept appointment where the patient failed to notify or declare to the practice their intention or inability to keep the appointment.  Corrective process for repeat offenders:  1. Tardiness: Three (3) episodes of rescheduling due to late arrivals will be recorded as one (1) "No Show". 2. Cancellation or reschedule: Three (3) cancellations or rescheduling will be recorded as one (1) "No Show". 3. "No Shows": Three (3) "No Shows" within a 12 month period will result in discharge from the practice.  ____________________________________________________________________________________________  ____________________________________________________________________________________________  Pain Scale  Introduction: The pain score used by this practice is the Verbal Numerical Rating Scale (VNRS-11). This is an 11-point scale. It is for adults and children 10 years or older. There are significant differences in how the pain score is reported, used, and applied. Forget everything you learned in the past and learn this scoring system.  General Information: The scale should reflect your current level of pain. Unless you are specifically asked for the level of your worst pain, or your average pain. If you are asked for one of these two, then it should be understood that it is over the past 24 hours.  Basic Activities of Daily  Living (ADL): Personal hygiene, dressing, eating, transferring, and using restroom.  Instructions: Most patients tend to report their level of pain as a combination of two factors, their physical pain and their psychosocial pain. This last one is also known as "suffering" and it is reflection of how physical pain affects you socially and psychologically. From now on, report them separately. From this point on, when asked to report your pain level, report only your physical pain. Use the following table for reference.  Pain Clinic Pain Levels (0-5/10)  Pain Level Score  Description  No Pain 0   Mild pain 1 Nagging, annoying, but does not interfere with basic activities of daily living (ADL). Patients are able to eat, bathe, get dressed, toileting (being able to get on and off the toilet and perform personal hygiene functions), transfer (move in and out of bed or a chair without assistance), and maintain continence (able to control bladder and bowel functions). Blood pressure and heart rate are unaffected. A normal heart rate for a healthy adult ranges from 60 to 100 bpm (beats per minute).   Mild to moderate pain 2 Noticeable and distracting. Impossible to hide from other people. More frequent flare-ups. Still possible to adapt and function close to normal. It can be very annoying and may have occasional stronger flare-ups. With discipline, patients may get used to it and adapt.   Moderate pain 3 Interferes significantly with activities of daily living (ADL). It becomes difficult to feed, bathe, get dressed, get on and off the toilet or to perform personal hygiene functions. Difficult to get in and out of bed or a chair without assistance. Very distracting. With effort, it can be ignored when deeply involved in activities.   Moderately severe pain 4 Impossible to ignore for more than a few  minutes. With effort, patients may still be able to manage work or participate in some social activities. Very  difficult to concentrate. Signs of autonomic nervous system discharge are evident: dilated pupils (mydriasis); mild sweating (diaphoresis); sleep interference. Heart rate becomes elevated (>115 bpm). Diastolic blood pressure (lower number) rises above 100 mmHg. Patients find relief in laying down and not moving.   Severe pain 5 Intense and extremely unpleasant. Associated with frowning face and frequent crying. Pain overwhelms the senses.  Ability to do any activity or maintain social relationships becomes significantly limited. Conversation becomes difficult. Pacing back and forth is common, as getting into a comfortable position is nearly impossible. Pain wakes you up from deep sleep. Physical signs will be obvious: pupillary dilation; increased sweating; goosebumps; brisk reflexes; cold, clammy hands and feet; nausea, vomiting or dry heaves; loss of appetite; significant sleep disturbance with inability to fall asleep or to remain asleep. When persistent, significant weight loss is observed due to the complete loss of appetite and sleep deprivation.  Blood pressure and heart rate becomes significantly elevated. Caution: If elevated blood pressure triggers a pounding headache associated with blurred vision, then the patient should immediately seek attention at an urgent or emergency care unit, as these may be signs of an impending stroke.    Emergency Department Pain Levels (6-10/10)  Emergency Room Pain 6 Severely limiting. Requires emergency care and should not be seen or managed at an outpatient pain management facility. Communication becomes difficult and requires great effort. Assistance to reach the emergency department may be required. Facial flushing and profuse sweating along with potentially dangerous increases in heart rate and blood pressure will be evident.   Distressing pain 7 Self-care is very difficult. Assistance is required to transport, or use restroom. Assistance to reach the  emergency department will be required. Tasks requiring coordination, such as bathing and getting dressed become very difficult.   Disabling pain 8 Self-care is no longer possible. At this level, pain is disabling. The individual is unable to do even the most "basic" activities such as walking, eating, bathing, dressing, transferring to a bed, or toileting. Fine motor skills are lost. It is difficult to think clearly.   Incapacitating pain 9 Pain becomes incapacitating. Thought processing is no longer possible. Difficult to remember your own name. Control of movement and coordination are lost.   The worst pain imaginable 10 At this level, most patients pass out from pain. When this level is reached, collapse of the autonomic nervous system occurs, leading to a sudden drop in blood pressure and heart rate. This in turn results in a temporary and dramatic drop in blood flow to the brain, leading to a loss of consciousness. Fainting is one of the body's self defense mechanisms. Passing out puts the brain in a calmed state and causes it to shut down for a while, in order to begin the healing process.    Summary: 1. Refer to this scale when providing [place] with your pain level. 2. Be accurate and careful when reporting your pain level. This will help with your care. 3. Over-reporting your pain level will lead to loss of credibility. 4. Even a level of 1/10 means that there is pain and will be treated at our facility. 5. High, inaccurate reporting will be documented as "Symptom Exaggeration", leading to loss of credibility and suspicions of possible secondary gains such as obtaining more narcotics, or wanting to appear disabled, for fraudulent reasons. 6. Only pain levels of 5 or below will  be seen at our facility. 7. Pain levels of 6 and above will be sent to the Emergency Department and the appointment  cancelled. ____________________________________________________________________________________________  Pain Management Discharge Instructions  General Discharge Instructions :  If you need to reach your doctor call: Monday-Friday 8:00 am - 4:00 pm at [phone] or toll free [phone].  After clinic hours [phone] to have operator reach doctor.  Bring all of your medication bottles to all your appointments in the pain clinic.  To cancel or reschedule your appointment with Pain Management please remember to call 24 hours in advance to avoid a fee.  Refer to the educational materials which you have been given on: General Risks, I had my Procedure. Discharge Instructions, Post Sedation.  Post Procedure Instructions:  The drugs you were given will stay in your system until tomorrow, so for the next 24 hours you should not drive, make any legal decisions or drink any alcoholic beverages.  You may eat anything you prefer, but it is better to start with liquids then soups and crackers, and gradually work up to solid foods.  Please notify your doctor immediately if you have any unusual bleeding, trouble breathing or pain that is not related to your normal pain.  Depending on the type of procedure that was done, some parts of your body may feel week and/or numb.  This usually clears up by tonight or the next day.  Walk with the use of an assistive device or accompanied by an adult for the 24 hours.  You may use ice on the affected area for the first 24 hours.  Put ice in a Ziploc bag and cover with a towel and place against area 15 minutes on 15 minutes off.  You may switch to heat after 24 hours.

## 2017-05-20 NOTE — Patient Instructions (Addendum)
____________________________________________________________________________________________  Appointment Policy Summary  It is our goal and responsibility to provide the medical community with assistance in the evaluation and management of patients with chronic pain. Unfortunately our resources are limited. Because we do not have an unlimited amount of time, or available appointments, we are required to closely monitor and manage their use. The following rules exist to maximize their use:  Patient's responsibilities: 1. Punctuality:  At what time should I arrive? You should be physically present in our office 30 minutes before your scheduled appointment. Your scheduled appointment is with your assigned healthcare provider. However, it takes 5-10 minutes to be "checked-in", and another 15 minutes for the nurses to do the admission. If you arrive to our office at the time you were given for your appointment, you will end up being at least 20-25 minutes late to your appointment with the provider. 2. Tardiness:  What happens if I arrive only a few minutes after my scheduled appointment time? You will need to reschedule your appointment. The cutoff is your appointment time. This is why it is so important that you arrive at least 30 minutes before that appointment. If you have an appointment scheduled for 10:00 AM and you arrive at 10:01, you will be required to reschedule your appointment.  3. Plan ahead:  Always assume that you will encounter traffic on your way in. Plan for it. If you are dependent on a driver, make sure they understand these rules and the need to arrive early. 4. Other appointments and responsibilities:  Avoid scheduling any other appointments before or after your pain clinic appointments.  5. Be prepared:  Write down everything that you need to discuss with your healthcare provider and give this information to the admitting nurse. Write down the medications that you will need  refilled. Bring your pills and bottles (even the empty ones), to all of your appointments, except for those where a procedure is scheduled. 6. No children or pets:  Find someone to take care of them. It is not appropriate to bring them in. 7. Scheduling changes:  We request "advanced notification" of any changes or cancellations. 8. Advanced notification:  Defined as a time period of more than 24 hours prior to the originally scheduled appointment. This allows for the appointment to be offered to other patients. 9. Rescheduling:  When a visit is rescheduled, it will require the cancellation of the original appointment. For this reason they both fall within the category of "Cancellations".  10. Cancellations:  They require advanced notification. Any cancellation less than 24 hours before the  appointment will be recorded as a "No Show". 11. No Show:  Defined as an unkept appointment where the patient failed to notify or declare to the practice their intention or inability to keep the appointment.  Corrective process for repeat offenders:  1. Tardiness: Three (3) episodes of rescheduling due to late arrivals will be recorded as one (1) "No Show". 2. Cancellation or reschedule: Three (3) cancellations or rescheduling will be recorded as one (1) "No Show". 3. "No Shows": Three (3) "No Shows" within a 12 month period will result in discharge from the practice.  ____________________________________________________________________________________________  ____________________________________________________________________________________________  Pain Scale  Introduction: The pain score used by this practice is the Verbal Numerical Rating Scale (VNRS-11). This is an 11-point scale. It is for adults and children 10 years or older. There are significant differences in how the pain score is reported, used, and applied. Forget everything you learned in the past and  learn this scoring  system.  General Information: The scale should reflect your current level of pain. Unless you are specifically asked for the level of your worst pain, or your average pain. If you are asked for one of these two, then it should be understood that it is over the past 24 hours.  Basic Activities of Daily Living (ADL): Personal hygiene, dressing, eating, transferring, and using restroom.  Instructions: Most patients tend to report their level of pain as a combination of two factors, their physical pain and their psychosocial pain. This last one is also known as "suffering" and it is reflection of how physical pain affects you socially and psychologically. From now on, report them separately. From this point on, when asked to report your pain level, report only your physical pain. Use the following table for reference.  Pain Clinic Pain Levels (0-5/10)  Pain Level Score  Description  No Pain 0   Mild pain 1 Nagging, annoying, but does not interfere with basic activities of daily living (ADL). Patients are able to eat, bathe, get dressed, toileting (being able to get on and off the toilet and perform personal hygiene functions), transfer (move in and out of bed or a chair without assistance), and maintain continence (able to control bladder and bowel functions). Blood pressure and heart rate are unaffected. A normal heart rate for a healthy adult ranges from 60 to 100 bpm (beats per minute).   Mild to moderate pain 2 Noticeable and distracting. Impossible to hide from other people. More frequent flare-ups. Still possible to adapt and function close to normal. It can be very annoying and may have occasional stronger flare-ups. With discipline, patients may get used to it and adapt.   Moderate pain 3 Interferes significantly with activities of daily living (ADL). It becomes difficult to feed, bathe, get dressed, get on and off the toilet or to perform personal hygiene functions. Difficult to get in and out of  bed or a chair without assistance. Very distracting. With effort, it can be ignored when deeply involved in activities.   Moderately severe pain 4 Impossible to ignore for more than a few minutes. With effort, patients may still be able to manage work or participate in some social activities. Very difficult to concentrate. Signs of autonomic nervous system discharge are evident: dilated pupils (mydriasis); mild sweating (diaphoresis); sleep interference. Heart rate becomes elevated (>115 bpm). Diastolic blood pressure (lower number) rises above 100 mmHg. Patients find relief in laying down and not moving.   Severe pain 5 Intense and extremely unpleasant. Associated with frowning face and frequent crying. Pain overwhelms the senses.  Ability to do any activity or maintain social relationships becomes significantly limited. Conversation becomes difficult. Pacing back and forth is common, as getting into a comfortable position is nearly impossible. Pain wakes you up from deep sleep. Physical signs will be obvious: pupillary dilation; increased sweating; goosebumps; brisk reflexes; cold, clammy hands and feet; nausea, vomiting or dry heaves; loss of appetite; significant sleep disturbance with inability to fall asleep or to remain asleep. When persistent, significant weight loss is observed due to the complete loss of appetite and sleep deprivation.  Blood pressure and heart rate becomes significantly elevated. Caution: If elevated blood pressure triggers a pounding headache associated with blurred vision, then the patient should immediately seek attention at an urgent or emergency care unit, as these may be signs of an impending stroke.    Emergency Department Pain Levels (6-10/10)  Emergency Room Pain 6   Severely limiting. Requires emergency care and should not be seen or managed at an outpatient pain management facility. Communication becomes difficult and requires great effort. Assistance to reach the  emergency department may be required. Facial flushing and profuse sweating along with potentially dangerous increases in heart rate and blood pressure will be evident.   Distressing pain 7 Self-care is very difficult. Assistance is required to transport, or use restroom. Assistance to reach the emergency department will be required. Tasks requiring coordination, such as bathing and getting dressed become very difficult.   Disabling pain 8 Self-care is no longer possible. At this level, pain is disabling. The individual is unable to do even the most "basic" activities such as walking, eating, bathing, dressing, transferring to a bed, or toileting. Fine motor skills are lost. It is difficult to think clearly.   Incapacitating pain 9 Pain becomes incapacitating. Thought processing is no longer possible. Difficult to remember your own name. Control of movement and coordination are lost.   The worst pain imaginable 10 At this level, most patients pass out from pain. When this level is reached, collapse of the autonomic nervous system occurs, leading to a sudden drop in blood pressure and heart rate. This in turn results in a temporary and dramatic drop in blood flow to the brain, leading to a loss of consciousness. Fainting is one of the body's self defense mechanisms. Passing out puts the brain in a calmed state and causes it to shut down for a while, in order to begin the healing process.    Summary: 1. Refer to this scale when providing [place] with your pain level. 2. Be accurate and careful when reporting your pain level. This will help with your care. 3. Over-reporting your pain level will lead to loss of credibility. 4. Even a level of 1/10 means that there is pain and will be treated at our facility. 5. High, inaccurate reporting will be documented as "Symptom Exaggeration", leading to loss of credibility and suspicions of possible secondary gains such as obtaining more narcotics, or wanting to appear  disabled, for fraudulent reasons. 6. Only pain levels of 5 or below will be seen at our facility. 7. Pain levels of 6 and above will be sent to the Emergency Department and the appointment cancelled. ____________________________________________________________________________________________  Pain Management Discharge Instructions  General Discharge Instructions :  If you need to reach your doctor call: Monday-Friday 8:00 am - 4:00 pm at [phone] or toll free [phone].  After clinic hours [phone] to have operator reach doctor.  Bring all of your medication bottles to all your appointments in the pain clinic.  To cancel or reschedule your appointment with Pain Management please remember to call 24 hours in advance to avoid a fee.  Refer to the educational materials which you have been given on: General Risks, I had my Procedure. Discharge Instructions, Post Sedation.  Post Procedure Instructions:  The drugs you were given will stay in your system until tomorrow, so for the next 24 hours you should not drive, make any legal decisions or drink any alcoholic beverages.  You may eat anything you prefer, but it is better to start with liquids then soups and crackers, and gradually work up to solid foods.  Please notify your doctor immediately if you have any unusual bleeding, trouble breathing or pain that is not related to your normal pain.  Depending on the type of procedure that was done, some parts of your body may feel week and/or numb.  This usually clears up by tonight or the next day.  Walk with the use of an assistive device or accompanied by an adult for the 24 hours.  You may use ice on the affected area for the first 24 hours.  Put ice in a Ziploc bag and cover with a towel and place against area 15 minutes on 15 minutes off.  You may switch to heat after 24 hours.

## 2017-05-28 ENCOUNTER — Telehealth (HOSPITAL_COMMUNITY): Payer: Self-pay

## 2017-07-25 ENCOUNTER — Encounter: Payer: Self-pay | Admitting: Family Medicine

## 2017-07-25 ENCOUNTER — Ambulatory Visit (INDEPENDENT_AMBULATORY_CARE_PROVIDER_SITE_OTHER): Payer: Medicaid Other | Admitting: Family Medicine

## 2017-07-25 VITALS — BP 130/78 | HR 103 | Temp 98.1°F | Wt 225.6 lb

## 2017-07-25 DIAGNOSIS — K50119 Crohn's disease of large intestine with unspecified complications: Secondary | ICD-10-CM

## 2017-07-25 DIAGNOSIS — R5383 Other fatigue: Secondary | ICD-10-CM

## 2017-07-25 DIAGNOSIS — R0600 Dyspnea, unspecified: Secondary | ICD-10-CM | POA: Diagnosis not present

## 2017-07-25 LAB — POCT URINALYSIS DIPSTICK
Bilirubin, UA: NEGATIVE
Glucose, UA: NEGATIVE
KETONES UA: NEGATIVE
LEUKOCYTES UA: NEGATIVE
Nitrite, UA: NEGATIVE
PH UA: 6 (ref 5.0–8.0)
Spec Grav, UA: >=1.03 — AB (ref 1.010–1.025)
UROBILINOGEN UA: 0.2 U/dL

## 2017-07-25 NOTE — Progress Notes (Signed)
Patient: Joshua Mays Male    DOB: 1965-11-10   51 y.o.   MRN: [id number] Visit Date: 07/25/2017  Today's Provider: Vernie Murders, PA   Chief Complaint  Patient presents with  . Decreased energy   Subjective:    HPI Patient presents today with complaints of decreased energy and feeling fatigue the past couple of months. The symptoms are worsening. He reports if he starts a activity he has to stop within 10 minutes due to no energy.   Colonoscopy: Never. Patient reports at age 47 he had ulcerative colitis and colon was removed.     Tetanus: Patient states he had a tetanus vaccine within the last 10 years at ER for a laceration.   Past Medical History:  Diagnosis Date  . Allergy   . Bipolar 1 disorder ([place])   . Depression   . Dysrhythmia    "FLUTTERING"  . GERD (gastroesophageal reflux disease)    NO MEDS-WELL-CONTROLLED  . Gout   . History of substance abuse   . Hypertension 2011   OFF MEDS SINCE 2014-EATING BETTER AND LOST WEIGHT-BP BETTER PER PT  . 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 Ulcerative colitis ([place]) 1990   Past Surgical History:  Procedure Laterality Date  . APPENDECTOMY  1990   TAKEN OUT WITH COLECTOMY  . APPLICATION OF WOUND VAC N/A 07/01/2015   Procedure: APPLICATION OF WOUND VAC;  Surgeon: Robert Bellow, MD;  Location: [facility];  Service: General;  Laterality: N/A;  . COLECTOMY  1990  . HAND SURGERY  2011  . ILEOSTOMY  1990  . MINOR APPLICATION OF WOUND VAC N/A 06/29/2015   Procedure: MINOR APPLICATION OF WOUND VAC;  Surgeon: Robert Bellow, MD;  Location: [facility];  Service: General;  Laterality: N/A;  . MINOR APPLICATION OF WOUND VAC N/A 07/01/2015   Procedure: WOUND VAC CHANGE;  Surgeon: Robert Bellow, MD;  Location: [facility];  Service: General;  Laterality: N/A;  . OTHER SURGICAL HISTORY     MULTIPLE ABDOMINAL SURGERIES  . SHOULDER SURGERY  2001  . TOTAL COLECTOMY  1990  . UMBILICAL HERNIA REPAIR  2001   . VENTRAL HERNIA REPAIR N/A 06/13/2015   Procedure: HERNIA REPAIR VENTRAL ADULT;  Surgeon: Robert Bellow, MD;  Location: [facility];  Service: General;  Laterality: N/A;  . WOUND DEBRIDEMENT N/A 06/29/2015   Procedure: DEBRIDEMENT ABDOMINAL WOUND;  Surgeon: Robert Bellow, MD;  Location: [facility];  Service: General;  Laterality: N/A;  . WOUND DEBRIDEMENT N/A 07/05/2015   Procedure: Delayed primary closure of abdominal wound ;  Surgeon: Robert Bellow, MD;  Location: [facility];  Service: General;  Laterality: N/A;   Family History  Problem Relation Age of Onset  . Hyperlipidemia Mother   . Diabetes Father   . Vascular Disease Father   . Alcohol abuse Father   . Depression Father   . Hyperlipidemia Brother   . CVA Maternal Grandfather    Allergies  Allergen Reactions  . Cefaclor Rash and Hives  . Ciprofloxacin Rash and Hives   Previous Medications   IBUPROFEN (ADVIL,MOTRIN) 200 MG TABLET    Take 200 mg by mouth every 4 (four) hours as needed for mild pain or moderate pain.   OXYCODONE (OXY IR/ROXICODONE) 5 MG IMMEDIATE RELEASE TABLET    5 mg as needed.    OXYCODONE (OXY IR/ROXICODONE) 5 MG IMMEDIATE RELEASE TABLET    Take 5 mg by mouth.  QUETIAPINE (SEROQUEL) 400 MG TABLET    Take 1 tablet (400 mg total) by mouth at bedtime.   SILDENAFIL (VIAGRA) 100 MG TABLET    TAKE 1 TABLET BY MOUTH 1 TO 4 HOURS PRIOR TO INTERCOURSE    Review of Systems  Constitutional: Positive for appetite change and fatigue.  Respiratory: Negative.   Cardiovascular: Negative.     Social History  Substance Use Topics  . Smoking status: Current Some Day Smoker    Packs/day: 0.50    Years: 16.00    Types: Cigarettes  . Smokeless tobacco: Never Used     Comment: has been quit "couple of months"  . Alcohol use No   Objective:   BP 130/78 (BP Location: Right Arm, Patient Position: Sitting, Cuff Size: Normal)   Pulse (!) 103   Temp 98.1 F (36.7 C) (Oral)   Wt 225 lb 9.6 oz (102.3 kg)   SpO2  96%   BMI 31.46 kg/m  Wt Readings from Last 3 Encounters:  07/25/17 225 lb 9.6 oz (102.3 kg)  05/20/17 220 lb (99.8 kg)  03/17/17 222 lb (100.7 kg)   Physical Exam  Constitutional: He is oriented to person, place, and time. He appears well-developed and well-nourished.  HENT:  Head: Normocephalic.  Eyes: Conjunctivae are normal.  Neck: Neck supple. No thyromegaly present.  Cardiovascular: Normal rate and normal heart sounds.   Pulmonary/Chest: Effort normal and breath sounds normal.  Abdominal: Soft. Bowel sounds are normal.  RLQ ileostomy.  Musculoskeletal: Normal range of motion.  Lymphadenopathy:    He has no cervical adenopathy.  Neurological: He is alert and oriented to person, place, and time. He has normal reflexes.  Psychiatric: His behavior is normal. Thought content normal.      Assessment & Plan:     1. Fatigue, unspecified type Progressive recently. Having trouble doing much without need to stop and rest over the past couple months. No hematemesis, hematochezia, hematuria or polyuria. Denies cough, congestion, nausea or vomiting. Appetite has been diminished but has gained 5 lbs since August 2018. Psychiatrist has decreased Seroquel to 200 mg qd to see if this was part of the reason for weight gain. EKG normal today. Will check labs. Urinalysis showed concentrated specimen ([id number]) but no sign of infection. - EKG 12-Lead - CBC with Differential/Platelet - Comprehensive metabolic panel - TSH - POCT urinalysis dipstick  2. Dyspnea, unspecified type Notice shortness of breath with exertion recently. EKG NSR with pulse oximetry 96% today. Suspect associated with fatigue and weight gain. No chest pains or peripheral edema. Check labs and follow pending reports. - EKG 12-Lead - CBC with Differential/Platelet - Comprehensive metabolic panel - TSH  3. Crohn's disease of colon with complication ([place]) Had surgery with colectomy and now has ileostomy in the RLQ of  abdomen. Will check labs for malabsorption syndrome. - CBC with Differential/Platelet - Comprehensive metabolic panel - E36 and Folate Panel

## 2017-07-29 ENCOUNTER — Telehealth: Payer: Self-pay

## 2017-07-29 NOTE — Telephone Encounter (Signed)
LMTCB

## 2017-07-29 NOTE — Telephone Encounter (Signed)
-----   Message from [place], [place] sent at 07/26/2017  5:35 PM EDT ----- No sign of anemia (normal hemoglobin, folate and Vitamin B12 levels). Blood sugar was elevated - ask lab to run Hgb A1C if the can use this blood specimen (if not, ask the patient to come by for a finger stick test). Normal thyroid test. Fatigue could be related to the Seroquel dosage change.

## 2017-07-30 LAB — CBC WITH DIFFERENTIAL/PLATELET
BASOS ABS: 71 {cells}/uL (ref 0–200)
BASOS PCT: 0.6 %
EOS PCT: 0.8 %
Eosinophils Absolute: 95 cells/uL (ref 15–500)
HCT: 47.1 % (ref 38.5–50.0)
HEMOGLOBIN: 16.2 g/dL (ref 13.2–17.1)
Lymphs Abs: 1416 cells/uL (ref 850–3900)
MCH: 30 pg (ref 27.0–33.0)
MCHC: 34.4 g/dL (ref 32.0–36.0)
MCV: 87.2 fL (ref 80.0–100.0)
MPV: 10.7 fL (ref 7.5–12.5)
Monocytes Relative: 7.3 %
NEUTROS ABS: 9449 {cells}/uL — AB (ref 1500–7800)
Neutrophils Relative %: 79.4 %
Platelets: 316 10*3/uL (ref 140–400)
RBC: 5.4 10*6/uL (ref 4.20–5.80)
RDW: 13.6 % (ref 11.0–15.0)
Total Lymphocyte: 11.9 %
WBC mixed population: 869 cells/uL (ref 200–950)
WBC: 11.9 10*3/uL — AB (ref 3.8–10.8)

## 2017-07-30 LAB — COMPLETE METABOLIC PANEL WITH GFR
AG Ratio: 1.2 (calc) (ref 1.0–2.5)
ALBUMIN MSPROF: 4.3 g/dL (ref 3.6–5.1)
ALKALINE PHOSPHATASE (APISO): 113 U/L (ref 40–115)
ALT: 42 U/L (ref 9–46)
AST: 31 U/L (ref 10–35)
BILIRUBIN TOTAL: 0.9 mg/dL (ref 0.2–1.2)
BUN: 16 mg/dL (ref 7–25)
CO2: 23 mmol/L (ref 20–32)
Calcium: 9.8 mg/dL (ref 8.6–10.3)
Chloride: 101 mmol/L (ref 98–110)
Creat: 1.28 mg/dL (ref 0.70–1.33)
GFR, EST AFRICAN AMERICAN: 75 mL/min/{1.73_m2} (ref >=60)
GFR, Est Non African American: 64 mL/min/{1.73_m2} (ref >=60)
GLOBULIN: 3.6 g/dL (ref 1.9–3.7)
GLUCOSE: 138 mg/dL — AB (ref 65–99)
Potassium: 4.5 mmol/L (ref 3.5–5.3)
SODIUM: 135 mmol/L (ref 135–146)
Total Protein: 7.9 g/dL (ref 6.1–8.1)

## 2017-07-30 LAB — B12 AND FOLATE PANEL
Folate: 10.4 ng/mL
Vitamin B-12: 283 pg/mL (ref 200–1100)

## 2017-07-30 LAB — TEST AUTHORIZATION

## 2017-07-30 LAB — HEMOGLOBIN A1C W/OUT EAG: Hgb A1c MFr Bld: 6.2 % of total Hgb — ABNORMAL HIGH (ref <5.7)

## 2017-07-30 LAB — TSH: TSH: 1.19 m[IU]/L (ref 0.40–4.50)

## 2017-07-30 NOTE — Telephone Encounter (Signed)
-----   Message from [employer], [place] sent at 07/30/2017  8:15 AM EST ----- Hgb A1C in pre diabetic range and blood sugar elevated. Must lower caloric intake to 1600-1800 and exercise for 30 minutes 3-4 days a week. Recheck levels in 3 months to be sure diabetes medication is not needed.

## 2017-08-01 NOTE — Telephone Encounter (Signed)
Patient advised. 3 month follow up scheduled.  

## 2017-08-08 ENCOUNTER — Ambulatory Visit: Payer: Self-pay

## 2017-08-19 ENCOUNTER — Ambulatory Visit (INDEPENDENT_AMBULATORY_CARE_PROVIDER_SITE_OTHER): Payer: Medicaid Other | Admitting: Family Medicine

## 2017-08-19 DIAGNOSIS — Z23 Encounter for immunization: Secondary | ICD-10-CM

## 2017-10-31 ENCOUNTER — Encounter: Payer: Self-pay | Admitting: Family Medicine

## 2017-10-31 ENCOUNTER — Ambulatory Visit (INDEPENDENT_AMBULATORY_CARE_PROVIDER_SITE_OTHER): Payer: Medicaid Other | Admitting: Family Medicine

## 2017-10-31 ENCOUNTER — Other Ambulatory Visit: Payer: Self-pay

## 2017-10-31 VITALS — BP 132/88 | HR 108 | Temp 98.5°F | Wt 227.2 lb

## 2017-10-31 DIAGNOSIS — R739 Hyperglycemia, unspecified: Secondary | ICD-10-CM

## 2017-10-31 DIAGNOSIS — F3130 Bipolar disorder, current episode depressed, mild or moderate severity, unspecified: Secondary | ICD-10-CM

## 2017-10-31 LAB — POCT GLYCOSYLATED HEMOGLOBIN (HGB A1C): Hemoglobin A1C: 6.5

## 2017-10-31 MED ORDER — METFORMIN HCL 500 MG PO TABS: 500.0000 mg | ORAL_TABLET | Freq: Two times a day (BID) | ORAL | 3 refills | Status: DC

## 2017-10-31 NOTE — Progress Notes (Signed)
Patient: Joshua Mays Male    DOB: Jul 12, 1966   52 y.o.   MRN: [id number] Visit Date: 10/31/2017  Today's Provider: Vernie Murders, PA   Chief Complaint  Patient presents with  . elevated blood sugar & A1C   Subjective:    HPI Elevated blood sugar & Hgb A1C:   Patient presents for a 3 month follow up. Labs done on 07/25/17 showed elevated blood glucose at 138 and elevated A1C at 6.2%. Patient advised to lower caloric intake to 1600-1800, exercise for 30 minutes 3-4 days a week, recheck labs in 3 months to be sure diabetes medication is not needed. He reports good compliance with recommendations. He reports he is still feeling extremely fatigue, short of breathe on exertion, and having leg weakness.   Lab Results  Component Value Date   HGBA1C 6.5 10/31/2017   Past Medical History:  Diagnosis Date  . Allergy   . Bipolar 1 disorder ([place])   . Depression   . Dysrhythmia    "FLUTTERING"  . GERD (gastroesophageal reflux disease)    NO MEDS-WELL-CONTROLLED  . Gout   . History of substance abuse   . Hypertension 2011   OFF MEDS SINCE 2014-EATING BETTER AND LOST WEIGHT-BP BETTER PER PT  . 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 Ulcerative colitis ([place]) 1990   Patient Active Problem List   Diagnosis Date Noted  . Long term current use of opiate analgesic 05/20/2017  . Long term prescription opiate use 05/20/2017  . Opiate use 05/20/2017  . Chronic pain syndrome 05/20/2017  . Chronic hand pain (Primary Area of Pain)( Right) 05/20/2017  . Chronic upper extremity pain (Secondary Area of Pain)(right) 05/20/2017  . Paresthesia of right upper extremity 05/20/2017  . 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 H/O ventral hernia repair 12/13/2015  . History of ileostomy 12/13/2015  . 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 Peristomal hernia 12/09/2015  . Postoperative wound infection 07/16/2015  . Bipolar I disorder, most recent episode depressed  ([place]) 07/01/2015  . Borderline personality disorder ([place]) 07/01/2015  . Alcohol abuse 07/01/2015  . Wound infection after surgery 06/25/2015  . Post-operative wound abscess 06/23/2015  . Ventral hernia 06/13/2015  . 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 Hernia, umbilical 64/40/3474  . Affective bipolar disorder ([place]) 05/18/2015  . Chronic pain 05/18/2015  . CC (Crohn's colitis) ([place]) 05/18/2015  . Clinical depression 05/18/2015  . Failure of erection 05/18/2015  . 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 Personal history of urinary disorder 05/18/2015  . H/O malignant neoplasm 05/18/2015  . H/O: substance abuse 05/18/2015  . Personal history of arthritis 05/18/2015  . BP (high blood pressure) 05/18/2015  . 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 Colitis gravis ([place]) 05/18/2015  . B12 deficiency 05/18/2015  . 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 APPENDECTOMY  1990   TAKEN OUT WITH COLECTOMY  . APPLICATION OF WOUND VAC N/A 07/01/2015   Procedure: APPLICATION OF WOUND VAC;  Surgeon: Robert Bellow, MD;  Location: [facility];  Service: General;  Laterality: N/A;  . COLECTOMY  1990  . HAND SURGERY  2011  . ILEOSTOMY  1990  . MINOR APPLICATION OF WOUND VAC N/A 06/29/2015   Procedure: MINOR APPLICATION OF WOUND VAC;  Surgeon:  Robert Bellow, MD;  Location: [facility];  Service: General;  Laterality: N/A;  . MINOR APPLICATION OF WOUND VAC N/A 07/01/2015   Procedure: WOUND VAC CHANGE;  Surgeon: Robert Bellow, MD;  Location: [facility];  Service: General;  Laterality: N/A;  . OTHER SURGICAL HISTORY     MULTIPLE ABDOMINAL SURGERIES  . SHOULDER SURGERY  2001  . TOTAL COLECTOMY  1990  . UMBILICAL HERNIA REPAIR  2001  . VENTRAL HERNIA REPAIR N/A 06/13/2015   Procedure: HERNIA REPAIR VENTRAL  ADULT;  Surgeon: Robert Bellow, MD;  Location: [facility];  Service: General;  Laterality: N/A;  . WOUND DEBRIDEMENT N/A 06/29/2015   Procedure: DEBRIDEMENT ABDOMINAL WOUND;  Surgeon: Robert Bellow, MD;  Location: [facility];  Service: General;  Laterality: N/A;  . WOUND DEBRIDEMENT N/A 07/05/2015   Procedure: Delayed primary closure of abdominal wound ;  Surgeon: Robert Bellow, MD;  Location: [facility];  Service: General;  Laterality: N/A;   Allergies  Allergen Reactions  . Cefaclor Rash and Hives  . Ciprofloxacin Rash and Hives    Current Outpatient Medications:  .  ibuprofen (ADVIL,MOTRIN) 200 MG tablet, Take 200 mg by mouth every 4 (four) hours as needed for mild pain or moderate pain., Disp: , Rfl:  .  QUEtiapine (SEROQUEL) 400 MG tablet, Take 1 tablet (400 mg total) by mouth at bedtime., Disp: 14 tablet, Rfl: 0 .  sildenafil (VIAGRA) 100 MG tablet, TAKE 1 TABLET BY MOUTH 1 TO 4 HOURS PRIOR TO INTERCOURSE, Disp: 10 tablet, Rfl: 1  Review of Systems  Constitutional: Positive for fatigue.  Respiratory: Positive for shortness of breath.   Cardiovascular: Negative.   Endocrine: Negative.   Neurological: Positive for weakness.   Social History   Tobacco Use  . Smoking status: Current Some Day Smoker    Packs/day: 0.50    Years: 16.00    Pack years: 8.00    Types: Cigarettes  . Smokeless tobacco: Never Used  . Tobacco comment: has been quit "couple of months"  Substance Use Topics  . Alcohol use: No    Alcohol/week: 0.0 oz   Objective:   BP 132/88 (BP Location: Left Arm, Patient Position: Sitting, Cuff Size: Normal)   Pulse (!) 108   Temp 98.5 F (36.9 C) (Oral)   Wt 227 lb 3.2 oz (103.1 kg)   SpO2 97%   BMI 31.69 kg/m    Physical Exam  Constitutional: He is oriented to person, place, and time. He appears well-developed and well-nourished. No distress.  HENT:  Head: Normocephalic and atraumatic.  Right Ear: Hearing and external ear normal.  Left Ear: Hearing  and external ear normal.  Nose: Nose normal.  Mouth/Throat: Oropharynx is clear and moist.  Eyes: Conjunctivae and lids are normal. Right eye exhibits no discharge. Left eye exhibits no discharge. No scleral icterus.  Neck: Neck supple.  Cardiovascular:  Mild tachycardia.  Pulmonary/Chest: Effort normal. No respiratory distress.  Abdominal: Soft.  RLQ ileostomy - stable and patent.  Musculoskeletal: Normal range of motion.  Lymphadenopathy:    He has no cervical adenopathy.  Neurological: He is alert and oriented to person, place, and time.  Skin: Skin is intact. No lesion and no rash noted.  Psychiatric: His speech is normal and behavior is normal. Thought content normal. His mood appears anxious. He exhibits a depressed mood. He expresses no suicidal plans.      Assessment & Plan:     1. Elevated blood sugar Denies polyuria, polydipsia,  polyphagia or blurred vision. Hgb A1C 6.5% today. Recommend he work on starting 1600-1800 cal diet and given Metformin 500 mg qd. Check routine labs and follow up pending reports. - POCT HgB A1C - CBC with Differential/Platelet - Comprehensive metabolic panel - TSH - metFORMIN (GLUCOPHAGE) 500 MG tablet; Take 1 tablet (500 mg total) by mouth 2 (two) times daily with a meal.  Dispense: 180 tablet; Refill: 3  2. Bipolar I disorder, most recent episode depressed ([place]) Anxious and a little more depressed today (spontaneous crying spell during interview). Continues to take Seroquel 400 mg at bedtime. Encouraged to follow up with psychiatrist regularly. Tries to do some odd jobs but continues to have fatigue issues. Recheck CBC,TSH and CMP. - CBC with Differential/Platelet - Comprehensive metabolic panel - TSH       Vernie Murders, PA  [facility]

## 2017-11-05 ENCOUNTER — Other Ambulatory Visit: Payer: Self-pay

## 2017-11-05 DIAGNOSIS — R739 Hyperglycemia, unspecified: Secondary | ICD-10-CM

## 2017-11-05 LAB — CBC WITH DIFFERENTIAL/PLATELET
Basophils Absolute: 0 10*3/uL (ref 0.0–0.2)
Basos: 0 %
EOS (ABSOLUTE): 0.2 10*3/uL (ref 0.0–0.4)
EOS: 1 %
HEMATOCRIT: 45.2 % (ref 37.5–51.0)
HEMOGLOBIN: 15.2 g/dL (ref 13.0–17.7)
IMMATURE GRANULOCYTES: 1 %
Immature Grans (Abs): 0.1 10*3/uL (ref 0.0–0.1)
LYMPHS ABS: 1.8 10*3/uL (ref 0.7–3.1)
Lymphs: 14 %
MCH: 30.5 pg (ref 26.6–33.0)
MCHC: 33.6 g/dL (ref 31.5–35.7)
MCV: 91 fL (ref 79–97)
MONOCYTES: 7 %
Monocytes Absolute: 0.9 10*3/uL (ref 0.1–0.9)
NEUTROS PCT: 77 %
Neutrophils Absolute: 10.1 10*3/uL — ABNORMAL HIGH (ref 1.4–7.0)
Platelets: 261 10*3/uL (ref 150–379)
RBC: 4.98 x10E6/uL (ref 4.14–5.80)
RDW: 14.4 % (ref 12.3–15.4)
WBC: 12.9 10*3/uL — AB (ref 3.4–10.8)

## 2017-11-05 LAB — COMPREHENSIVE METABOLIC PANEL
ALBUMIN: 4.1 g/dL (ref 3.5–5.5)
ALT: 60 IU/L — ABNORMAL HIGH (ref 0–44)
AST: 38 IU/L (ref 0–40)
Albumin/Globulin Ratio: 1.2 (ref 1.2–2.2)
Alkaline Phosphatase: 116 IU/L (ref 39–117)
BUN / CREAT RATIO: 7 — AB (ref 9–20)
BUN: 9 mg/dL (ref 6–24)
Bilirubin Total: 0.5 mg/dL (ref 0.0–1.2)
CALCIUM: 9.7 mg/dL (ref 8.7–10.2)
CO2: 23 mmol/L (ref 20–29)
CREATININE: 1.21 mg/dL (ref 0.76–1.27)
Chloride: 97 mmol/L (ref 96–106)
GFR calc Af Amer: 79 mL/min/{1.73_m2} (ref >59)
GFR, EST NON AFRICAN AMERICAN: 68 mL/min/{1.73_m2} (ref >59)
GLOBULIN, TOTAL: 3.4 g/dL (ref 1.5–4.5)
Glucose: 138 mg/dL — ABNORMAL HIGH (ref 65–99)
Potassium: 4.4 mmol/L (ref 3.5–5.2)
SODIUM: 138 mmol/L (ref 134–144)
TOTAL PROTEIN: 7.5 g/dL (ref 6.0–8.5)

## 2017-11-05 LAB — TSH: TSH: 1.56 u[IU]/mL (ref 0.450–4.500)

## 2017-11-21 ENCOUNTER — Ambulatory Visit (INDEPENDENT_AMBULATORY_CARE_PROVIDER_SITE_OTHER): Payer: Medicaid Other | Admitting: Family Medicine

## 2017-11-21 ENCOUNTER — Encounter: Payer: Self-pay | Admitting: Family Medicine

## 2017-11-21 VITALS — BP 132/90 | HR 95 | Temp 98.1°F | Wt 227.8 lb

## 2017-11-21 DIAGNOSIS — E119 Type 2 diabetes mellitus without complications: Secondary | ICD-10-CM

## 2017-11-21 MED ORDER — BLOOD GLUCOSE MONITOR KIT: PACK | 0 refills | Status: AC

## 2017-11-21 NOTE — Progress Notes (Signed)
Patient: Joshua Mays Male    DOB: [birth year]   52 y.o.   MRN: [id number] Visit Date: 11/21/2017  Today's Provider: Vernie Murders, PA   Chief Complaint  Patient presents with  . Blood sugar elevated   Subjective:    HPI Elevated Blood Sugar:  Patient presents for a 2 week follow up. Last OV was on 10/31/17. Patient advised to work on a 1600-1800 calorie diet and started Metformin 500 mg daily. He reports good compliance with treatment plan. Patient denies any side effects.   Lab Results  Component Value Date   HGBA1C 6.5 10/31/2017   Patient Active Problem List   Diagnosis Date Noted  . Long term current use of opiate analgesic 05/20/2017  . Long term prescription opiate use 05/20/2017  . Opiate use 05/20/2017  . Chronic pain syndrome 05/20/2017  . Chronic hand pain (Primary Area of Pain)( Right) 05/20/2017  . Chronic upper extremity pain (Secondary Area of Pain)(right) 05/20/2017  . Paresthesia of right upper extremity 05/20/2017  . 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 H/O ventral hernia repair 12/13/2015  . History of ileostomy 12/13/2015  . 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 Peristomal hernia 12/09/2015  . Postoperative wound infection 07/16/2015  . Bipolar I disorder, most recent episode depressed ([place]) 07/01/2015  . Borderline personality disorder ([place]) 07/01/2015  . Alcohol abuse 07/01/2015  . Wound infection after surgery 06/25/2015  . Post-operative wound abscess 06/23/2015  . Ventral hernia 06/13/2015  . 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 Hernia, umbilical 07/61/5183  . Affective bipolar disorder ([place]) 05/18/2015  . Chronic pain 05/18/2015  . CC (Crohn's colitis) ([place]) 05/18/2015  . Clinical depression 05/18/2015  . Failure of erection 05/18/2015  . 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 Personal history of urinary disorder 05/18/2015  . H/O malignant neoplasm 05/18/2015  . H/O: substance abuse 05/18/2015  . Personal history of  arthritis 05/18/2015  . BP (high blood pressure) 05/18/2015  . 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 Colitis gravis ([place]) 05/18/2015  . B12 deficiency 05/18/2015  . 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 APPENDECTOMY  1990   TAKEN OUT WITH COLECTOMY  . APPLICATION OF WOUND VAC N/A 07/01/2015   Procedure: APPLICATION OF WOUND VAC;  Surgeon: Robert Bellow, MD;  Location: [facility];  Service: General;  Laterality: N/A;  . COLECTOMY  1990  . HAND SURGERY  2011  . ILEOSTOMY  1990  . MINOR APPLICATION OF WOUND VAC N/A 06/29/2015   Procedure: MINOR APPLICATION OF WOUND VAC;  Surgeon: Robert Bellow, MD;  Location: [facility];  Service: General;  Laterality: N/A;  . MINOR APPLICATION OF WOUND VAC N/A 07/01/2015   Procedure: WOUND VAC CHANGE;  Surgeon: Robert Bellow, MD;  Location: [facility];  Service: General;  Laterality: N/A;  . OTHER SURGICAL HISTORY     MULTIPLE ABDOMINAL SURGERIES  . SHOULDER SURGERY  2001  . TOTAL COLECTOMY  1990  . UMBILICAL HERNIA REPAIR  2001  . VENTRAL HERNIA REPAIR N/A 06/13/2015   Procedure: HERNIA REPAIR VENTRAL ADULT;  Surgeon: Robert Bellow, MD;  Location: [facility];  Service: General;  Laterality: N/A;  . WOUND DEBRIDEMENT N/A 06/29/2015   Procedure: DEBRIDEMENT ABDOMINAL WOUND;  Surgeon: Robert Bellow, MD;  Location: [facility];  Service:  General;  Laterality: N/A;  . WOUND DEBRIDEMENT N/A 07/05/2015   Procedure: Delayed primary closure of abdominal wound ;  Surgeon: Robert Bellow, MD;  Location: [facility];  Service: General;  Laterality: N/A;   Family History  Problem Relation Age of Onset  . Hyperlipidemia Mother   . Diabetes Father   . Vascular Disease Father   . Alcohol abuse Father   . Depression Father   . Hyperlipidemia Brother   . CVA  Maternal Grandfather    Allergies  Allergen Reactions  . Cefaclor Rash and Hives  . Ciprofloxacin Rash and Hives    Current Outpatient Medications:  .  ibuprofen (ADVIL,MOTRIN) 200 MG tablet, Take 200 mg by mouth every 4 (four) hours as needed for mild pain or moderate pain., Disp: , Rfl:  .  metFORMIN (GLUCOPHAGE) 500 MG tablet, Take 1 tablet (500 mg total) by mouth 2 (two) times daily with a meal., Disp: 180 tablet, Rfl: 3 .  QUEtiapine (SEROQUEL) 400 MG tablet, Take 1 tablet (400 mg total) by mouth at bedtime., Disp: 14 tablet, Rfl: 0 .  sildenafil (VIAGRA) 100 MG tablet, TAKE 1 TABLET BY MOUTH 1 TO 4 HOURS PRIOR TO INTERCOURSE, Disp: 10 tablet, Rfl: 1 Review of Systems  Constitutional: Negative.   Respiratory: Negative.   Cardiovascular: Negative.    Social History   Tobacco Use  . Smoking status: Current Some Day Smoker    Packs/day: 0.50    Years: 16.00    Pack years: 8.00    Types: Cigarettes  . Smokeless tobacco: Never Used  . Tobacco comment: has been quit "couple of months"  Substance Use Topics  . Alcohol use: No    Alcohol/week: 0.0 oz   Objective:   BP 132/90 (BP Location: Right Arm, Patient Position: Sitting, Cuff Size: Normal)   Pulse 95   Temp 98.1 F (36.7 C) (Oral)   Wt 227 lb 12.8 oz (103.3 kg)   SpO2 96%   BMI 31.77 kg/m   Physical Exam  Constitutional: He is oriented to person, place, and time. He appears well-developed and well-nourished. No distress.  HENT:  Head: Normocephalic and atraumatic.  Right Ear: Hearing normal.  Left Ear: Hearing normal.  Nose: Nose normal.  Eyes: Conjunctivae and lids are normal. Right eye exhibits no discharge. Left eye exhibits no discharge. No scleral icterus.  Neck: Neck supple.  Cardiovascular: Normal rate.  Pulmonary/Chest: Effort normal and breath sounds normal. No respiratory distress.  Abdominal: Soft. Bowel sounds are normal.  Musculoskeletal: Normal range of motion.  Neurological: He is alert and  oriented to person, place, and time.  Skin: Skin is intact. No lesion and no rash noted.  Psychiatric: He has a normal mood and affect. His speech is normal and behavior is normal. Thought content normal.       Assessment & Plan:     1. Diabetes mellitus without complication (H[facility]) Tolerating Metformin 500 mg qd and trying to follow low fat 1600 cal diabetic diet. Scheduled for education through [place]. Given prescription for glucometer and supplies to test FBS daily with instructions to test if any symptoms of hypoglycemia. Recheck Hgb A1C in 2 months. Encouraged to lose weight and exercise regularly. - blood glucose meter kit and supplies KIT; Dispense based on patient and insurance preference. Test fasting sugar once a day and if any symptoms of hypoglycemia as directed. (FOR ICD-9 250.00, 250.01).  Dispense: 1 each; Refill: 0       Vernie Murders, PA  [facility]

## 2017-11-28 ENCOUNTER — Telehealth: Payer: Self-pay | Admitting: *Deleted

## 2017-11-28 DIAGNOSIS — F32A Depression, unspecified: Secondary | ICD-10-CM

## 2017-11-28 DIAGNOSIS — F329 Major depressive disorder, single episode, unspecified: Secondary | ICD-10-CM

## 2017-11-28 NOTE — Telephone Encounter (Signed)
Patient called office requesting a referral to a different psychiatrist. Patient states he is very unhappy with current psychiatrist. Patient states they do not treat him right at that office.

## 2017-11-28 NOTE — Telephone Encounter (Signed)
Please advise. Thanks.  

## 2017-12-10 NOTE — Telephone Encounter (Signed)
[facility] psychiatrist.

## 2017-12-11 NOTE — Telephone Encounter (Signed)
Joshua Mays, I started to put this order in, then realized he is actually your patient.  I will be glad to do the order but wanted to know if you have someone in mind or someone you guys may have talked about.  Also is it for dx depression.   Thanks [employer]

## 2017-12-12 NOTE — Telephone Encounter (Signed)
I have seen him several times instead of Dr. Rosanna Randy. I would go with his recommendation to get in the [facility] network for his Bipolar 1 Disorder since finances are a problem for him.

## 2017-12-23 ENCOUNTER — Ambulatory Visit (INDEPENDENT_AMBULATORY_CARE_PROVIDER_SITE_OTHER): Payer: Medicaid Other | Admitting: Family Medicine

## 2017-12-23 ENCOUNTER — Encounter: Payer: Self-pay | Admitting: Family Medicine

## 2017-12-23 VITALS — BP 138/98 | HR 79 | Temp 98.0°F | Wt 224.2 lb

## 2017-12-23 DIAGNOSIS — J40 Bronchitis, not specified as acute or chronic: Secondary | ICD-10-CM | POA: Diagnosis not present

## 2017-12-23 DIAGNOSIS — J0100 Acute maxillary sinusitis, unspecified: Secondary | ICD-10-CM

## 2017-12-23 MED ORDER — DM-GUAIFENESIN ER 30-600 MG PO TB12: 1.0000 | ORAL_TABLET | Freq: Two times a day (BID) | ORAL | 0 refills | Status: DC

## 2017-12-23 MED ORDER — CLARITHROMYCIN 500 MG PO TABS: 500.0000 mg | ORAL_TABLET | Freq: Two times a day (BID) | ORAL | 0 refills | Status: DC

## 2017-12-23 NOTE — Progress Notes (Signed)
Patient: Joshua Mays Male    DOB: 22-May-1966   52 y.o.   MRN: [id number] Visit Date: 12/23/2017  Today's Provider: Vernie Murders, PA   Chief Complaint  Patient presents with  . URI   Subjective:    URI   This is a new problem. Episode onset: 8 days ago. The problem has been gradually worsening. There has been no fever. Associated symptoms include congestion, coughing, ear pain, rhinorrhea and a sore throat. Treatments tried: OTC allergy and nasal spray. The treatment provided no relief.   Past Medical History:  Diagnosis Date  . Allergy   . Bipolar 1 disorder ([place])   . Depression   . Dysrhythmia    "FLUTTERING"  . GERD (gastroesophageal reflux disease)    NO MEDS-WELL-CONTROLLED  . Gout   . History of substance abuse   . Hypertension 2011   OFF MEDS SINCE 2014-EATING BETTER AND LOST WEIGHT-BP BETTER PER PT  . 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 Ulcerative colitis ([place]) 1990   Past Surgical History:  Procedure Laterality Date  . APPENDECTOMY  1990   TAKEN OUT WITH COLECTOMY  . APPLICATION OF WOUND VAC N/A 07/01/2015   Procedure: APPLICATION OF WOUND VAC;  Surgeon: Robert Bellow, MD;  Location: [facility] OR[facility];  Service: General;  Laterality: N/A;  . COLECTOMY  1990  . HAND SURGERY  2011  . ILEOSTOMY  1990  . MINOR APPLICATION OF WOUND VAC N/A 06/29/2015   Procedure: MINOR APPLICATION OF WOUND VAC;  Surgeon: Robert Bellow, MD;  Location: [facility];  Service: General;  Laterality: N/A;  . MINOR APPLICATION OF WOUND VAC N/A 07/01/2015   Procedure: WOUND VAC CHANGE;  Surgeon: Robert Bellow, MD;  Location: [facility];  Service: General;  Laterality: N/A;  . OTHER SURGICAL HISTORY     MULTIPLE ABDOMINAL SURGERIES  . SHOULDER SURGERY  2001  . TOTAL COLECTOMY  1990  . UMBILICAL HERNIA REPAIR  2001  . VENTRAL HERNIA REPAIR N/A 06/13/2015   Procedure: HERNIA REPAIR VENTRAL ADULT;  Surgeon: Robert Bellow, MD;  Location: [facility];   Service: General;  Laterality: N/A;  . WOUND DEBRIDEMENT N/A 06/29/2015   Procedure: DEBRIDEMENT ABDOMINAL WOUND;  Surgeon: Robert Bellow, MD;  Location: [facility];  Service: General;  Laterality: N/A;  . WOUND DEBRIDEMENT N/A 07/05/2015   Procedure: Delayed primary closure of abdominal wound ;  Surgeon: Robert Bellow, MD;  Location: [facility];  Service: General;  Laterality: N/A;   Family History  Problem Relation Age of Onset  . Hyperlipidemia Mother   . Diabetes Father   . Vascular Disease Father   . Alcohol abuse Father   . Depression Father   . Hyperlipidemia Brother   . CVA Maternal Grandfather    Allergies  Allergen Reactions  . Cefaclor Rash and Hives  . Ciprofloxacin Rash and Hives    Current Outpatient Medications:  .  blood glucose meter kit and supplies KIT, Dispense based on patient and insurance preference. Test fasting sugar once a day and if any symptoms of hypoglycemia as directed. (FOR ICD-9 250.00, 250.01)., Disp: 1 each, Rfl: 0 .  ibuprofen (ADVIL,MOTRIN) 200 MG tablet, Take 200 mg by mouth every 4 (four) hours as needed for mild pain or moderate pain., Disp: , Rfl:  .  metFORMIN (GLUCOPHAGE) 500 MG tablet, Take 1 tablet (500 mg total) by mouth 2 (two) times daily with a  meal., Disp: 180 tablet, Rfl: 3 .  QUEtiapine (SEROQUEL) 400 MG tablet, Take 1 tablet (400 mg total) by mouth at bedtime., Disp: 14 tablet, Rfl: 0 .  sildenafil (VIAGRA) 100 MG tablet, TAKE 1 TABLET BY MOUTH 1 TO 4 HOURS PRIOR TO INTERCOURSE, Disp: 10 tablet, Rfl: 1  Review of Systems  Constitutional: Negative.   HENT: Positive for congestion, ear pain, rhinorrhea and sore throat.   Respiratory: Positive for cough.   Cardiovascular: Negative.    Social History   Tobacco Use  . Smoking status: Current Some Day Smoker    Packs/day: 0.50    Years: 16.00    Pack years: 8.00    Types: Cigarettes  . Smokeless tobacco: Never Used  . Tobacco comment: has been quit "couple of months"   Substance Use Topics  . Alcohol use: No    Alcohol/week: 0.0 oz   Objective:   BP (!) 138/98 (BP Location: Right Arm, Patient Position: Sitting, Cuff Size: Normal)   Pulse 79   Temp 98 F (36.7 C) (Oral)   Wt 224 lb 3.2 oz (101.7 kg)   SpO2 97%   BMI 31.27 kg/m  BP Readings from Last 3 Encounters:  12/23/17 (!) 138/98  11/21/17 132/90  10/31/17 132/88   Physical Exam  Constitutional: He is oriented to person, place, and time. He appears well-developed and well-nourished. No distress.  HENT:  Head: Normocephalic and atraumatic.  Right Ear: Hearing and external ear normal.  Left Ear: Hearing and external ear normal.  Nose: Nose normal.  Slightly cobblestoning of posterior pharynx. Fiery red and swollen turbinates with purulent nasal drainage. No transillumination of maxillary sinuses with tenderness to palpation.  Eyes: Conjunctivae and lids are normal. Right eye exhibits no discharge. Left eye exhibits no discharge. No scleral icterus.  Neck: Neck supple.  Cardiovascular: Normal rate.  Pulmonary/Chest: Effort normal. No respiratory distress. He has no rales.  Rhonchi clears with cough.  Abdominal: Soft. Bowel sounds are normal.  Musculoskeletal: Normal range of motion.  Neurological: He is alert and oriented to person, place, and time.  Skin: Skin is intact. No lesion and no rash noted.  Psychiatric: He has a normal mood and affect. His speech is normal and behavior is normal. Thought content normal.      Assessment & Plan:     1. Acute maxillary sinusitis, recurrence not specified Onset with rhinorrhea and stuffy nose 8-9 days ago. Progressed to sinus pressure, tenderness, PND, sore throat and cough with purulent sputum and PND. Treat with antibiotic and expectorant with cough suppressant. May use Tylenol or Advil prn aches or pains. Recheck if no better in 5-7 days. - clarithromycin (BIAXIN) 500 MG tablet; Take 1 tablet (500 mg total) by mouth 2 (two) times daily.   Dispense: 20 tablet; Refill: 0 - dextromethorphan-guaiFENesin (MUCINEX DM) 30-600 MG 12hr tablet; Take 1 tablet by mouth 2 (two) times daily. For cough and congestion.  Dispense: 20 tablet; Refill: 0  2. Bronchitis Onset with congestion, cough and sinus pressure over the past 7-8 days. No fever or dyspnea. Increase fluid intake and treated with Biaxin and Mucinex-DM. Increase fluid intake and recheck prn. - clarithromycin (BIAXIN) 500 MG tablet; Take 1 tablet (500 mg total) by mouth 2 (two) times daily.  Dispense: 20 tablet; Refill: [place], PA  [facility]

## 2017-12-25 ENCOUNTER — Emergency Department
Admission: EM | Admit: 2017-12-25 | Discharge: 2017-12-25 | Disposition: A | Discharge status: Home / Self Care | Payer: Medicaid Other | Attending: Emergency Medicine | Admitting: Emergency Medicine

## 2017-12-25 ENCOUNTER — Encounter: Payer: Self-pay | Admitting: Emergency Medicine

## 2017-12-25 ENCOUNTER — Other Ambulatory Visit: Payer: Self-pay

## 2017-12-25 DIAGNOSIS — I10 Essential (primary) hypertension: Secondary | ICD-10-CM | POA: Diagnosis not present

## 2017-12-25 DIAGNOSIS — R112 Nausea with vomiting, unspecified: Secondary | ICD-10-CM | POA: Diagnosis present

## 2017-12-25 DIAGNOSIS — Z760 Encounter for issue of repeat prescription: Secondary | ICD-10-CM

## 2017-12-25 DIAGNOSIS — F17210 Nicotine dependence, cigarettes, uncomplicated: Secondary | ICD-10-CM | POA: Insufficient documentation

## 2017-12-25 MED ORDER — QUETIAPINE FUMARATE 400 MG PO TABS: 400.0000 mg | ORAL_TABLET | Freq: Every day | ORAL | 0 refills | Status: DC

## 2017-12-25 MED ORDER — ONDANSETRON 4 MG PO TBDP
4.0000 mg | ORAL_TABLET | Freq: Once | ORAL | Status: AC
  Administered 2017-12-25: 4 mg via ORAL
  Filled 2017-12-25: qty 1

## 2017-12-25 MED ORDER — QUETIAPINE FUMARATE 200 MG PO TABS
200.0000 mg | ORAL_TABLET | Freq: Once | ORAL | Status: AC
  Administered 2017-12-25: 200 mg via ORAL
  Filled 2017-12-25: qty 1

## 2017-12-25 MED ORDER — QUETIAPINE FUMARATE 200 MG PO TABS
400.0000 mg | ORAL_TABLET | Freq: Once | ORAL | Status: AC
  Administered 2017-12-25: 400 mg via ORAL
  Filled 2017-12-25: qty 2

## 2017-12-25 NOTE — ED Provider Notes (Signed)
[facility] Emergency Department Provider Note       Time seen: ----------------------------------------- 8:27 AM on 12/25/2017 -----------------------------------------   I have reviewed the triage vital signs and the nursing notes.  HISTORY   Chief Complaint Emesis and Nausea    HPI Joshua Mays is a 52 y.o. male with a history of allergies, bipolar disorder, depression, GERD, substance abuse, hypertension and ulcerative colitis who presents to the ED for nausea vomiting for 2 days.  Patient states he ran out of his Seroquel on Sunday.  His last dose was on Sunday.  He has been on Seroquel since 2003.  He arrives complaining of nausea and vomiting for the past 2 days and feeling restless.  He was seen by his primary care doctor on Monday and given an antibiotic prescription for his sinuses but he has not filled this either.  He denies fevers, chills or other complaints.  Past Medical History:  Diagnosis Date  . Allergy   . Bipolar 1 disorder ([place])   . Depression   . Dysrhythmia    "FLUTTERING"  . GERD (gastroesophageal reflux disease)    NO MEDS-WELL-CONTROLLED  . Gout   . History of substance abuse   . Hypertension 2011   OFF MEDS SINCE 2014-EATING BETTER AND LOST WEIGHT-BP BETTER PER PT  . 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 Ulcerative colitis ([place]) 1990    Patient Active Problem List   Diagnosis Date Noted  . Long term current use of opiate analgesic 05/20/2017  . Long term prescription opiate use 05/20/2017  . Opiate use 05/20/2017  . Chronic pain syndrome 05/20/2017  . Chronic hand pain (Primary Area of Pain)( Right) 05/20/2017  . Chronic upper extremity pain (Secondary Area of Pain)(right) 05/20/2017  . Paresthesia of right upper extremity 05/20/2017  . 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 H/O ventral hernia repair 12/13/2015  . History of ileostomy 12/13/2015  . 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 Peristomal hernia 12/09/2015  . Postoperative wound infection 07/16/2015  . Bipolar I disorder, most recent episode depressed ([place]) 07/01/2015  . Borderline personality disorder ([place]) 07/01/2015  . Alcohol abuse 07/01/2015  . Wound infection after surgery 06/25/2015  . Post-operative wound abscess 06/23/2015  . Ventral hernia 06/13/2015  . 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 Hernia, umbilical 20/94/7096  . Affective bipolar disorder ([place]) 05/18/2015  . Chronic pain 05/18/2015  . CC (Crohn's colitis) ([place]) 05/18/2015  . Clinical depression 05/18/2015  . Failure of erection 05/18/2015  . 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 Personal history of urinary disorder 05/18/2015  . H/O malignant neoplasm 05/18/2015  . H/O: substance abuse 05/18/2015  . Personal history of arthritis 05/18/2015  . BP (high blood pressure) 05/18/2015  . 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 Colitis gravis ([place]) 05/18/2015  . B12 deficiency 05/18/2015  . 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 APPENDECTOMY  1990   TAKEN OUT WITH COLECTOMY  . APPLICATION OF WOUND VAC N/A 07/01/2015   Procedure: APPLICATION OF WOUND VAC;  Surgeon: Robert Bellow, MD;  Location: [facility] ORS;  Service: General;  Laterality: N/A;  . COLECTOMY  1990  . HAND SURGERY  2011  . ILEOSTOMY  1990  . MINOR APPLICATION OF WOUND VAC N/A 06/29/2015  Procedure: MINOR APPLICATION OF WOUND VAC;  Surgeon: Robert Bellow, MD;  Location: [facility];  Service: General;  Laterality: N/A;  . MINOR APPLICATION OF WOUND VAC N/A 07/01/2015   Procedure: WOUND VAC CHANGE;  Surgeon: Robert Bellow, MD;  Location: [facility];  Service: General;  Laterality: N/A;  . OTHER SURGICAL HISTORY     MULTIPLE ABDOMINAL SURGERIES  . SHOULDER SURGERY   2001  . TOTAL COLECTOMY  1990  . UMBILICAL HERNIA REPAIR  2001  . VENTRAL HERNIA REPAIR N/A 06/13/2015   Procedure: HERNIA REPAIR VENTRAL ADULT;  Surgeon: Robert Bellow, MD;  Location: [facility];  Service: General;  Laterality: N/A;  . WOUND DEBRIDEMENT N/A 06/29/2015   Procedure: DEBRIDEMENT ABDOMINAL WOUND;  Surgeon: Robert Bellow, MD;  Location: [facility];  Service: General;  Laterality: N/A;  . WOUND DEBRIDEMENT N/A 07/05/2015   Procedure: Delayed primary closure of abdominal wound ;  Surgeon: Robert Bellow, MD;  Location: [facility];  Service: General;  Laterality: N/A;    Allergies Cefaclor and Ciprofloxacin  Social History Social History   Tobacco Use  . Smoking status: Current Some Day Smoker    Packs/day: 0.50    Years: 16.00    Pack years: 8.00    Types: Cigarettes  . Smokeless tobacco: Never Used  . Tobacco comment: has been quit "couple of months"  Substance Use Topics  . Alcohol use: No    Alcohol/week: 0.0 oz  . Drug use: No   Review of Systems Constitutional: Negative for fever. Cardiovascular: Negative for chest pain. Respiratory: Negative for shortness of breath. Gastrointestinal: Negative for abdominal pain, positive for nausea and vomiting Genitourinary: Negative for dysuria. Musculoskeletal: Negative for back pain. Skin: Negative for rash. Neurological: Negative for headaches, focal weakness or numbness. Psychiatric: Positive for anxiety and restlessness  All systems negative/normal/unremarkable except as stated in the HPI  ____________________________________________   PHYSICAL EXAM:  VITAL SIGNS: ED Triage Vitals  Enc Vitals Group     BP 12/25/17 0818 (!) 151/103     Pulse Rate 12/25/17 0818 91     Resp 12/25/17 0818 16     Temp 12/25/17 0818 98 F (36.7 C)     Temp Source 12/25/17 0818 Oral     SpO2 12/25/17 0818 99 %     Weight 12/25/17 0820 224 lb (101.6 kg)     Height --      Head Circumference --      Peak Flow --       Pain Score 12/25/17 0819 4     Pain Loc --      Pain Edu? --      Excl. in [place]? --    Constitutional: Alert and oriented.  Anxious, mild distress Eyes: Conjunctivae are normal. Normal extraocular movements. Cardiovascular: Normal rate, regular rhythm. No murmurs, rubs, or gallops. Respiratory: Normal respiratory effort without tachypnea nor retractions. Breath sounds are clear and equal bilaterally. No wheezes/rales/rhonchi. Musculoskeletal: Nontender with normal range of motion in extremities. No lower extremity tenderness nor edema. Neurologic:  Normal speech and language. No gross focal neurologic deficits are appreciated.  Skin:  Skin is warm, dry and intact. No rash noted. Psychiatric: Anxious mood and affect ____________________________________________  ED COURSE:  As part of my medical decision making, I reviewed the following data within the [place] History obtained from family if available, nursing notes, old chart and ekg, as well as notes from prior ED visits. Patient presented for symptoms of  anxiety with nausea and vomiting requesting a medication refill, I will give him a dose of his Seroquel and reevaluate.   Procedures ____________________________________________  DIFFERENTIAL DIAGNOSIS   Medication noncompliance, medication withdrawal, gastroenteritis  FINAL ASSESSMENT AND PLAN  Medication refill, nausea vomiting   Plan: The patient had presented for nausea vomiting.  Patient was given a dose of his Seroquel and will write a prescription for several days of the Seroquel.  He states he can get a refill of this on Monday.  He is cleared for outpatient follow-up at this time.   Laurence Aly, MD   Note: This note was generated in part or whole with voice recognition software. Voice recognition is usually quite accurate but there are transcription errors that can and very often do occur. I apologize for any typographical errors that were  not detected and corrected.     Earleen Newport, MD 12/25/17 [phone]

## 2017-12-25 NOTE — ED Triage Notes (Addendum)
Patient states he lost his Seroquel on Sunday.  Last does was Sunday. Arrives c/o nausea and vomiting x 2 days.  Also c/o restlessness.  States was seen by PCP on Monday and given an antibix RX for a sinus infection, patient has not filled RX.

## 2018-01-12 ENCOUNTER — Emergency Department
Admission: EM | Admit: 2018-01-12 | Discharge: 2018-01-12 | Disposition: A | Discharge status: Home / Self Care | Payer: Medicaid Other | Attending: Emergency Medicine | Admitting: Emergency Medicine

## 2018-01-12 ENCOUNTER — Other Ambulatory Visit: Payer: Self-pay

## 2018-01-12 ENCOUNTER — Encounter: Payer: Self-pay | Admitting: Emergency Medicine

## 2018-01-12 ENCOUNTER — Emergency Department: Payer: Medicaid Other

## 2018-01-12 DIAGNOSIS — M79672 Pain in left foot: Secondary | ICD-10-CM | POA: Insufficient documentation

## 2018-01-12 DIAGNOSIS — Z79899 Other long term (current) drug therapy: Secondary | ICD-10-CM | POA: Diagnosis not present

## 2018-01-12 DIAGNOSIS — Z7984 Long term (current) use of oral hypoglycemic drugs: Secondary | ICD-10-CM | POA: Diagnosis not present

## 2018-01-12 DIAGNOSIS — Z85828 Personal history of other malignant neoplasm of skin: Secondary | ICD-10-CM | POA: Diagnosis not present

## 2018-01-12 DIAGNOSIS — I10 Essential (primary) hypertension: Secondary | ICD-10-CM | POA: Diagnosis not present

## 2018-01-12 DIAGNOSIS — F17210 Nicotine dependence, cigarettes, uncomplicated: Secondary | ICD-10-CM | POA: Insufficient documentation

## 2018-01-12 MED ORDER — PREDNISONE 50 MG PO TABS: ORAL_TABLET | ORAL | 0 refills | Status: DC

## 2018-01-12 MED ORDER — OXYCODONE HCL 5 MG PO TABS
5.0000 mg | ORAL_TABLET | Freq: Once | ORAL | Status: AC
  Administered 2018-01-12: 5 mg via ORAL
  Filled 2018-01-12: qty 1

## 2018-01-12 MED ORDER — NAPROXEN 500 MG PO TABS
500.0000 mg | ORAL_TABLET | Freq: Once | ORAL | Status: DC
  Filled 2018-01-12 (×2): qty 1

## 2018-01-12 NOTE — ED Triage Notes (Signed)
Pt arrived via POV from home with reports of left foot pain. Pain started in great toe and moved to whole foot.   Pt states he has hx of gout and states the pain feels similar.  He is not currently on any gout medication.

## 2018-01-12 NOTE — ED Provider Notes (Signed)
[facility] Emergency Department Provider Note  ____________________________________________  Time seen: Approximately 1:12 PM  I have reviewed the triage vital signs and the nursing notes.   HISTORY  Chief Complaint Foot Pain   HPI Joshua Mays is a 52 y.o. male that presents to the emergency department for evaluation of left foot pain for 5 days.  Pain feels identical to gout.  He has not had gout for 2 years but had it in his foot last time in the same place as well.  No trauma.  No fever, chills, nausea, vomiting, numbness, tingling, wounds.   Past Medical History:  Diagnosis Date  . Allergy   . Bipolar 1 disorder ([place])   . Depression   . Dysrhythmia    "FLUTTERING"  . GERD (gastroesophageal reflux disease)    NO MEDS-WELL-CONTROLLED  . Gout   . History of substance abuse   . Hypertension 2011   OFF MEDS SINCE 2014-EATING BETTER AND LOST WEIGHT-BP BETTER PER PT  . 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 Ulcerative colitis ([place]) 1990    Patient Active Problem List   Diagnosis Date Noted  . Long term current use of opiate analgesic 05/20/2017  . Long term prescription opiate use 05/20/2017  . Opiate use 05/20/2017  . Chronic pain syndrome 05/20/2017  . Chronic hand pain (Primary Area of Pain)( Right) 05/20/2017  . Chronic upper extremity pain (Secondary Area of Pain)(right) 05/20/2017  . Paresthesia of right upper extremity 05/20/2017  . 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 H/O ventral hernia repair 12/13/2015  . History of ileostomy 12/13/2015  . 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 Peristomal hernia 12/09/2015  . Postoperative wound infection 07/16/2015  . Bipolar I disorder, most recent episode depressed ([place]) 07/01/2015  . Borderline personality disorder ([place]) 07/01/2015  . Alcohol abuse 07/01/2015  . Wound infection after surgery 06/25/2015  . Post-operative wound abscess 06/23/2015  . Ventral  hernia 06/13/2015  . 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 Hernia, umbilical 44/09/270  . Affective bipolar disorder ([place]) 05/18/2015  . Chronic pain 05/18/2015  . CC (Crohn's colitis) ([place]) 05/18/2015  . Clinical depression 05/18/2015  . Failure of erection 05/18/2015  . 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 Personal history of urinary disorder 05/18/2015  . H/O malignant neoplasm 05/18/2015  . H/O: substance abuse 05/18/2015  . Personal history of arthritis 05/18/2015  . BP (high blood pressure) 05/18/2015  . 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 Colitis gravis ([place]) 05/18/2015  . B12 deficiency 05/18/2015  . 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 APPENDECTOMY  1990   TAKEN OUT WITH COLECTOMY  . APPLICATION OF WOUND VAC N/A 07/01/2015   Procedure: APPLICATION OF WOUND VAC;  Surgeon: Robert Bellow, MD;  Location: [facility] ORS;  Service: General;  Laterality: N/A;  . COLECTOMY  1990  . HAND SURGERY  2011  . ILEOSTOMY  1990  . MINOR APPLICATION OF WOUND VAC N/A 06/29/2015   Procedure: MINOR APPLICATION OF WOUND VAC;  Surgeon: Robert Bellow, MD;  Location: [facility] ORS;  Service: General;  Laterality: N/A;  . MINOR APPLICATION OF WOUND VAC N/A 07/01/2015   Procedure: WOUND VAC CHANGE;  Surgeon: Robert Bellow, MD;  Location: [facility] ORS;  Service: General;  Laterality: N/A;  .  OTHER SURGICAL HISTORY     MULTIPLE ABDOMINAL SURGERIES  . SHOULDER SURGERY  2001  . TOTAL COLECTOMY  1990  . UMBILICAL HERNIA REPAIR  2001  . VENTRAL HERNIA REPAIR N/A 06/13/2015   Procedure: HERNIA REPAIR VENTRAL ADULT;  Surgeon: Robert Bellow, MD;  Location: [facility] ORS;  Service: General;  Laterality: N/A;  . WOUND DEBRIDEMENT N/A 06/29/2015   Procedure: DEBRIDEMENT ABDOMINAL WOUND;  Surgeon: Robert Bellow, MD;  Location: [facility] ORS;  Service: General;  Laterality: N/A;  . WOUND DEBRIDEMENT N/A 07/05/2015   Procedure: Delayed primary closure of abdominal wound ;  Surgeon: Robert Bellow, MD;  Location: [facility] ORS;  Service: General;  Laterality: N/A;    Prior to Admission medications   Medication Sig Start Date End Date Taking? Authorizing Provider  blood glucose meter kit and supplies KIT Dispense based on patient and insurance preference. Test fasting sugar once a day and if any symptoms of hypoglycemia as directed. (FOR ICD-9 250.00, 250.01). 11/21/17   Chrismon, Vickki Muff, PA  clarithromycin (BIAXIN) 500 MG tablet Take 1 tablet (500 mg total) by mouth 2 (two) times daily. 12/23/17   Chrismon, Vickki Muff, PA  dextromethorphan-guaiFENesin (MUCINEX DM) 30-600 MG 12hr tablet Take 1 tablet by mouth 2 (two) times daily. For cough and congestion. 12/23/17   Chrismon, Vickki Muff, PA  ibuprofen (ADVIL,MOTRIN) 200 MG tablet Take 200 mg by mouth every 4 (four) hours as needed for mild pain or moderate pain.    [provider]  metFORMIN (GLUCOPHAGE) 500 MG tablet Take 1 tablet (500 mg total) by mouth 2 (two) times daily with a meal. 10/31/17   Chrismon, Vickki Muff, PA  predniSONE (DELTASONE) 50 MG tablet Take 1 pill per day for 5 days 01/12/18   Laban Emperor, PA-C  QUEtiapine (SEROQUEL) 400 MG tablet Take 1 tablet (400 mg total) by mouth at bedtime. 12/25/17   Earleen Newport, MD  sildenafil (VIAGRA) 100 MG tablet TAKE 1 TABLET BY MOUTH 1 TO 4 HOURS PRIOR TO INTERCOURSE 02/01/17   Chrismon, Vickki Muff, PA    Allergies Cefaclor; Ciprofloxacin; and Naproxen  Family History  Problem Relation Age of Onset  . Hyperlipidemia Mother   . Diabetes Father   . Vascular Disease Father   . Alcohol abuse Father   . Depression Father   . Hyperlipidemia Brother   . CVA Maternal Grandfather     Social History Social History   Tobacco Use  . Smoking status: Current Some Day Smoker    Packs/day: 0.50     Years: 16.00    Pack years: 8.00    Types: Cigarettes  . Smokeless tobacco: Never Used  . Tobacco comment: has been quit "couple of months"  Substance Use Topics  . Alcohol use: No    Alcohol/week: 0.0 oz  . Drug use: No     Review of Systems  Constitutional: No fever/chills Gastrointestinal: No nausea, no vomiting.  Musculoskeletal: Positive for foot pain.  Skin: Negative for rash, abrasions, lacerations, ecchymosis. Neurological: Negative for headaches, numbness or tingling   ____________________________________________   PHYSICAL EXAM:  VITAL SIGNS: ED Triage Vitals  Enc Vitals Group     BP 01/12/18 1053 (!) 146/112     Pulse Rate 01/12/18 1053 (!) 106     Resp 01/12/18 1053 18     Temp 01/12/18 1053 97.8 F (36.6 C)     Temp Source 01/12/18 1053 Oral     SpO2 01/12/18 1053 99 %  Weight 01/12/18 1102 224 lb (101.6 kg)     Height 01/12/18 1102 '5\' 11"'  (1.803 m)     Head Circumference --      Peak Flow --      Pain Score 01/12/18 1248 10     Pain Loc --      Pain Edu? --      Excl. in [place]? --      Constitutional: Alert and oriented. Well appearing and in no acute distress. Eyes: Conjunctivae are normal. PERRL. EOMI. Head: Atraumatic. ENT:      Ears:      Nose: No congestion/rhinnorhea.      Mouth/Throat: Mucous membranes are moist.  Neck: No stridor. Cardiovascular: Normal rate, regular rhythm.  Good peripheral circulation. Respiratory: Normal respiratory effort without tachypnea or retractions. Lungs CTAB. Good air entry to the bases with no decreased or absent breath sounds. Musculoskeletal: Full range of motion to all extremities. No gross deformities appreciated.  No erythema or swelling.  Tenderness to palpation over dorsal first metatarsal.  Tenderness to palpation over plantar fascia. Able to move toes without difficulty. No wounds. Neurologic:  Normal speech and language. No gross focal neurologic deficits are appreciated.  Cap refill less than 3  seconds. Skin:  Skin is warm, dry and intact. No rash noted.  ____________________________________________   LABS (all labs ordered are listed, but only abnormal results are displayed)  Labs Reviewed - No data to display ____________________________________________  EKG   ____________________________________________  RADIOLOGY Robinette Haines, personally viewed and evaluated these images (plain radiographs) as part of my medical decision making, as well as reviewing the written report by the radiologist.  Dg Foot Complete Left  Result Date: 01/12/2018 CLINICAL DATA:  Pt arrived via POV from home with reports of left foot pain. Pain started in great toe and moved to whole foot. Pt states he has hx of gout and states the pain feels similar. He is not currently on any gout medication EXAM: LEFT FOOT - COMPLETE 3+ VIEW COMPARISON:  None. FINDINGS: There is no evidence of fracture or dislocation. There is no evidence of arthropathy or other focal bone abnormality. Soft tissues are unremarkable. IMPRESSION: Negative. Electronically Signed   By: Nolon Nations M.D.   On: 01/12/2018 14:19    ____________________________________________    PROCEDURES  Procedure(s) performed:    Procedures    Medications  naproxen (NAPROSYN) tablet 500 mg (500 mg Oral Not Given 01/12/18 1426)  oxyCODONE (Oxy IR/ROXICODONE) immediate release tablet 5 mg (5 mg Oral Given 01/12/18 1426)     ____________________________________________   INITIAL IMPRESSION / ASSESSMENT AND PLAN / ED COURSE  Pertinent labs & imaging results that were available during my care of the patient were reviewed by me and considered in my medical decision making (see chart for details).  Review of the [place] CSRS was performed in accordance of the [place] prior to dispensing any controlled drugs.     Patient presented to the emergency department for concerns of gout.  Vital signs and exam are reassuring.  X-ray negative for  acute bony abnormalities.  Patient is having pain over the top and bottom of his foot that started in his great toe.  He states that this pain feels identical to gout flare he has had in the past.  Foot was Ace wrapped and crutches were given.  He states he is unable to take naproxen and some other NSAIDs.  Patient will be discharged home with prescriptions for prednisone.  Patient  is to follow up with podiatry and PCP as directed. Patient is given ED precautions to return to the ED for any worsening or new symptoms.     ____________________________________________  FINAL CLINICAL IMPRESSION(S) / ED DIAGNOSES  Final diagnoses:  Foot pain, left      NEW MEDICATIONS STARTED DURING THIS VISIT:  ED Discharge Orders        Ordered    predniSONE (DELTASONE) 50 MG tablet     01/12/18 1454          This chart was dictated using voice recognition software/Dragon. Despite best efforts to proofread, errors can occur which can change the meaning. Any change was purely unintentional.    Laban Emperor, PA-C 01/12/18 1541    Arta Silence, MD 01/13/18 [phone]

## 2018-01-20 LAB — HM DIABETES EYE EXAM

## 2018-01-21 ENCOUNTER — Encounter: Payer: Self-pay | Admitting: Family Medicine

## 2018-01-21 ENCOUNTER — Ambulatory Visit (INDEPENDENT_AMBULATORY_CARE_PROVIDER_SITE_OTHER): Payer: Medicaid Other | Admitting: Family Medicine

## 2018-01-21 VITALS — BP 112/84 | HR 91 | Temp 97.8°F | Wt 226.2 lb

## 2018-01-21 DIAGNOSIS — E119 Type 2 diabetes mellitus without complications: Secondary | ICD-10-CM

## 2018-01-21 DIAGNOSIS — Z23 Encounter for immunization: Secondary | ICD-10-CM

## 2018-01-21 DIAGNOSIS — Z8739 Personal history of other diseases of the musculoskeletal system and connective tissue: Secondary | ICD-10-CM

## 2018-01-21 LAB — POCT GLYCOSYLATED HEMOGLOBIN (HGB A1C): Hemoglobin A1C: 6.4

## 2018-01-21 LAB — POCT UA - MICROALBUMIN: Microalbumin Ur, POC: 20 mg/L

## 2018-01-21 NOTE — Progress Notes (Signed)
Patient: Joshua Mays Male    DOB: 1966/07/20   52 y.o.   MRN: [id number] Visit Date: 01/21/2018  Today's Provider: Vernie Murders, PA   Chief Complaint  Patient presents with  . Diabetes  . Follow-up   Subjective:    HPI  Diabetes Mellitus Type II, Follow-up:   Lab Results  Component Value Date   HGBA1C 6.5 10/31/2017   HGBA1C 6.2 (H) 07/25/2017    Last seen for diabetes 2 months ago.  Management since then includes continue Metformin, 1600 calorie diet, work on weight loss, exercise, and check FBS daily. He reports poor compliance with treatment. He is not having side effects.  Current symptoms include none Home blood sugar records: unknown patient has not been checking FBS  Episodes of hypoglycemia? Unknown   Current Insulin Regimen: none Most Recent Eye Exam: not UTD Weight trend: stable Prior visit with dietician: no Current diet: limits fast food  Current exercise: walking   Pertinent Labs:    Component Value Date/Time   CHOL 159 07/29/2013   TRIG 207 (A) 07/29/2013   HDL 69 07/29/2013   LDLCALC 49 07/29/2013   CREATININE 1.21 11/04/2017 0823   CREATININE 1.28 07/25/2017 0942   -----------------------------------------------------------------------  Past Medical History:  Diagnosis Date  . Allergy   . Bipolar 1 disorder ([place])   . Depression   . Dysrhythmia    "FLUTTERING"  . GERD (gastroesophageal reflux disease)    NO MEDS-WELL-CONTROLLED  . Gout   . History of substance abuse   . Hypertension 2011   OFF MEDS SINCE 2014-EATING BETTER AND LOST WEIGHT-BP BETTER PER PT  . 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 Ulcerative colitis ([place]) 1990   Past Surgical History:  Procedure Laterality Date  . APPENDECTOMY  1990   TAKEN OUT WITH COLECTOMY  . APPLICATION OF WOUND VAC N/A 07/01/2015   Procedure: APPLICATION OF WOUND VAC;  Surgeon: Robert Bellow, MD;  Location: [facility] ORS;  Service: General;   Laterality: N/A;  . COLECTOMY  1990  . HAND SURGERY  2011  . ILEOSTOMY  1990  . MINOR APPLICATION OF WOUND VAC N/A 06/29/2015   Procedure: MINOR APPLICATION OF WOUND VAC;  Surgeon: Robert Bellow, MD;  Location: [facility] ORS;  Service: General;  Laterality: N/A;  . MINOR APPLICATION OF WOUND VAC N/A 07/01/2015   Procedure: WOUND VAC CHANGE;  Surgeon: Robert Bellow, MD;  Location: [facility] ORS;  Service: General;  Laterality: N/A;  . OTHER SURGICAL HISTORY     MULTIPLE ABDOMINAL SURGERIES  . SHOULDER SURGERY  2001  . TOTAL COLECTOMY  1990  . UMBILICAL HERNIA REPAIR  2001  . VENTRAL HERNIA REPAIR N/A 06/13/2015   Procedure: HERNIA REPAIR VENTRAL ADULT;  Surgeon: Robert Bellow, MD;  Location: [facility] ORS;  Service: General;  Laterality: N/A;  . WOUND DEBRIDEMENT N/A 06/29/2015   Procedure: DEBRIDEMENT ABDOMINAL WOUND;  Surgeon: Robert Bellow, MD;  Location: [facility] ORS;  Service: General;  Laterality: N/A;  . WOUND DEBRIDEMENT N/A 07/05/2015   Procedure: Delayed primary closure of abdominal wound ;  Surgeon: Robert Bellow, MD;  Location: [facility] ORS;  Service: General;  Laterality: N/A;   Family History  Problem Relation Age of Onset  . Hyperlipidemia Mother   . Diabetes Father   . Vascular Disease Father   . Alcohol abuse Father   . Depression Father   . Hyperlipidemia Brother   .  CVA Maternal Grandfather    Allergies  Allergen Reactions  . Cefaclor Rash and Hives  . Ciprofloxacin Rash and Hives  . Naproxen Other (See Comments)    Last time given had GI bleeding into his Ostomy bag     Current Outpatient Medications:  .  blood glucose meter kit and supplies KIT, Dispense based on patient and insurance preference. Test fasting sugar once a day and if any symptoms of hypoglycemia as directed. (FOR ICD-9 250.00, 250.01)., Disp: 1 each, Rfl: 0 .  ibuprofen (ADVIL,MOTRIN) 200 MG tablet, Take 200 mg by mouth every 4 (four) hours as needed for mild pain or moderate pain., Disp: , Rfl:   .  metFORMIN (GLUCOPHAGE) 500 MG tablet, Take 1 tablet (500 mg total) by mouth 2 (two) times daily with a meal., Disp: 180 tablet, Rfl: 3 .  QUEtiapine (SEROQUEL) 400 MG tablet, Take 1 tablet (400 mg total) by mouth at bedtime., Disp: 7 tablet, Rfl: 0 .  sildenafil (VIAGRA) 100 MG tablet, TAKE 1 TABLET BY MOUTH 1 TO 4 HOURS PRIOR TO INTERCOURSE, Disp: 10 tablet, Rfl: 1  Review of Systems  Constitutional: Negative.   Respiratory: Negative.   Cardiovascular: Negative.   Endocrine: Negative.    Social History   Tobacco Use  . Smoking status: Current Some Day Smoker    Packs/day: 0.50    Years: 16.00    Pack years: 8.00    Types: Cigarettes  . Smokeless tobacco: Never Used  . Tobacco comment: has been quit "couple of months"  Substance Use Topics  . Alcohol use: No    Alcohol/week: 0.0 oz   Objective:   BP 112/84 (BP Location: Right Arm, Patient Position: Sitting, Cuff Size: Normal)   Pulse 91   Temp 97.8 F (36.6 C) (Oral)   Wt 226 lb 3.2 oz (102.6 kg)   SpO2 97%   BMI 31.55 kg/m   Wt Readings from Last 3 Encounters:  01/21/18 226 lb 3.2 oz (102.6 kg)  01/12/18 224 lb (101.6 kg)  12/25/17 224 lb (101.6 kg)   Physical Exam  Constitutional: He is oriented to person, place, and time. He appears well-developed and well-nourished. No distress.  HENT:  Head: Normocephalic and atraumatic.  Right Ear: Hearing normal.  Left Ear: Hearing normal.  Nose: Nose normal.  Eyes: Conjunctivae and lids are normal. Right eye exhibits no discharge. Left eye exhibits no discharge. No scleral icterus.  Cardiovascular: Normal rate.  Pulmonary/Chest: Effort normal and breath sounds normal. No respiratory distress.  Abdominal: Soft.  Musculoskeletal: Normal range of motion.  Neurological: He is alert and oriented to person, place, and time.  Skin: Skin is intact. No lesion and no rash noted.  Psychiatric: He has a normal mood and affect. His speech is normal and behavior is normal. Thought  content normal.   Diabetic Foot Form - Detailed   Diabetic Foot Exam - detailed Diabetic Foot exam was performed with the following findings:  Yes 01/21/2018 10:56 AM  Visual Foot Exam completed.:  Yes  Can the patient see the bottom of their feet?:  Yes Are the shoes appropriate in style and fit?:  Yes Is there swelling or and abnormal foot shape?:  No Is there a claw toe deformity?:  No Is there elevated skin temparature?:  No Is there foot or ankle muscle weakness?:  No Normal Range of Motion:  Yes Pulse Foot Exam completed.:  Yes  Right posterior Tibialias:  Present Left posterior Tibialias:  Present  Right Dorsalis  Pedis:  Present Left Dorsalis Pedis:  Present  Sensory Foot Exam Completed.:  Yes Semmes-Weinstein Monofilament Test R Site 1-Great Toe:  Pos L Site 1-Great Toe:  Pos          Assessment & Plan:     1. Diabetes mellitus without complication (HCC) Feeling well without hypoglycemic episodes recently. Still taking the Metformin 500 mg BID and controlling diabetic diet. Needs to lose weight and become more physically active. Hgb A1C is 6.4% with microalbumen 20 mg/L today. Recheck labs and continue present regimen. Follow up pending lab reports. - POCT UA - Microalbumin - POCT HgB A1C - CBC with Differential/Platelet - Comprehensive metabolic panel - Lipid panel  2. History of gout Had a flare of gout that was treated with prednisone on 01-12-18 at the ER. Feeling much improved and no further redness or swelling in the left foot first MTP joint. Recheck CBC and uric acid level. Continue to restrict purines in diet. - CBC with Differential/Platelet - Uric acid  3. Need for Tdap vaccination - Tdap vaccine greater than or equal to 7yo IM  4. Need for Streptococcus pneumoniae vaccination - Pneumococcal polysaccharide vaccine 23-valent greater than or equal to 2yo subcutaneous/IM       Vernie Murders, PA  [facility]

## 2018-01-22 LAB — CBC WITH DIFFERENTIAL/PLATELET
BASOS: 0 %
Basophils Absolute: 0 10*3/uL (ref 0.0–0.2)
EOS (ABSOLUTE): 0.2 10*3/uL (ref 0.0–0.4)
Eos: 2 %
HEMATOCRIT: 42.2 % (ref 37.5–51.0)
HEMOGLOBIN: 14.5 g/dL (ref 13.0–17.7)
IMMATURE GRANULOCYTES: 2 %
Immature Grans (Abs): 0.1 10*3/uL (ref 0.0–0.1)
Lymphocytes Absolute: 1.5 10*3/uL (ref 0.7–3.1)
Lymphs: 16 %
MCH: 31 pg (ref 26.6–33.0)
MCHC: 34.4 g/dL (ref 31.5–35.7)
MCV: 90 fL (ref 79–97)
MONOCYTES: 7 %
MONOS ABS: 0.7 10*3/uL (ref 0.1–0.9)
NEUTROS PCT: 73 %
Neutrophils Absolute: 7.1 10*3/uL — ABNORMAL HIGH (ref 1.4–7.0)
Platelets: 299 10*3/uL (ref 150–379)
RBC: 4.67 x10E6/uL (ref 4.14–5.80)
RDW: 14.4 % (ref 12.3–15.4)
WBC: 9.6 10*3/uL (ref 3.4–10.8)

## 2018-01-22 LAB — COMPREHENSIVE METABOLIC PANEL
A/G RATIO: 1.3 (ref 1.2–2.2)
ALBUMIN: 3.9 g/dL (ref 3.5–5.5)
ALT: 26 IU/L (ref 0–44)
AST: 18 IU/L (ref 0–40)
Alkaline Phosphatase: 97 IU/L (ref 39–117)
BUN / CREAT RATIO: 11 (ref 9–20)
BUN: 12 mg/dL (ref 6–24)
Bilirubin Total: 0.6 mg/dL (ref 0.0–1.2)
CALCIUM: 8.8 mg/dL (ref 8.7–10.2)
CO2: 21 mmol/L (ref 20–29)
CREATININE: 1.05 mg/dL (ref 0.76–1.27)
Chloride: 104 mmol/L (ref 96–106)
GFR, EST AFRICAN AMERICAN: 94 mL/min/{1.73_m2} (ref >59)
GFR, EST NON AFRICAN AMERICAN: 81 mL/min/{1.73_m2} (ref >59)
GLOBULIN, TOTAL: 3 g/dL (ref 1.5–4.5)
Glucose: 101 mg/dL — ABNORMAL HIGH (ref 65–99)
POTASSIUM: 4.2 mmol/L (ref 3.5–5.2)
SODIUM: 141 mmol/L (ref 134–144)
TOTAL PROTEIN: 6.9 g/dL (ref 6.0–8.5)

## 2018-01-22 LAB — LIPID PANEL
Chol/HDL Ratio: 2.9 ratio (ref 0.0–5.0)
Cholesterol, Total: 193 mg/dL (ref 100–199)
HDL: 67 mg/dL (ref >39)
LDL CALC: 85 mg/dL (ref 0–99)
Triglycerides: 203 mg/dL — ABNORMAL HIGH (ref 0–149)
VLDL CHOLESTEROL CAL: 41 mg/dL — AB (ref 5–40)

## 2018-01-22 LAB — URIC ACID: Uric Acid: 7.1 mg/dL (ref 3.7–8.6)

## 2018-01-23 ENCOUNTER — Encounter: Payer: Self-pay | Admitting: Family Medicine

## 2018-01-23 ENCOUNTER — Telehealth: Payer: Self-pay

## 2018-01-23 NOTE — Telephone Encounter (Signed)
LMTCB

## 2018-01-23 NOTE — Telephone Encounter (Signed)
-----   Message from Margo Common, [place] sent at 01/23/2018  1:33 PM EDT ----- Blood sugar is much better. Triglycerides still elevated. Uric acid level a little above the target range for gout patients. Recommend purine restrictions to diet (can find the list of foods to avoid online). Recheck levels in 3 months.

## 2018-01-24 NOTE — Telephone Encounter (Signed)
Patient advised. Follow up appointment scheduled.  

## 2018-01-27 ENCOUNTER — Telehealth: Payer: Self-pay | Admitting: Family Medicine

## 2018-01-27 NOTE — Telephone Encounter (Signed)
Claiborne Billings with [facility] stated that she was returning Sarah's call about the referral that our office sent. Claiborne Billings stated they have closed the referral because they attempted to contact pt 3 times at [phone], left messages and pt hasn't returned any of the calls. Please advise. Thanks TNP

## 2018-01-29 NOTE — Telephone Encounter (Signed)
Pt returned call and states he is being seen at [facility]

## 2018-04-28 ENCOUNTER — Ambulatory Visit: Payer: Self-pay | Admitting: Family Medicine

## 2018-05-23 ENCOUNTER — Encounter: Payer: Self-pay | Admitting: Family Medicine

## 2018-05-23 ENCOUNTER — Ambulatory Visit (INDEPENDENT_AMBULATORY_CARE_PROVIDER_SITE_OTHER): Payer: Medicaid Other | Admitting: Family Medicine

## 2018-05-23 VITALS — BP 124/88 | HR 94 | Temp 97.9°F | Wt 220.0 lb

## 2018-05-23 DIAGNOSIS — E119 Type 2 diabetes mellitus without complications: Secondary | ICD-10-CM

## 2018-05-23 DIAGNOSIS — I10 Essential (primary) hypertension: Secondary | ICD-10-CM

## 2018-05-23 DIAGNOSIS — E790 Hyperuricemia without signs of inflammatory arthritis and tophaceous disease: Secondary | ICD-10-CM | POA: Diagnosis not present

## 2018-05-23 LAB — POCT GLYCOSYLATED HEMOGLOBIN (HGB A1C): HEMOGLOBIN A1C: 6.5 % — AB (ref 4.0–5.6)

## 2018-05-23 NOTE — Progress Notes (Signed)
Patient: Joshua Mays Male    DOB: [birth year]   52 y.o.   MRN: [id number] Visit Date: 05/23/2018  Today's Provider: Vernie Murders, PA   Chief Complaint  Patient presents with  . Hypertension  . Diabetes  . Gout    Pt is due to have Uric Acid rechecked.   Subjective:    Diabetes  He presents for his follow-up diabetic visit. He has type 2 diabetes mellitus. His disease course has been stable. Pertinent negatives for hypoglycemia include no dizziness or headaches. (Pt reports 1-2 a week. ) Pertinent negatives for diabetes include no blurred vision, no fatigue, no foot paresthesias, no foot ulcerations, no polydipsia, no polyphagia, no polyuria, no visual change, no weakness and no weight loss. Symptoms are stable. He is compliant with treatment all of the time. He is following a generally healthy diet. He rarely participates in exercise. An ACE inhibitor/angiotensin II receptor blocker is not being taken. He does not see a podiatrist.Eye exam is current.  Hypertension  This is a chronic problem. The problem is controlled. Pertinent negatives include no anxiety, blurred vision, headaches, malaise/fatigue, palpitations, peripheral edema or shortness of breath. There are no associated agents to hypertension. Risk factors for coronary artery disease include diabetes mellitus. There are no compliance problems.    Lab Results  Component Value Date   CHOL 193 01/21/2018   CHOL 159 07/29/2013   Lab Results  Component Value Date   HDL 67 01/21/2018   HDL 69 07/29/2013   Lab Results  Component Value Date   LDLCALC 85 01/21/2018   LDLCALC 49 07/29/2013   Lab Results  Component Value Date   TRIG 203 (H) 01/21/2018   TRIG 207 (A) 07/29/2013   Lab Results  Component Value Date   CHOLHDL 2.9 01/21/2018   No results found for: LDLDIRECT BP Readings from Last 3 Encounters:  05/23/18 124/88  01/21/18 112/84  01/12/18 (!) 142/90   Wt Readings from Last 3 Encounters:   05/23/18 220 lb (99.8 kg)  01/21/18 226 lb 3.2 oz (102.6 kg)  01/12/18 224 lb (101.6 kg)   Lab Results  Component Value Date   HGBA1C 6.4 01/21/2018      Past Medical History:  Diagnosis Date  . Allergy   . Bipolar 1 disorder ([place])   . Depression   . Dysrhythmia    "FLUTTERING"  . GERD (gastroesophageal reflux disease)    NO MEDS-WELL-CONTROLLED  . Gout   . History of substance abuse   . Hypertension 2011   OFF MEDS SINCE 2014-EATING BETTER AND LOST WEIGHT-BP BETTER PER PT  . 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 Ulcerative colitis ([place]) 1990   Past Surgical History:  Procedure Laterality Date  . APPENDECTOMY  1990   TAKEN OUT WITH COLECTOMY  . APPLICATION OF WOUND VAC N/A 07/01/2015   Procedure: APPLICATION OF WOUND VAC;  Surgeon: Robert Bellow, MD;  Location: [facility] ORS;  Service: General;  Laterality: N/A;  . COLECTOMY  1990  . HAND SURGERY  2011  . ILEOSTOMY  1990  . MINOR APPLICATION OF WOUND VAC N/A 06/29/2015   Procedure: MINOR APPLICATION OF WOUND VAC;  Surgeon: Robert Bellow, MD;  Location: [facility];  Service: General;  Laterality: N/A;  . MINOR APPLICATION OF WOUND VAC N/A 07/01/2015   Procedure: WOUND VAC CHANGE;  Surgeon: Robert Bellow, MD;  Location: [facility];  Service: General;  Laterality:  N/A;  . OTHER SURGICAL HISTORY     MULTIPLE ABDOMINAL SURGERIES  . SHOULDER SURGERY  2001  . TOTAL COLECTOMY  1990  . UMBILICAL HERNIA REPAIR  2001  . VENTRAL HERNIA REPAIR N/A 06/13/2015   Procedure: HERNIA REPAIR VENTRAL ADULT;  Surgeon: Robert Bellow, MD;  Location: [facility];  Service: General;  Laterality: N/A;  . WOUND DEBRIDEMENT N/A 06/29/2015   Procedure: DEBRIDEMENT ABDOMINAL WOUND;  Surgeon: Robert Bellow, MD;  Location: [facility];  Service: General;  Laterality: N/A;  . WOUND DEBRIDEMENT N/A 07/05/2015   Procedure: Delayed primary closure of abdominal wound ;  Surgeon: Robert Bellow, MD;  Location: [facility];   Service: General;  Laterality: N/A;   Family History  Problem Relation Age of Onset  . Hyperlipidemia Mother   . Diabetes Father   . Vascular Disease Father   . Alcohol abuse Father   . Depression Father   . Hyperlipidemia Brother   . CVA Maternal Grandfather    Allergies  Allergen Reactions  . Cefaclor Rash and Hives  . Ciprofloxacin Rash and Hives  . Naproxen Other (See Comments)    Last time given had GI bleeding into his Ostomy bag     Current Outpatient Medications:  .  blood glucose meter kit and supplies KIT, Dispense based on patient and insurance preference. Test fasting sugar once a day and if any symptoms of hypoglycemia as directed. (FOR ICD-9 250.00, 250.01)., Disp: 1 each, Rfl: 0 .  ibuprofen (ADVIL,MOTRIN) 200 MG tablet, Take 200 mg by mouth every 4 (four) hours as needed for mild pain or moderate pain., Disp: , Rfl:  .  metFORMIN (GLUCOPHAGE) 500 MG tablet, Take 1 tablet (500 mg total) by mouth 2 (two) times daily with a meal., Disp: 180 tablet, Rfl: 3 .  QUEtiapine (SEROQUEL) 400 MG tablet, Take 1 tablet (400 mg total) by mouth at bedtime., Disp: 7 tablet, Rfl: 0 .  sildenafil (VIAGRA) 100 MG tablet, TAKE 1 TABLET BY MOUTH 1 TO 4 HOURS PRIOR TO INTERCOURSE, Disp: 10 tablet, Rfl: 1  Review of Systems  Constitutional: Negative.  Negative for fatigue, malaise/fatigue and weight loss.  Eyes: Negative for blurred vision.  Respiratory: Negative.  Negative for shortness of breath.   Cardiovascular: Negative.  Negative for palpitations.  Gastrointestinal: Negative.   Endocrine: Negative for polydipsia, polyphagia and polyuria.  Neurological: Negative for dizziness, weakness, light-headedness and headaches.   Social History   Tobacco Use  . Smoking status: Current Some Day Smoker    Packs/day: 0.50    Years: 16.00    Pack years: 8.00    Types: Cigarettes  . Smokeless tobacco: Never Used  . Tobacco comment: has been quit "couple of months"  Substance Use Topics   . Alcohol use: No    Alcohol/week: 0.0 standard drinks   Objective:   BP 124/88 (BP Location: Right Arm, Patient Position: Sitting, Cuff Size: Large)   Pulse 94   Temp 97.9 F (36.6 C) (Oral)   Wt 220 lb (99.8 kg)   SpO2 96%   BMI 30.68 kg/m   Wt Readings from Last 3 Encounters:  05/23/18 220 lb (99.8 kg)  01/21/18 226 lb 3.2 oz (102.6 kg)  01/12/18 224 lb (101.6 kg)    Vitals:   05/23/18 0936  BP: 124/88  Pulse: 94  Temp: 97.9 F (36.6 C)  TempSrc: Oral  SpO2: 96%  Weight: 220 lb (99.8 kg)   Results for orders placed or performed in visit  on 05/23/18  POCT glycosylated hemoglobin (Hb A1C)  Result Value Ref Range   Hemoglobin A1C 6.5 (A) 4.0 - 5.6 %   HbA1c POC (<> result, manual entry)     HbA1c, POC (prediabetic range)     HbA1c, POC (controlled diabetic range)     Physical Exam  Constitutional: He is oriented to person, place, and time. He appears well-developed and well-nourished. No distress.  HENT:  Head: Normocephalic and atraumatic.  Right Ear: Hearing normal.  Left Ear: Hearing normal.  Nose: Nose normal.  Eyes: Conjunctivae and lids are normal. Right eye exhibits no discharge. Left eye exhibits no discharge. No scleral icterus.  Cardiovascular: Normal rate and regular rhythm.  Pulmonary/Chest: Effort normal and breath sounds normal. No respiratory distress.  Abdominal: Soft. Bowel sounds are normal.  Ileostomy RLQ after total colectomy in 1990.  Musculoskeletal: Normal range of motion.  Neurological: He is alert and oriented to person, place, and time.  Skin: Skin is intact. No lesion and no rash noted.  Psychiatric: He has a normal mood and affect. His speech is normal and behavior is normal. Thought content normal.      Assessment & Plan:          1. Essential hypertension Good BP control. No longer on an ACE inhibitor. Weight stable and denies chest pains, dyspnea or edema. Will get CBC, CMP and Lipid panel. Restrict sodium in diet and recheck  pending lab reports.  2. Diabetes mellitus without complication ([place]) N1A up slightly to 6.5  from 6.4 last check in April 2019. States he has been off the Metformin for the past 2 months and following diet alone. No polydipsia, polyuria, vision disturbance or peripheral neuropathies. Will recheck CBC, CMP and Lipid panel. Financially unable to afford extra medications at the present. - POCT glycosylated hemoglobin (Hb A1C)  3. Elevated uric acid in blood Most recent flare was at the end of April. Uses Ibuprofen if any joint pains. No recent red, swollen or painful joints. Last uric acid level was 7.1 on 01-21-18. Will recheck liver and kidney function blood tests. Follow up pending lab reports. - Uric acid    Vernie Murders, PA  [facility]

## 2018-05-24 LAB — COMPREHENSIVE METABOLIC PANEL
A/G RATIO: 1.3 (ref 1.2–2.2)
ALBUMIN: 4.1 g/dL (ref 3.5–5.5)
ALT: 29 IU/L (ref 0–44)
AST: 23 IU/L (ref 0–40)
Alkaline Phosphatase: 102 IU/L (ref 39–117)
BILIRUBIN TOTAL: 0.6 mg/dL (ref 0.0–1.2)
BUN / CREAT RATIO: 11 (ref 9–20)
BUN: 11 mg/dL (ref 6–24)
CO2: 21 mmol/L (ref 20–29)
Calcium: 9.2 mg/dL (ref 8.7–10.2)
Chloride: 100 mmol/L (ref 96–106)
Creatinine, Ser: 1.02 mg/dL (ref 0.76–1.27)
GFR calc non Af Amer: 84 mL/min/{1.73_m2} (ref >59)
GFR, EST AFRICAN AMERICAN: 97 mL/min/{1.73_m2} (ref >59)
Globulin, Total: 3.1 g/dL (ref 1.5–4.5)
Glucose: 110 mg/dL — ABNORMAL HIGH (ref 65–99)
POTASSIUM: 4.5 mmol/L (ref 3.5–5.2)
Sodium: 140 mmol/L (ref 134–144)
TOTAL PROTEIN: 7.2 g/dL (ref 6.0–8.5)

## 2018-05-24 LAB — CBC WITH DIFFERENTIAL/PLATELET
BASOS: 1 %
Basophils Absolute: 0.1 10*3/uL (ref 0.0–0.2)
EOS (ABSOLUTE): 0.1 10*3/uL (ref 0.0–0.4)
Eos: 1 %
HEMOGLOBIN: 15.6 g/dL (ref 13.0–17.7)
Hematocrit: 47.1 % (ref 37.5–51.0)
IMMATURE GRANS (ABS): 0 10*3/uL (ref 0.0–0.1)
Immature Granulocytes: 1 %
LYMPHS ABS: 1.1 10*3/uL (ref 0.7–3.1)
LYMPHS: 20 %
MCH: 31 pg (ref 26.6–33.0)
MCHC: 33.1 g/dL (ref 31.5–35.7)
MCV: 94 fL (ref 79–97)
Monocytes Absolute: 0.4 10*3/uL (ref 0.1–0.9)
Monocytes: 7 %
NEUTROS ABS: 3.9 10*3/uL (ref 1.4–7.0)
Neutrophils: 70 %
Platelets: 260 10*3/uL (ref 150–450)
RBC: 5.03 x10E6/uL (ref 4.14–5.80)
RDW: 14.3 % (ref 12.3–15.4)
WBC: 5.5 10*3/uL (ref 3.4–10.8)

## 2018-05-24 LAB — LIPID PANEL
Chol/HDL Ratio: 2.1 ratio (ref 0.0–5.0)
Cholesterol, Total: 163 mg/dL (ref 100–199)
HDL: 78 mg/dL (ref >39)
LDL Calculated: 11 mg/dL (ref 0–99)
Triglycerides: 368 mg/dL — ABNORMAL HIGH (ref 0–149)
VLDL Cholesterol Cal: 74 mg/dL — ABNORMAL HIGH (ref 5–40)

## 2018-05-24 LAB — URIC ACID: URIC ACID: 10.1 mg/dL — AB (ref 3.7–8.6)

## 2018-05-27 ENCOUNTER — Telehealth: Payer: Self-pay

## 2018-05-27 NOTE — Telephone Encounter (Signed)
LMTCB 05/27/2018  Thanks,   -Mickel Baas

## 2018-05-27 NOTE — Telephone Encounter (Signed)
-----   Message from Margo Common, [place] sent at 05/26/2018  8:51 PM EDT ----- Triglycerides high and uric acid level high. Remainder of labs are normal. Lower fats in diet and restrict purines (can get diet suggestions online or come by the office for a printout).

## 2018-05-29 ENCOUNTER — Telehealth: Payer: Self-pay

## 2018-05-29 NOTE — Telephone Encounter (Signed)
Patient advised as directed below. Patient is asking if he needs to take the Metformin.He reports that he told the provider that he had stop the Metformin and he just wants to know if he should restart the Metformin or continue with the diet and exercise.

## 2018-05-29 NOTE — Telephone Encounter (Signed)
LMTCB 05/29/2018  Thanks,   -Mickel Baas

## 2018-05-29 NOTE — Telephone Encounter (Signed)
Continue Metformin 500 mg qd and continue to work on diet with regular exercise to help lose some weight. Recheck progress in 3-4 months.

## 2018-05-30 NOTE — Telephone Encounter (Signed)
Why does he need the referral? If having leakage or irritation around the stoma/ileostomy, a general surgeon referral may be more appropriate.

## 2018-05-30 NOTE — Telephone Encounter (Signed)
Pt advised.  He would also like to have a referral to GI secondary to his Ileostomy.  Thanks,   -Mickel Baas

## 2018-05-30 NOTE — Telephone Encounter (Signed)
Patient returned call and would like to discuss a referral

## 2018-06-02 NOTE — Telephone Encounter (Signed)
Spoke with pt.  He reports having "burning" at his ileostomy site on the "inside".  He is not sure if it is "leaking" on the inside.  His skin is not burning.  He is not sure if he needs a GI or Education officer, environmental.  Please advise.   Thanks,   -Mickel Baas

## 2018-06-03 ENCOUNTER — Other Ambulatory Visit: Payer: Self-pay | Admitting: Family Medicine

## 2018-06-03 DIAGNOSIS — Z932 Ileostomy status: Secondary | ICD-10-CM

## 2018-06-03 NOTE — Telephone Encounter (Signed)
Placed order for surgical referral for burning pain inside of ileostomy.

## 2018-06-03 NOTE — Telephone Encounter (Signed)
Pt states the surgeon retired.  He would like to see a surgeon closer to home if possible.  Please refer.   Thanks,   -Mickel Baas

## 2018-06-03 NOTE — Telephone Encounter (Signed)
Left message advising pt we need the name of his surgeon.   Thanks,   -Mickel Baas

## 2018-06-03 NOTE — Telephone Encounter (Signed)
Would recommend going back to the surgeon that did the ostomy surgery.

## 2018-06-12 ENCOUNTER — Ambulatory Visit: Payer: Self-pay | Admitting: Surgery

## 2018-06-17 ENCOUNTER — Encounter: Payer: Self-pay | Admitting: Surgery

## 2018-06-17 ENCOUNTER — Ambulatory Visit (INDEPENDENT_AMBULATORY_CARE_PROVIDER_SITE_OTHER): Payer: Medicaid Other | Admitting: Surgery

## 2018-06-17 VITALS — BP 132/87 | HR 102 | Temp 98.2°F | Resp 16 | Ht 71.0 in | Wt 225.0 lb

## 2018-06-17 DIAGNOSIS — K435 Parastomal hernia without obstruction or  gangrene: Secondary | ICD-10-CM

## 2018-06-17 NOTE — Patient Instructions (Addendum)
Recommend referral to [facility] for evaluation of Parastomal Hernia Recommend discussing with his primary care for recommendations of possibly taking a low dose imodium as needed for the increased liquid stools

## 2018-06-21 ENCOUNTER — Encounter: Payer: Self-pay | Admitting: Surgery

## 2018-06-21 NOTE — Progress Notes (Signed)
Surgical Clinic Progress/Follow-up Note   HPI:  52 y.o. 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 Patient describes he has since undergone revision of his ileostomy with repair of parastomal hernia and was most recently 2 years ago referred by Dr. Bary Castilla to [facility] for surgical consultation, at which relocation of patient's end-ileostomy was advised. Patient, however, states he did not want to consider this option, has not since returned to [facility], and presents again today requesting re-revision of his ileostomy with repair of his chronic parastomal hernia for what he describes as "deep" pain around his chronic ileostomy. Patient reports he typically empties his ileostomy bag 10 - 15 times per day, but is able to maintain adequate hydration. He acknowledges that he was previously prescribed Imodium, but says the dose was too high and caused constipation. He otherwise denies abdominal distention, N/V, fever/chills, CP, or SOB.  Review of Systems:  Constitutional: denies any other weight loss, fever, chills, or sweats  Eyes: denies any other vision changes, history of eye injury  ENT: denies sore throat, hearing problems  Respiratory: denies shortness of breath, wheezing  Cardiovascular: denies chest pain, palpitations  Gastrointestinal: abdominal pain, N/V, and bowel function as per HPI Musculoskeletal: denies any other joint pains or cramps  Skin: Denies any other rashes or skin discolorations  Neurological: denies any other headache, dizziness, weakness  Psychiatric: denies any other depression, anxiety  All other review of systems: otherwise negative   Vital Signs:  BP 132/87   Pulse (!) 102    Temp 98.2 F (36.8 C) (Skin)   Resp 16   Ht 5\' 11"  (1.803 m)   Wt 225 lb (102.1 kg)   SpO2 97%   BMI 31.38 kg/m    Physical Exam:  Constitutional:  -- Overweight body habitus  -- Awake, alert, and oriented x3  Eyes:  -- Pupils equally round and reactive to light  -- No scleral icterus  Ear, nose, throat:  -- No jugular venous distension  -- No nasal drainage, bleeding Pulmonary:  -- No crackles -- Equal breath sounds bilaterally -- Breathing non-labored at rest Cardiovascular:  -- S1, S2 present  -- No pericardial rubs  Gastrointestinal:  -- Soft and non-distended with palpable parastomal hernia and mild peri-stomal tenderness to palpation, no guarding/rebound tenderness, ileostomy pink and viable with enteric contents and gas in ostomy appliance bag -- No abdominal masses appreciated, pulsatile or otherwise  Musculoskeletal / Integumentary:  -- Wounds or skin discoloration: No open wounds appreciated  -- Extremities: B/L UE and LE FROM, hands and feet warm, no edema  Neurologic:  -- Motor function: intact and symmetric  -- Sensation: intact and symmetric   Imaging:  CT Abdomen and Pelvis with Contrast (11/18/2017) - personally reviewed and discussed with patient Stomach physiologically distended. There are no dilated or thickened small bowel loops. Post total colectomy with ileostomy in the right lower quadrant. Unchanged appearance of the ostomy with herniation of fat and a right lower quadrant small bowel loop without inflammation or obstruction.   Assessment:  52 y.o. yo Male with a problem list including...  Patient Active Problem List   Diagnosis Date Noted  . Long term current use of opiate analgesic 05/20/2017  . Long term prescription opiate use 05/20/2017  . Opiate use  05/20/2017  . Chronic pain syndrome 05/20/2017  . Chronic hand pain (Primary Area of Pain)( Right) 05/20/2017  . Chronic upper extremity pain (Secondary Area of Pain)(right) 05/20/2017   . Paresthesia of right upper extremity 05/20/2017  . 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 H/O ventral hernia repair 12/13/2015  . History of ileostomy 12/13/2015  . 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 Peristomal hernia 12/09/2015  . Postoperative wound infection 07/16/2015  . Bipolar I disorder, most recent episode depressed ([place]) 07/01/2015  . Borderline personality disorder ([place]) 07/01/2015  . Alcohol abuse 07/01/2015  . Wound infection after surgery 06/25/2015  . Post-operative wound abscess 06/23/2015  . Ventral hernia 06/13/2015  . 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 Hernia, umbilical 09/26/7251  . 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 Chronic pain 05/18/2015  . CC (Crohn's colitis) ([place]) 05/18/2015  . Clinical depression 05/18/2015  . Failure of erection 05/18/2015  . 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 Personal history of urinary disorder 05/18/2015  . H/O malignant neoplasm 05/18/2015  . H/O: substance abuse 05/18/2015  . Personal history of arthritis 05/18/2015  . BP (high blood pressure) 05/18/2015  . 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 Colitis gravis ([place]) 05/18/2015  . B12 deficiency 05/18/2015  . 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  Plan:   - agree with university surgical referral   - agree with university surgical recommendation for relocation of ileostomy s/p multiple prior ileostomy revisions  - discussed with patient risks, benefits, and alternatives to recommended management and acknowledged he  may be able to find somebody willing to re-re-revise his permanent end-ileostomy with repair of parastomal hernia, but this would not be my recommendation and I am not aware of anybody who would recommend doing so  - return to clinic as needed, instructed to call office if any questions or concerns  - may likely benefit from low-dose Imodium (start with 2 mg)  All of the above recommendations were discussed with the patient, and all of patient's questions were answered to his expressed satisfaction.  -- Marilynne Drivers Rosana Hoes, MD, [place]: [facility] - Partnering for exceptional care. Office: [phone]

## 2018-07-18 ENCOUNTER — Telehealth: Payer: Self-pay | Admitting: Family Medicine

## 2018-07-18 ENCOUNTER — Other Ambulatory Visit: Payer: Self-pay | Admitting: Family Medicine

## 2018-07-18 DIAGNOSIS — Z85831 Personal history of malignant neoplasm of soft tissue: Secondary | ICD-10-CM

## 2018-07-18 NOTE — Telephone Encounter (Signed)
Pt stated that at his OV with Simona Huh on 05/23/18 Simona Huh looked at the possible cyst on pt's left pinky finger. Pt would like to have Dr. Evorn Gong look at it since it has gotten bigger since the OV but they won't see pt without a referral. Pt is requesting referral. Please advise. Thanks TNP

## 2018-07-18 NOTE — Telephone Encounter (Signed)
Placed order for dermatology referral.

## 2018-07-18 NOTE — Progress Notes (Signed)
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 New lesion on left 5 th finger enlarging since 05-23-18. Schedule dermatology referral.

## 2018-07-21 NOTE — Telephone Encounter (Signed)
Patient was advised.,PC 

## 2018-07-23 ENCOUNTER — Telehealth: Payer: Self-pay | Admitting: Family Medicine

## 2018-07-23 DIAGNOSIS — Z85831 Personal history of malignant neoplasm of soft tissue: Secondary | ICD-10-CM

## 2018-07-23 NOTE — Telephone Encounter (Signed)
Dermatology Referral was sent to wrong location.  Pt is needing his referral to go to: [facility] on [address] Evorn Gong  Please advise.  Thanks, [employer]

## 2018-07-23 NOTE — Telephone Encounter (Signed)
Okay to refer?  Thanks,   -Mickel Baas

## 2018-07-23 NOTE — Telephone Encounter (Signed)
ok 
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
## 2018-09-23 ENCOUNTER — Ambulatory Visit
Admission: RE | Admit: 2018-09-23 | Discharge: 2018-09-23 | Disposition: A | Discharge status: Home / Self Care | Payer: Medicaid Other | Source: Ambulatory Visit | Attending: Family Medicine | Admitting: Family Medicine

## 2018-09-23 ENCOUNTER — Ambulatory Visit
Admission: RE | Admit: 2018-09-23 | Discharge: 2018-09-23 | Disposition: A | Discharge status: Home / Self Care | Payer: Medicaid Other | Attending: Family Medicine | Admitting: Family Medicine

## 2018-09-23 ENCOUNTER — Encounter: Payer: Self-pay | Admitting: Family Medicine

## 2018-09-23 ENCOUNTER — Ambulatory Visit (INDEPENDENT_AMBULATORY_CARE_PROVIDER_SITE_OTHER): Payer: Medicaid Other | Admitting: Family Medicine

## 2018-09-23 VITALS — BP 149/97 | HR 88 | Temp 97.9°F | Resp 16 | Ht 71.0 in | Wt 231.0 lb

## 2018-09-23 DIAGNOSIS — M25552 Pain in left hip: Secondary | ICD-10-CM | POA: Diagnosis present

## 2018-09-23 DIAGNOSIS — I10 Essential (primary) hypertension: Secondary | ICD-10-CM | POA: Diagnosis not present

## 2018-09-23 DIAGNOSIS — E119 Type 2 diabetes mellitus without complications: Secondary | ICD-10-CM

## 2018-09-23 MED ORDER — METHOCARBAMOL 500 MG PO TABS: 500.0000 mg | ORAL_TABLET | Freq: Three times a day (TID) | ORAL | 0 refills | Status: DC | PRN

## 2018-09-24 LAB — HEMOGLOBIN A1C
Est. average glucose Bld gHb Est-mCnc: 151 mg/dL
HEMOGLOBIN A1C: 6.9 % — AB (ref 4.8–5.6)

## 2018-09-24 LAB — CBC WITH DIFFERENTIAL/PLATELET
BASOS: 1 %
Basophils Absolute: 0.1 10*3/uL (ref 0.0–0.2)
EOS (ABSOLUTE): 0.1 10*3/uL (ref 0.0–0.4)
Eos: 1 %
Hematocrit: 41.1 % (ref 37.5–51.0)
Hemoglobin: 14.3 g/dL (ref 13.0–17.7)
IMMATURE GRANS (ABS): 0.2 10*3/uL — AB (ref 0.0–0.1)
IMMATURE GRANULOCYTES: 2 %
LYMPHS: 12 %
Lymphocytes Absolute: 1.1 10*3/uL (ref 0.7–3.1)
MCH: 31.4 pg (ref 26.6–33.0)
MCHC: 34.8 g/dL (ref 31.5–35.7)
MCV: 90 fL (ref 79–97)
MONOS ABS: 0.6 10*3/uL (ref 0.1–0.9)
Monocytes: 7 %
NEUTROS PCT: 77 %
Neutrophils Absolute: 7.4 10*3/uL — ABNORMAL HIGH (ref 1.4–7.0)
PLATELETS: 230 10*3/uL (ref 150–450)
RBC: 4.56 x10E6/uL (ref 4.14–5.80)
RDW: 12.5 % (ref 12.3–15.4)
WBC: 9.4 10*3/uL (ref 3.4–10.8)

## 2018-09-24 LAB — COMPREHENSIVE METABOLIC PANEL
ALBUMIN: 4 g/dL (ref 3.5–5.5)
ALK PHOS: 111 IU/L (ref 39–117)
ALT: 48 IU/L — ABNORMAL HIGH (ref 0–44)
AST: 61 IU/L — ABNORMAL HIGH (ref 0–40)
Albumin/Globulin Ratio: 1.3 (ref 1.2–2.2)
BILIRUBIN TOTAL: 0.6 mg/dL (ref 0.0–1.2)
BUN / CREAT RATIO: 13 (ref 9–20)
BUN: 14 mg/dL (ref 6–24)
CHLORIDE: 102 mmol/L (ref 96–106)
CO2: 22 mmol/L (ref 20–29)
CREATININE: 1.06 mg/dL (ref 0.76–1.27)
Calcium: 9.7 mg/dL (ref 8.7–10.2)
GFR calc Af Amer: 92 mL/min/{1.73_m2} (ref >59)
GFR calc non Af Amer: 80 mL/min/{1.73_m2} (ref >59)
GLUCOSE: 128 mg/dL — AB (ref 65–99)
Globulin, Total: 3 g/dL (ref 1.5–4.5)
Potassium: 4.4 mmol/L (ref 3.5–5.2)
Sodium: 140 mmol/L (ref 134–144)
Total Protein: 7 g/dL (ref 6.0–8.5)

## 2018-09-24 LAB — LIPID PANEL
CHOLESTEROL TOTAL: 198 mg/dL (ref 100–199)
Chol/HDL Ratio: 2.6 ratio (ref 0.0–5.0)
HDL: 75 mg/dL (ref >39)
LDL Calculated: 96 mg/dL (ref 0–99)
TRIGLYCERIDES: 135 mg/dL (ref 0–149)
VLDL CHOLESTEROL CAL: 27 mg/dL (ref 5–40)

## 2018-09-29 ENCOUNTER — Telehealth: Payer: Self-pay

## 2018-09-29 NOTE — Telephone Encounter (Signed)
LMTCB

## 2018-09-29 NOTE — Telephone Encounter (Signed)
Please Review

## 2018-09-29 NOTE — Telephone Encounter (Signed)
Patient c/o persistent left hip pain. Patient reports that pain the same as last time he was seen 09/23/18. Worse with movement. Patient wanting to know if any additional imaging may be needed. Please advise.  [phone]

## 2018-09-29 NOTE — Telephone Encounter (Signed)
Would recommend referral to an orthopedist for further evaluation and possible cortisone injection.

## 2018-09-30 ENCOUNTER — Other Ambulatory Visit: Payer: Self-pay | Admitting: Family Medicine

## 2018-09-30 DIAGNOSIS — M25552 Pain in left hip: Secondary | ICD-10-CM

## 2018-09-30 NOTE — Telephone Encounter (Signed)
Patient advised as directed below. He agreed to the referral.

## 2018-09-30 NOTE — Telephone Encounter (Signed)
Placed order in chart. He will be notified when appoint is scheduled.

## 2018-10-01 ENCOUNTER — Telehealth: Payer: Self-pay | Admitting: Family Medicine

## 2018-10-01 ENCOUNTER — Telehealth: Payer: Self-pay

## 2018-10-01 ENCOUNTER — Ambulatory Visit: Payer: Self-pay | Admitting: Physician Assistant

## 2018-10-01 NOTE — Telephone Encounter (Signed)
Pt wanting to go ahead with the cortozone shot.  Pt is in severe pain.   Please call pt back to let him know what is the next step for the shot.  ASAP  Thanks, [facility]

## 2018-10-01 NOTE — Progress Notes (Deleted)
Patient: Joshua Mays Male    DOB: Feb 20, 1966   53 y.o.   MRN: [id number] Visit Date: 10/01/2018  Today's Provider: Mar Daring, PA-C   No chief complaint on file.  Subjective:     Hip Pain      Allergies  Allergen Reactions  . Cefaclor Rash and Hives  . Ciprofloxacin Rash and Hives  . Naproxen Other (See Comments)    Last time given had GI bleeding into his Ostomy bag      Current Outpatient Medications:  .  blood glucose meter kit and supplies KIT, Dispense based on patient and insurance preference. Test fasting sugar once a day and if any symptoms of hypoglycemia as directed. (FOR ICD-9 250.00, 250.01)., Disp: 1 each, Rfl: 0 .  ibuprofen (ADVIL,MOTRIN) 200 MG tablet, Take 200 mg by mouth every 4 (four) hours as needed for mild pain or moderate pain., Disp: , Rfl:  .  metFORMIN (GLUCOPHAGE) 500 MG tablet, Take 1 tablet (500 mg total) by mouth 2 (two) times daily with a meal., Disp: 180 tablet, Rfl: 3 .  methocarbamol (ROBAXIN) 500 MG tablet, Take 1 tablet (500 mg total) by mouth every 8 (eight) hours as needed for muscle spasms., Disp: 30 tablet, Rfl: 0 .  QUEtiapine (SEROQUEL) 400 MG tablet, Take 1 tablet (400 mg total) by mouth at bedtime., Disp: 7 tablet, Rfl: 0  Review of Systems  Constitutional: Negative for appetite change, chills and fever.  Respiratory: Negative for chest tightness, shortness of breath and wheezing.   Cardiovascular: Negative for chest pain and palpitations.  Gastrointestinal: Negative for abdominal pain, nausea and vomiting.    Social History   Tobacco Use  . Smoking status: Current Some Day Smoker    Packs/day: 0.50    Years: 16.00    Pack years: 8.00    Types: Cigarettes  . Smokeless tobacco: Never Used  . Tobacco comment: has been quit "couple of months"  Substance Use Topics  . Alcohol use: No    Alcohol/week: 0.0 standard drinks      Objective:   There were no vitals taken for this visit. There were no vitals  filed for this visit.   Physical Exam      Assessment & [place], PA-C  [facility]

## 2018-10-01 NOTE — Telephone Encounter (Signed)
Patient reports that Joshua Mays scheduled him for today for the injection.

## 2018-10-01 NOTE — Telephone Encounter (Signed)
Spoke with patient and he reports that his pain is inside. Tawanna Sat asked me to call the patient and verify with him because if it was on the outside she is able to give the cortisone shot but if the pain is inside patient can walk in to [facility].

## 2018-10-02 ENCOUNTER — Ambulatory Visit: Payer: Self-pay | Admitting: Family Medicine

## 2018-10-15 ENCOUNTER — Emergency Department
Admission: EM | Admit: 2018-10-15 | Discharge: 2018-10-15 | Disposition: A | Discharge status: Home / Self Care | Payer: Medicaid Other | Attending: Emergency Medicine | Admitting: Emergency Medicine

## 2018-10-15 ENCOUNTER — Encounter: Payer: Self-pay | Admitting: Emergency Medicine

## 2018-10-15 DIAGNOSIS — Z932 Ileostomy status: Secondary | ICD-10-CM | POA: Diagnosis not present

## 2018-10-15 DIAGNOSIS — Z7984 Long term (current) use of oral hypoglycemic drugs: Secondary | ICD-10-CM | POA: Diagnosis not present

## 2018-10-15 DIAGNOSIS — Z79899 Other long term (current) drug therapy: Secondary | ICD-10-CM | POA: Insufficient documentation

## 2018-10-15 DIAGNOSIS — Z85831 Personal history of malignant neoplasm of soft tissue: Secondary | ICD-10-CM | POA: Diagnosis not present

## 2018-10-15 DIAGNOSIS — Z85828 Personal history of other malignant neoplasm of skin: Secondary | ICD-10-CM | POA: Insufficient documentation

## 2018-10-15 DIAGNOSIS — M25552 Pain in left hip: Secondary | ICD-10-CM | POA: Insufficient documentation

## 2018-10-15 DIAGNOSIS — I10 Essential (primary) hypertension: Secondary | ICD-10-CM | POA: Diagnosis not present

## 2018-10-15 DIAGNOSIS — F17210 Nicotine dependence, cigarettes, uncomplicated: Secondary | ICD-10-CM | POA: Diagnosis not present

## 2018-10-15 DIAGNOSIS — M545 Low back pain, unspecified: Secondary | ICD-10-CM

## 2018-10-15 MED ORDER — GABAPENTIN 300 MG PO CAPS: 300.0000 mg | ORAL_CAPSULE | Freq: Three times a day (TID) | ORAL | 0 refills | Status: DC

## 2018-10-15 MED ORDER — OXYCODONE-ACETAMINOPHEN 5-325 MG PO TABS: 1.0000 | ORAL_TABLET | Freq: Three times a day (TID) | ORAL | 0 refills | Status: AC | PRN

## 2018-10-15 MED ORDER — PREDNISONE 20 MG PO TABS: 20.0000 mg | ORAL_TABLET | Freq: Two times a day (BID) | ORAL | 0 refills | Status: AC

## 2018-10-15 MED ORDER — CYCLOBENZAPRINE HCL 5 MG PO TABS: 5.0000 mg | ORAL_TABLET | Freq: Three times a day (TID) | ORAL | 0 refills | Status: AC | PRN
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
## 2018-10-15 NOTE — Discharge Instructions (Signed)
Your exam is consistent with your complaint of LBP with LLE radicular pain. Take the prescription meds as directed. Follow-up with your ortho provider and primary care provider for further management.

## 2018-10-15 NOTE — ED Triage Notes (Signed)
Pt reports pain to left hip for awhile. Pt states [facility] went to [facility] but they could not see him for a while. Pt states that the pain is too much. Pt reports ibuprofen not helping. Pt states that nothing works for pain at all.

## 2018-10-22 ENCOUNTER — Emergency Department
Admission: EM | Admit: 2018-10-22 | Discharge: 2018-10-22 | Disposition: A | Discharge status: Home / Self Care | Payer: Medicaid Other | Attending: Emergency Medicine | Admitting: Emergency Medicine

## 2018-10-22 ENCOUNTER — Encounter: Payer: Self-pay | Admitting: Emergency Medicine

## 2018-10-22 ENCOUNTER — Other Ambulatory Visit: Payer: Self-pay

## 2018-10-22 DIAGNOSIS — Z79899 Other long term (current) drug therapy: Secondary | ICD-10-CM | POA: Insufficient documentation

## 2018-10-22 DIAGNOSIS — M5442 Lumbago with sciatica, left side: Secondary | ICD-10-CM | POA: Diagnosis not present

## 2018-10-22 DIAGNOSIS — F17200 Nicotine dependence, unspecified, uncomplicated: Secondary | ICD-10-CM | POA: Insufficient documentation

## 2018-10-22 DIAGNOSIS — I10 Essential (primary) hypertension: Secondary | ICD-10-CM | POA: Diagnosis not present

## 2018-10-22 DIAGNOSIS — R1032 Left lower quadrant pain: Secondary | ICD-10-CM | POA: Diagnosis present

## 2018-10-22 LAB — CBC
HCT: 42.9 % (ref 39.0–52.0)
Hemoglobin: 14.3 g/dL (ref 13.0–17.0)
MCH: 31.2 pg (ref 26.0–34.0)
MCHC: 33.3 g/dL (ref 30.0–36.0)
MCV: 93.5 fL (ref 80.0–100.0)
NRBC: 0 % (ref 0.0–0.2)
Platelets: 199 10*3/uL (ref 150–400)
RBC: 4.59 MIL/uL (ref 4.22–5.81)
RDW: 13.3 % (ref 11.5–15.5)
WBC: 10.5 10*3/uL (ref 4.0–10.5)

## 2018-10-22 LAB — COMPREHENSIVE METABOLIC PANEL
ALT: 63 U/L — AB (ref 0–44)
AST: 49 U/L — ABNORMAL HIGH (ref 15–41)
Albumin: 3.5 g/dL (ref 3.5–5.0)
Alkaline Phosphatase: 115 U/L (ref 38–126)
Anion gap: 8 (ref 5–15)
BUN: 12 mg/dL (ref 6–20)
CHLORIDE: 104 mmol/L (ref 98–111)
CO2: 22 mmol/L (ref 22–32)
CREATININE: 0.94 mg/dL (ref 0.61–1.24)
Calcium: 8.3 mg/dL — ABNORMAL LOW (ref 8.9–10.3)
GFR calc Af Amer: >60 mL/min (ref >60)
GFR calc non Af Amer: >60 mL/min (ref >60)
Glucose, Bld: 220 mg/dL — ABNORMAL HIGH (ref 70–99)
Potassium: 4.1 mmol/L (ref 3.5–5.1)
Sodium: 134 mmol/L — ABNORMAL LOW (ref 135–145)
Total Bilirubin: 0.7 mg/dL (ref 0.3–1.2)
Total Protein: 7 g/dL (ref 6.5–8.1)

## 2018-10-22 MED ORDER — OXYCODONE-ACETAMINOPHEN 5-325 MG PO TABS: 1.0000 | ORAL_TABLET | ORAL | 0 refills | Status: DC | PRN

## 2018-10-22 NOTE — ED Notes (Signed)
Pt up in room changing TV channel
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
## 2018-10-22 NOTE — ED Triage Notes (Signed)
Pt c/o lft flank pain. PT states he has illiostomy and noticed bright red blood in his bag today. VSS

## 2018-10-29 DIAGNOSIS — M545 Low back pain, unspecified: Secondary | ICD-10-CM | POA: Insufficient documentation

## 2018-10-31 ENCOUNTER — Telehealth: Payer: Self-pay | Admitting: Family Medicine

## 2018-10-31 NOTE — Telephone Encounter (Signed)
[employer] [phone]  Ext [phone]  Calling to check on ostomy supplies request was faxed over on Jan 30 and Feb 4 for pt.  Please advise asap.  Thanks, [employer]

## 2018-10-31 NOTE — Telephone Encounter (Signed)
Copy indicates the prescription for medical supplies (colostomy supplies) was faxed on 10-24-18 with most recent chart note by RP.

## 2018-10-31 NOTE — Telephone Encounter (Signed)
Refaxed and Maxine advised.

## 2018-11-04 NOTE — Telephone Encounter (Signed)
[place] -  Phone [phone]  Primary Diagnosis is not listed in office note, will need this included. Will be faxing an amendment form.  Thanks, [employer]

## 2018-11-04 NOTE — Telephone Encounter (Signed)
Awaiting amendment form.

## 2018-11-06 ENCOUNTER — Telehealth: Payer: Self-pay

## 2018-11-06 NOTE — Telephone Encounter (Signed)
Form faxed

## 2018-11-06 NOTE — Telephone Encounter (Signed)
Done and ready to be faxed. Placed copies on your desk.

## 2018-11-06 NOTE — Telephone Encounter (Signed)
Received form and placed it on Joshua Mays's desk.

## 2018-11-06 NOTE — Telephone Encounter (Signed)
Joshua Mays from [employer] called to get an update on the fax she sent earlier this week. She states she sent a fax requesting that Simona Huh make an addendum to the office note from 09/23/2018 to include the dx: colostomy, She states the prescription they received was for colostomy, but the office note didn't match. Please call Joshua Mays back to let her know the status of the Adendum. He call back number is [phone] ext [phone].

## 2018-11-07 NOTE — Telephone Encounter (Signed)
Maxine from [employer] called back to check on the status of diagnosis for the the prescription needs to be corrected.    Please call Maxine back at the above number.  Thanks, [employer]

## 2018-11-07 NOTE — Telephone Encounter (Signed)
Returned call. Form was re faxed.
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
## 2018-11-18 ENCOUNTER — Ambulatory Visit: Payer: Medicaid Other | Admitting: Family Medicine

## 2018-11-18 ENCOUNTER — Encounter: Payer: Self-pay | Admitting: Family Medicine

## 2018-11-18 VITALS — BP 118/88 | HR 98 | Temp 98.1°F | Wt 230.4 lb

## 2018-11-18 DIAGNOSIS — M5442 Lumbago with sciatica, left side: Secondary | ICD-10-CM | POA: Diagnosis not present

## 2018-11-18 DIAGNOSIS — F3130 Bipolar disorder, current episode depressed, mild or moderate severity, unspecified: Secondary | ICD-10-CM

## 2018-11-18 MED ORDER — QUETIAPINE FUMARATE 400 MG PO TABS: 400.0000 mg | ORAL_TABLET | Freq: Every day | ORAL | 0 refills | Status: DC

## 2018-11-21 DIAGNOSIS — M25552 Pain in left hip: Secondary | ICD-10-CM | POA: Insufficient documentation

## 2018-11-27 ENCOUNTER — Telehealth: Payer: Self-pay

## 2018-11-27 NOTE — Telephone Encounter (Signed)
Patient said he saw [facility] and that they said they weren't going to do anything for him and just leave him in pain.  He said he wants a second opinion therefore would like another referral.  Would prefer [facility] but wants to see a spinal surgeon.  Patient said he was in so much pain he 'was going to die'.

## 2018-11-27 NOTE — Telephone Encounter (Signed)
May use Salonpas Lidocaine patches or Icy-Hot with Lidocaine Cream to back for pain. May be referred to a neurosurgeon with history of disc herniation. He will have to get MRI scan from Dr. Sabra Heck to help with the evaluation.

## 2018-12-01 ENCOUNTER — Other Ambulatory Visit: Payer: Self-pay | Admitting: Family Medicine

## 2018-12-01 ENCOUNTER — Telehealth: Payer: Self-pay | Admitting: Family Medicine

## 2018-12-01 DIAGNOSIS — A084 Viral intestinal infection, unspecified: Secondary | ICD-10-CM

## 2018-12-01 MED ORDER — PROMETHAZINE HCL 25 MG PO TABS: 25.0000 mg | ORAL_TABLET | Freq: Three times a day (TID) | ORAL | 0 refills | Status: DC | PRN

## 2018-12-01 NOTE — Telephone Encounter (Signed)
Please advise 

## 2018-12-01 NOTE — Telephone Encounter (Signed)
Pt is vomiting and having diarrhea.  Pt has been taking all the medications that was recommended for him to take.  They are not helping.  Pt cannot come to an app physically.  Needing a call back asap at [phone].  Thanks, [employer]

## 2018-12-01 NOTE — Telephone Encounter (Signed)
Patient still having nausea but no vomiting today. Drinking fluids fairly well but still feeling weak. Sent refill of Phenergan 25 mg tablets 1 po qid prn #20 ([facility]). Advised to recheck here or in the ER if not continuing to improve in 2-3 days.

## 2018-12-03 NOTE — Telephone Encounter (Signed)
[id number] 

## 2018-12-10 NOTE — Telephone Encounter (Signed)
Patient was advised.  

## 2019-01-16 ENCOUNTER — Ambulatory Visit: Payer: Self-pay | Admitting: Family Medicine

## 2019-01-16 NOTE — Progress Notes (Deleted)
Patient: Joshua Mays Male    DOB: 02/13/1966   52 y.o.   MRN: [id number] Visit Date: 01/16/2019  Today's Provider: Dennis Chrismon, PA   No chief complaint on file.  Subjective:     HPI  Allergies  Allergen Reactions  . Cefaclor Rash and Hives  . Ciprofloxacin Rash and Hives  . Naproxen Other (See Comments)    Last time given had GI bleeding into his Ostomy bag      Current Outpatient Medications:  .  blood glucose meter kit and supplies KIT, Dispense based on patient and insurance preference. Test fasting sugar once a day and if any symptoms of hypoglycemia as directed. (FOR ICD-9 250.00, 250.01)., Disp: 1 each, Rfl: 0 .  gabapentin (NEURONTIN) 300 MG capsule, Take 1 capsule (300 mg total) by mouth 3 (three) times daily for 30 days. (Patient not taking: Reported on 11/18/2018), Disp: 90 capsule, Rfl: 0 .  ibuprofen (ADVIL,MOTRIN) 200 MG tablet, Take 200 mg by mouth every 4 (four) hours as needed for mild pain or moderate pain., Disp: , Rfl:  .  metFORMIN (GLUCOPHAGE) 500 MG tablet, Take 1 tablet (500 mg total) by mouth 2 (two) times daily with a meal., Disp: 180 tablet, Rfl: 3 .  oxyCODONE-acetaminophen (PERCOCET) 5-325 MG tablet, Take 1 tablet by mouth every 4 (four) hours as needed for severe pain. (Patient not taking: Reported on 11/18/2018), Disp: 10 tablet, Rfl: 0 .  promethazine (PHENERGAN) 25 MG tablet, Take 1 tablet (25 mg total) by mouth every 8 (eight) hours as needed for nausea or vomiting., Disp: 20 tablet, Rfl: 0 .  QUEtiapine (SEROQUEL) 400 MG tablet, Take 1 tablet (400 mg total) by mouth at bedtime., Disp: 30 tablet, Rfl: 0  Review of Systems  Social History   Tobacco Use  . Smoking status: Current Some Day Smoker    Packs/day: 0.50    Years: 16.00    Pack years: 8.00    Types: Cigarettes  . Smokeless tobacco: Never Used  . Tobacco comment: has been quit "couple of months"  Substance Use Topics  . Alcohol use: No    Alcohol/week: 0.0 standard  drinks      Objective:   There were no vitals taken for this visit. There were no vitals filed for this visit.   Physical Exam      Assessment & Plan        Dennis Chrismon, PA  [facility] 

## 2019-01-27 ENCOUNTER — Other Ambulatory Visit: Payer: Self-pay

## 2019-01-27 ENCOUNTER — Encounter: Payer: Medicaid Other | Admitting: Family Medicine

## 2019-01-27 DIAGNOSIS — Z719 Counseling, unspecified: Secondary | ICD-10-CM

## 2019-01-27 NOTE — Progress Notes (Signed)
Patient stopped by to report orthopedist (Dr. 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 The surgeon wants to schedule total left hip arthroplasty by the first of June, or sooner, if the pandemic restrictions at the hospital are lifted to allow the surgery. Patient is feeling better with Percocet for pain and knowing a resolution is in sight. Also, he has finally gotten his disability approved  6 days ago after trying to get approval over the past "6 years" - (Bipolar disorder, Chronic pain syndrome with history of Crohns Colitis and bowel resection requiring a permanent ileostomy bag). We contacted his surgeon and they will let us know when he may need pre-op medical clearance exam.

## 2019-01-30 ENCOUNTER — Other Ambulatory Visit: Payer: Self-pay

## 2019-01-30 ENCOUNTER — Encounter: Payer: Self-pay | Admitting: Family Medicine

## 2019-01-30 ENCOUNTER — Ambulatory Visit (INDEPENDENT_AMBULATORY_CARE_PROVIDER_SITE_OTHER): Payer: Medicaid Other | Admitting: Family Medicine

## 2019-01-30 VITALS — BP 130/82 | HR 99 | Temp 97.9°F | Ht 71.0 in | Wt 222.8 lb

## 2019-01-30 DIAGNOSIS — Z01818 Encounter for other preprocedural examination: Secondary | ICD-10-CM | POA: Diagnosis not present

## 2019-01-30 DIAGNOSIS — I10 Essential (primary) hypertension: Secondary | ICD-10-CM | POA: Diagnosis not present

## 2019-01-30 DIAGNOSIS — Z9889 Other specified postprocedural states: Secondary | ICD-10-CM

## 2019-01-30 DIAGNOSIS — E119 Type 2 diabetes mellitus without complications: Secondary | ICD-10-CM

## 2019-01-30 DIAGNOSIS — F3130 Bipolar disorder, current episode depressed, mild or moderate severity, unspecified: Secondary | ICD-10-CM | POA: Diagnosis not present

## 2019-01-30 LAB — POCT URINALYSIS DIPSTICK
Bilirubin, UA: NEGATIVE
Blood, UA: NEGATIVE
Glucose, UA: NEGATIVE
Ketones, UA: NEGATIVE
Leukocytes, UA: NEGATIVE
Nitrite, UA: NEGATIVE
Protein, UA: NEGATIVE
Spec Grav, UA: 1.025 (ref 1.010–1.025)
Urobilinogen, UA: 0.2 E.U./dL
pH, UA: 5 (ref 5.0–8.0)

## 2019-01-30 LAB — POCT GLYCOSYLATED HEMOGLOBIN (HGB A1C): Hemoglobin A1C: 7.3 % — AB (ref 4.0–5.6)
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
## 2019-01-31 LAB — CBC WITH DIFFERENTIAL/PLATELET
Basophils Absolute: 0.1 10*3/uL (ref 0.0–0.2)
Basos: 1 %
EOS (ABSOLUTE): 0.1 10*3/uL (ref 0.0–0.4)
Eos: 1 %
Hematocrit: 44.4 % (ref 37.5–51.0)
Hemoglobin: 14.7 g/dL (ref 13.0–17.7)
Immature Grans (Abs): 0.1 10*3/uL (ref 0.0–0.1)
Immature Granulocytes: 1 %
Lymphocytes Absolute: 1.4 10*3/uL (ref 0.7–3.1)
Lymphs: 14 %
MCH: 30.5 pg (ref 26.6–33.0)
MCHC: 33.1 g/dL (ref 31.5–35.7)
MCV: 92 fL (ref 79–97)
Monocytes Absolute: 0.7 10*3/uL (ref 0.1–0.9)
Monocytes: 6 %
Neutrophils Absolute: 8 10*3/uL — ABNORMAL HIGH (ref 1.4–7.0)
Neutrophils: 77 %
Platelets: 289 10*3/uL (ref 150–450)
RBC: 4.82 x10E6/uL (ref 4.14–5.80)
RDW: 13.1 % (ref 11.6–15.4)
WBC: 10.4 10*3/uL (ref 3.4–10.8)

## 2019-01-31 LAB — COMPREHENSIVE METABOLIC PANEL
ALT: 30 IU/L (ref 0–44)
AST: 34 IU/L (ref 0–40)
Albumin/Globulin Ratio: 1.5 (ref 1.2–2.2)
Albumin: 4.3 g/dL (ref 3.8–4.9)
Alkaline Phosphatase: 120 IU/L — ABNORMAL HIGH (ref 39–117)
BUN/Creatinine Ratio: 9 (ref 9–20)
BUN: 11 mg/dL (ref 6–24)
Bilirubin Total: 0.4 mg/dL (ref 0.0–1.2)
CO2: 22 mmol/L (ref 20–29)
Calcium: 10.3 mg/dL — ABNORMAL HIGH (ref 8.7–10.2)
Chloride: 100 mmol/L (ref 96–106)
Creatinine, Ser: 1.21 mg/dL (ref 0.76–1.27)
GFR calc Af Amer: 79 mL/min/{1.73_m2} (ref >59)
GFR calc non Af Amer: 68 mL/min/{1.73_m2} (ref >59)
Globulin, Total: 2.9 g/dL (ref 1.5–4.5)
Glucose: 111 mg/dL — ABNORMAL HIGH (ref 65–99)
Potassium: 4.9 mmol/L (ref 3.5–5.2)
Sodium: 138 mmol/L (ref 134–144)
Total Protein: 7.2 g/dL (ref 6.0–8.5)

## 2019-02-02 ENCOUNTER — Ambulatory Visit: Payer: Self-pay | Admitting: Orthopedic Surgery

## 2019-02-11 ENCOUNTER — Other Ambulatory Visit: Payer: Self-pay

## 2019-02-11 ENCOUNTER — Encounter
Admission: RE | Admit: 2019-02-11 | Discharge: 2019-02-11 | Disposition: A | Discharge status: Home / Self Care | Payer: Medicaid Other | Source: Ambulatory Visit | Attending: Orthopedic Surgery | Admitting: Orthopedic Surgery

## 2019-02-11 DIAGNOSIS — Z01812 Encounter for preprocedural laboratory examination: Secondary | ICD-10-CM | POA: Diagnosis present

## 2019-02-11 HISTORY — DX: Unspecified staphylococcus as the cause of diseases classified elsewhere: B95.8

## 2019-02-11 HISTORY — DX: Unspecified osteoarthritis, unspecified site: M19.90

## 2019-02-11 HISTORY — DX: Type 2 diabetes mellitus without complications: E11.9

## 2019-02-11 HISTORY — DX: Other complications of anesthesia, initial encounter: T88.59XA

## 2019-02-11 HISTORY — DX: Other specified postprocedural states: Z98.890

## 2019-02-11 HISTORY — DX: Other specified postprocedural states: R11.2

## 2019-02-11 LAB — BASIC METABOLIC PANEL
Anion gap: 13 (ref 5–15)
BUN: 13 mg/dL (ref 6–20)
CO2: 22 mmol/L (ref 22–32)
Calcium: 9.4 mg/dL (ref 8.9–10.3)
Chloride: 100 mmol/L (ref 98–111)
Creatinine, Ser: 0.78 mg/dL (ref 0.61–1.24)
GFR calc Af Amer: >60 mL/min (ref >60)
GFR calc non Af Amer: >60 mL/min (ref >60)
Glucose, Bld: 137 mg/dL — ABNORMAL HIGH (ref 70–99)
Potassium: 3.9 mmol/L (ref 3.5–5.1)
Sodium: 135 mmol/L (ref 135–145)

## 2019-02-11 LAB — CBC
HCT: 44.4 % (ref 39.0–52.0)
Hemoglobin: 14.6 g/dL (ref 13.0–17.0)
MCH: 30.8 pg (ref 26.0–34.0)
MCHC: 32.9 g/dL (ref 30.0–36.0)
MCV: 93.7 fL (ref 80.0–100.0)
Platelets: 228 10*3/uL (ref 150–400)
RBC: 4.74 MIL/uL (ref 4.22–5.81)
RDW: 14.4 % (ref 11.5–15.5)
WBC: 7.9 10*3/uL (ref 4.0–10.5)
nRBC: 0 % (ref 0.0–0.2)

## 2019-02-11 LAB — URINALYSIS, ROUTINE W REFLEX MICROSCOPIC
Bilirubin Urine: NEGATIVE
Glucose, UA: NEGATIVE mg/dL
Hgb urine dipstick: NEGATIVE
Ketones, ur: NEGATIVE mg/dL
Leukocytes,Ua: NEGATIVE
Nitrite: NEGATIVE
Protein, ur: NEGATIVE mg/dL
Specific Gravity, Urine: 1.025 (ref 1.005–1.030)
pH: 5 (ref 5.0–8.0)

## 2019-02-11 LAB — SURGICAL PCR SCREEN
MRSA, PCR: NEGATIVE
Staphylococcus aureus: NEGATIVE

## 2019-02-11 LAB — TYPE AND SCREEN
ABO/RH(D): O POS
Antibody Screen: NEGATIVE

## 2019-02-11 LAB — PROTIME-INR
INR: 1 (ref 0.8–1.2)
Prothrombin Time: 12.8 seconds (ref 11.4–15.2)

## 2019-02-11 LAB — APTT: aPTT: 29 seconds (ref 24–36)

## 2019-02-11 NOTE — Patient Instructions (Addendum)
Your procedure is scheduled on: 02-19-19 THURSDAY Report to Same Day Surgery 2nd floor medical mall [facility] Entrance-take elevator on left to 2nd floor.  Check in with surgery information desk.) To find out your arrival time please call [phone] between 1PM - 3PM on 02-18-19 [facility]  Remember: Instructions that are not followed completely may result in serious medical risk, up to and including death, or upon the discretion of your surgeon and anesthesiologist your surgery may need to be rescheduled.    _x___ 1. Do not eat food after midnight the night before your procedure. NO GUM OR CANDY AFTER MIDNIGHT.  You may drink WATER up to 2 hours before you are scheduled to arrive at the hospital for your procedure.  Do not drink WATER within 2 hours of your scheduled arrival to the hospital.  Type 1 and type 2 diabetics should only drink water.   ____Ensure clear carbohydrate drink on the way to the hospital for bariatric patients  _X___G2 clear carbohydrate drink 3 hours before surgery      __x__ 2. No Alcohol for 24 hours before or after surgery.   __x__3. No Smoking or e-cigarettes for 24 prior to surgery.  Do not use any chewable tobacco products for at least 6 hour prior to surgery   ____  4. Bring all medications with you on the day of surgery if instructed.    __x__ 5. Notify your doctor if there is any change in your medical condition     (cold, fever, infections).    x___6. On the morning of surgery brush your teeth with toothpaste and water.  You may rinse your mouth with mouth wash if you wish.  Do not swallow any toothpaste or mouthwash.   Do not wear jewelry, make-up, hairpins, clips or nail polish.  Do not wear lotions, powders, or perfumes. You may wear deodorant.  Do not shave 48 hours prior to surgery. Men may shave face and neck.  Do not bring valuables to the hospital.    [facility] is not responsible for any belongings or valuables.    Contacts, dentures or bridgework may not be worn into surgery.  Leave your suitcase in the car. After surgery it may be brought to your room.  For patients admitted to the hospital, discharge time is determined by your treatment team.  _  Patients discharged the day of surgery will not be allowed to drive home.  You will need someone to drive you home and stay with you the night of your procedure.    Please read over the following fact sheets that you were given:   [facility] Preparing for Surgery and or MRSA Information   _x___ TAKE THE FOLLOWING MEDICATION THE MORNING OF SURGERY WITH A SMALL SIP OF WATER. These include:  1. GABAPENTIN (NEURONTIN)  2.  3.  4.  5.  6.  ____Fleets enema or Magnesium Citrate as directed.   _x___ Use CHG Soap or sage wipes as directed on instruction sheet   ____ Use inhalers on the day of surgery and bring to hospital day of surgery  _X___ Stop Metformin 2 days prior to surgery-LAST DOSE ON Monday, MAY 25TH    ____ Take 1/2 of usual insulin dose the night before surgery and none on the morning surgery.   ____ Follow recommendations from Cardiologist, Pulmonologist or PCP regarding stopping Aspirin, Coumadin, Plavix ,Eliquis, Effient, or Pradaxa, and Pletal.  X____Stop Anti-inflammatories such as Advil, Aleve, Ibuprofen, Motrin, Naproxen, Naprosyn, Goodies  powders or aspirin products NOW- OK to take Tylenol OR PERCOCET IF NEEDED   ____ Stop supplements until after surgery.     ____ Bring C-Pap to the hospital.

## 2019-02-13 ENCOUNTER — Encounter
Admission: RE | Admit: 2019-02-13 | Discharge: 2019-02-13 | Disposition: A | Discharge status: Home / Self Care | Payer: Medicaid Other | Source: Ambulatory Visit | Attending: Orthopedic Surgery | Admitting: Orthopedic Surgery

## 2019-02-13 ENCOUNTER — Other Ambulatory Visit: Payer: Self-pay

## 2019-02-13 DIAGNOSIS — Z1159 Encounter for screening for other viral diseases: Secondary | ICD-10-CM | POA: Diagnosis not present

## 2019-02-13 DIAGNOSIS — Z01812 Encounter for preprocedural laboratory examination: Secondary | ICD-10-CM | POA: Insufficient documentation

## 2019-02-14 LAB — NOVEL CORONAVIRUS, NAA (HOSP ORDER, SEND-OUT TO REF LAB; TAT 18-24 HRS): SARS-CoV-2, NAA: NOT DETECTED

## 2019-02-19 ENCOUNTER — Inpatient Hospital Stay: Payer: Medicaid Other | Admitting: Anesthesiology

## 2019-02-19 ENCOUNTER — Encounter: Payer: Self-pay | Admitting: *Deleted

## 2019-02-19 ENCOUNTER — Other Ambulatory Visit: Payer: Self-pay

## 2019-02-19 ENCOUNTER — Inpatient Hospital Stay
Admission: RE | Admit: 2019-02-19 | Discharge: 2019-02-23 | DRG: 470 | Disposition: A | Discharge status: Home / Self Care | Payer: Medicaid Other | Attending: Orthopedic Surgery | Admitting: Orthopedic Surgery

## 2019-02-19 ENCOUNTER — Inpatient Hospital Stay: Payer: Medicaid Other

## 2019-02-19 ENCOUNTER — Encounter
Admission: RE | Disposition: A | Discharge status: Home / Self Care | Payer: Self-pay | Source: Home / Self Care | Attending: Orthopedic Surgery

## 2019-02-19 DIAGNOSIS — Z888 Allergy status to other drugs, medicaments and biological substances status: Secondary | ICD-10-CM

## 2019-02-19 DIAGNOSIS — Z9049 Acquired absence of other specified parts of digestive tract: Secondary | ICD-10-CM | POA: Diagnosis not present

## 2019-02-19 DIAGNOSIS — G8929 Other chronic pain: Secondary | ICD-10-CM | POA: Diagnosis present

## 2019-02-19 DIAGNOSIS — Z8711 Personal history of peptic ulcer disease: Secondary | ICD-10-CM | POA: Diagnosis not present

## 2019-02-19 DIAGNOSIS — M879 Osteonecrosis, unspecified: Secondary | ICD-10-CM | POA: Diagnosis present

## 2019-02-19 DIAGNOSIS — I10 Essential (primary) hypertension: Secondary | ICD-10-CM | POA: Diagnosis present

## 2019-02-19 DIAGNOSIS — Z823 Family history of stroke: Secondary | ICD-10-CM | POA: Diagnosis not present

## 2019-02-19 DIAGNOSIS — Z7984 Long term (current) use of oral hypoglycemic drugs: Secondary | ICD-10-CM | POA: Diagnosis not present

## 2019-02-19 DIAGNOSIS — Z79891 Long term (current) use of opiate analgesic: Secondary | ICD-10-CM

## 2019-02-19 DIAGNOSIS — M160 Bilateral primary osteoarthritis of hip: Secondary | ICD-10-CM | POA: Diagnosis present

## 2019-02-19 DIAGNOSIS — Z833 Family history of diabetes mellitus: Secondary | ICD-10-CM | POA: Diagnosis not present

## 2019-02-19 DIAGNOSIS — Z881 Allergy status to other antibiotic agents status: Secondary | ICD-10-CM | POA: Diagnosis not present

## 2019-02-19 DIAGNOSIS — E119 Type 2 diabetes mellitus without complications: Secondary | ICD-10-CM | POA: Diagnosis present

## 2019-02-19 DIAGNOSIS — Z923 Personal history of irradiation: Secondary | ICD-10-CM

## 2019-02-19 DIAGNOSIS — Z818 Family history of other mental and behavioral disorders: Secondary | ICD-10-CM

## 2019-02-19 DIAGNOSIS — M25752 Osteophyte, left hip: Secondary | ICD-10-CM | POA: Diagnosis present

## 2019-02-19 DIAGNOSIS — Z8349 Family history of other endocrine, nutritional and metabolic diseases: Secondary | ICD-10-CM

## 2019-02-19 DIAGNOSIS — Z79899 Other long term (current) drug therapy: Secondary | ICD-10-CM

## 2019-02-19 DIAGNOSIS — K219 Gastro-esophageal reflux disease without esophagitis: Secondary | ICD-10-CM | POA: Diagnosis present

## 2019-02-19 DIAGNOSIS — F17210 Nicotine dependence, cigarettes, uncomplicated: Secondary | ICD-10-CM | POA: Diagnosis present

## 2019-02-19 DIAGNOSIS — K5090 Crohn's disease, unspecified, without complications: Secondary | ICD-10-CM | POA: Diagnosis present

## 2019-02-19 DIAGNOSIS — M87852 Other osteonecrosis, left femur: Secondary | ICD-10-CM | POA: Diagnosis present

## 2019-02-19 DIAGNOSIS — Z811 Family history of alcohol abuse and dependence: Secondary | ICD-10-CM

## 2019-02-19 DIAGNOSIS — Z419 Encounter for procedure for purposes other than remedying health state, unspecified: Secondary | ICD-10-CM

## 2019-02-19 DIAGNOSIS — F319 Bipolar disorder, unspecified: Secondary | ICD-10-CM | POA: Diagnosis present

## 2019-02-19 DIAGNOSIS — Z85828 Personal history of other malignant neoplasm of skin: Secondary | ICD-10-CM | POA: Diagnosis not present

## 2019-02-19 HISTORY — PX: TOTAL HIP ARTHROPLASTY: SHX124

## 2019-02-19 LAB — URINE DRUG SCREEN, QUALITATIVE (ARMC ONLY)
Amphetamines, Ur Screen: NOT DETECTED
Barbiturates, Ur Screen: NOT DETECTED
Benzodiazepine, Ur Scrn: NOT DETECTED
Cannabinoid 50 Ng, Ur ~~LOC~~: NOT DETECTED
Cocaine Metabolite,Ur ~~LOC~~: NOT DETECTED
MDMA (Ecstasy)Ur Screen: NOT DETECTED
Methadone Scn, Ur: NOT DETECTED
Opiate, Ur Screen: POSITIVE — AB
Phencyclidine (PCP) Ur S: NOT DETECTED
Tricyclic, Ur Screen: POSITIVE — AB

## 2019-02-19 LAB — GLUCOSE, CAPILLARY
Glucose-Capillary: 141 mg/dL — ABNORMAL HIGH (ref 70–99)
Glucose-Capillary: 168 mg/dL — ABNORMAL HIGH (ref 70–99)
Glucose-Capillary: 192 mg/dL — ABNORMAL HIGH (ref 70–99)
Glucose-Capillary: 212 mg/dL — ABNORMAL HIGH (ref 70–99)

## 2019-02-19 LAB — ABO/RH: ABO/RH(D): O POS

## 2019-02-19 SURGERY — ARTHROPLASTY, HIP, TOTAL, ANTERIOR APPROACH
Anesthesia: General | Laterality: Left

## 2019-02-19 MED ORDER — SODIUM CHLORIDE 0.9 % IV SOLN
INTRAVENOUS | Status: DC
  Administered 2019-02-19 (×2): via INTRAVENOUS

## 2019-02-19 MED ORDER — ACETAMINOPHEN 325 MG PO TABS
325.0000 mg | ORAL_TABLET | Freq: Four times a day (QID) | ORAL | Status: DC | PRN
  Administered 2019-02-21 – 2019-02-23 (×9): 650 mg via ORAL
  Filled 2019-02-19 (×9): qty 2

## 2019-02-19 MED ORDER — SODIUM CHLORIDE 0.9 % IR SOLN
Status: DC | PRN
  Administered 2019-02-19: 14:00:00

## 2019-02-19 MED ORDER — ACETAMINOPHEN 500 MG PO TABS
500.0000 mg | ORAL_TABLET | Freq: Four times a day (QID) | ORAL | Status: AC
  Administered 2019-02-19 – 2019-02-20 (×3): 500 mg via ORAL
  Filled 2019-02-19 (×4): qty 1

## 2019-02-19 MED ORDER — CLINDAMYCIN PHOSPHATE 900 MG/50ML IV SOLN
INTRAVENOUS | Status: AC
  Filled 2019-02-19: qty 50

## 2019-02-19 MED ORDER — MIDAZOLAM HCL 2 MG/2ML IJ SOLN
INTRAMUSCULAR | Status: DC | PRN
  Administered 2019-02-19: 2 mg via INTRAVENOUS

## 2019-02-19 MED ORDER — ONDANSETRON HCL 4 MG/2ML IJ SOLN
INTRAMUSCULAR | Status: DC | PRN
  Administered 2019-02-19: 4 mg via INTRAVENOUS

## 2019-02-19 MED ORDER — HYDROCODONE-ACETAMINOPHEN 7.5-325 MG PO TABS
1.0000 | ORAL_TABLET | ORAL | Status: DC | PRN
  Administered 2019-02-19 – 2019-02-20 (×2): 1 via ORAL
  Filled 2019-02-19 (×2): qty 1

## 2019-02-19 MED ORDER — DEXAMETHASONE SODIUM PHOSPHATE 10 MG/ML IJ SOLN
INTRAMUSCULAR | Status: DC | PRN
  Administered 2019-02-19: 5 mg via INTRAVENOUS

## 2019-02-19 MED ORDER — METOCLOPRAMIDE HCL 5 MG PO TABS
5.0000 mg | ORAL_TABLET | Freq: Three times a day (TID) | ORAL | Status: DC | PRN
  Filled 2019-02-19: qty 2

## 2019-02-19 MED ORDER — QUETIAPINE FUMARATE 200 MG PO TABS
400.0000 mg | ORAL_TABLET | Freq: Every day | ORAL | Status: DC
  Administered 2019-02-19 – 2019-02-22 (×4): 400 mg via ORAL
  Filled 2019-02-19 (×5): qty 2

## 2019-02-19 MED ORDER — LIDOCAINE HCL (CARDIAC) PF 100 MG/5ML IV SOSY
PREFILLED_SYRINGE | INTRAVENOUS | Status: DC | PRN
  Administered 2019-02-19: 80 mg via INTRAVENOUS

## 2019-02-19 MED ORDER — LIDOCAINE HCL (PF) 2 % IJ SOLN
INTRAMUSCULAR | Status: AC
  Filled 2019-02-19: qty 10

## 2019-02-19 MED ORDER — MAGNESIUM HYDROXIDE 400 MG/5ML PO SUSP
30.0000 mL | Freq: Every day | ORAL | Status: DC | PRN
  Filled 2019-02-19: qty 30

## 2019-02-19 MED ORDER — FAMOTIDINE 20 MG PO TABS
20.0000 mg | ORAL_TABLET | Freq: Once | ORAL | Status: AC
  Administered 2019-02-19: 20 mg via ORAL

## 2019-02-19 MED ORDER — ROCURONIUM BROMIDE 50 MG/5ML IV SOLN
INTRAVENOUS | Status: AC
  Filled 2019-02-19: qty 1

## 2019-02-19 MED ORDER — LACTATED RINGERS IV SOLN
INTRAVENOUS | Status: DC
  Administered 2019-02-19: 18:00:00 via INTRAVENOUS

## 2019-02-19 MED ORDER — FENTANYL CITRATE (PF) 100 MCG/2ML IJ SOLN
INTRAMUSCULAR | Status: AC
  Filled 2019-02-19: qty 2

## 2019-02-19 MED ORDER — HYDROMORPHONE HCL 1 MG/ML IJ SOLN
0.2500 mg | INTRAMUSCULAR | Status: AC | PRN
  Administered 2019-02-19 (×8): 0.25 mg via INTRAVENOUS

## 2019-02-19 MED ORDER — DEXAMETHASONE SODIUM PHOSPHATE 10 MG/ML IJ SOLN
INTRAMUSCULAR | Status: AC
  Filled 2019-02-19: qty 1

## 2019-02-19 MED ORDER — RIVAROXABAN 10 MG PO TABS
10.0000 mg | ORAL_TABLET | Freq: Every day | ORAL | Status: DC
  Administered 2019-02-20 – 2019-02-23 (×4): 10 mg via ORAL
  Filled 2019-02-19 (×4): qty 1

## 2019-02-19 MED ORDER — TRANEXAMIC ACID-NACL 1000-0.7 MG/100ML-% IV SOLN
1000.0000 mg | INTRAVENOUS | Status: AC
  Administered 2019-02-19: 13:00:00 1000 mg via INTRAVENOUS
  Filled 2019-02-19: qty 100

## 2019-02-19 MED ORDER — TRANEXAMIC ACID 1000 MG/10ML IV SOLN: INTRAVENOUS | Status: DC | PRN

## 2019-02-19 MED ORDER — CALCIUM CARBONATE ANTACID 500 MG PO CHEW: 1.0000 | CHEWABLE_TABLET | ORAL | Status: DC | PRN

## 2019-02-19 MED ORDER — HYDROMORPHONE HCL 1 MG/ML IJ SOLN
INTRAMUSCULAR | Status: AC
  Administered 2019-02-19: 16:00:00 0.25 mg via INTRAVENOUS
  Filled 2019-02-19: qty 1

## 2019-02-19 MED ORDER — PROPOFOL 500 MG/50ML IV EMUL
INTRAVENOUS | Status: AC
  Filled 2019-02-19: qty 50

## 2019-02-19 MED ORDER — FENTANYL CITRATE (PF) 100 MCG/2ML IJ SOLN
INTRAMUSCULAR | Status: DC | PRN
  Administered 2019-02-19 (×7): 50 ug via INTRAVENOUS

## 2019-02-19 MED ORDER — METFORMIN HCL 500 MG PO TABS
500.0000 mg | ORAL_TABLET | Freq: Two times a day (BID) | ORAL | Status: DC
  Administered 2019-02-20 – 2019-02-23 (×7): 500 mg via ORAL
  Filled 2019-02-19 (×7): qty 1

## 2019-02-19 MED ORDER — CLINDAMYCIN PHOSPHATE 900 MG/50ML IV SOLN
900.0000 mg | INTRAVENOUS | Status: AC
  Administered 2019-02-19: 13:00:00 900 mg via INTRAVENOUS

## 2019-02-19 MED ORDER — MENTHOL 3 MG MT LOZG
1.0000 | LOZENGE | OROMUCOSAL | Status: DC | PRN
  Filled 2019-02-19: qty 9

## 2019-02-19 MED ORDER — SODIUM CHLORIDE FLUSH 0.9 % IV SOLN
INTRAVENOUS | Status: AC
  Filled 2019-02-19: qty 20

## 2019-02-19 MED ORDER — LACTATED RINGERS IV SOLN: INTRAVENOUS | Status: DC

## 2019-02-19 MED ORDER — SUCCINYLCHOLINE CHLORIDE 20 MG/ML IJ SOLN
INTRAMUSCULAR | Status: AC
  Filled 2019-02-19: qty 1

## 2019-02-19 MED ORDER — MIDAZOLAM HCL 2 MG/2ML IJ SOLN
INTRAMUSCULAR | Status: AC
  Filled 2019-02-19: qty 2

## 2019-02-19 MED ORDER — HYDROCODONE-ACETAMINOPHEN 5-325 MG PO TABS
1.0000 | ORAL_TABLET | ORAL | Status: DC | PRN
  Administered 2019-02-19: 2 via ORAL
  Administered 2019-02-19: 1 via ORAL
  Administered 2019-02-20 (×2): 2 via ORAL
  Filled 2019-02-19: qty 1
  Filled 2019-02-19 (×4): qty 2

## 2019-02-19 MED ORDER — ALBUTEROL SULFATE HFA 108 (90 BASE) MCG/ACT IN AERS
INHALATION_SPRAY | RESPIRATORY_TRACT | Status: DC | PRN
  Administered 2019-02-19: 12 via RESPIRATORY_TRACT

## 2019-02-19 MED ORDER — PROPOFOL 10 MG/ML IV BOLUS
INTRAVENOUS | Status: DC | PRN
  Administered 2019-02-19: 50 mg via INTRAVENOUS
  Administered 2019-02-19: 120 mg via INTRAVENOUS
  Administered 2019-02-19: 30 mg via INTRAVENOUS

## 2019-02-19 MED ORDER — ONDANSETRON HCL 4 MG/2ML IJ SOLN
4.0000 mg | Freq: Four times a day (QID) | INTRAMUSCULAR | Status: DC | PRN
  Administered 2019-02-21: 4 mg via INTRAVENOUS
  Filled 2019-02-19: qty 2

## 2019-02-19 MED ORDER — ONDANSETRON HCL 4 MG PO TABS: 4.0000 mg | ORAL_TABLET | Freq: Four times a day (QID) | ORAL | Status: DC | PRN

## 2019-02-19 MED ORDER — SUCCINYLCHOLINE CHLORIDE 20 MG/ML IJ SOLN
INTRAMUSCULAR | Status: DC | PRN
  Administered 2019-02-19: 120 mg via INTRAVENOUS

## 2019-02-19 MED ORDER — METOCLOPRAMIDE HCL 5 MG/ML IJ SOLN: 5.0000 mg | Freq: Three times a day (TID) | INTRAMUSCULAR | Status: DC | PRN

## 2019-02-19 MED ORDER — ONDANSETRON HCL 4 MG/2ML IJ SOLN
INTRAMUSCULAR | Status: AC
  Filled 2019-02-19: qty 2

## 2019-02-19 MED ORDER — POVIDONE-IODINE 10 % EX SWAB
2.0000 "application " | Freq: Once | CUTANEOUS | Status: AC
  Administered 2019-02-19: 2 via TOPICAL

## 2019-02-19 MED ORDER — GABAPENTIN 600 MG PO TABS
600.0000 mg | ORAL_TABLET | Freq: Three times a day (TID) | ORAL | Status: DC
  Administered 2019-02-19 – 2019-02-23 (×11): 600 mg via ORAL
  Filled 2019-02-19 (×14): qty 1

## 2019-02-19 MED ORDER — TRAMADOL HCL 50 MG PO TABS
50.0000 mg | ORAL_TABLET | Freq: Four times a day (QID) | ORAL | Status: DC
  Administered 2019-02-19 – 2019-02-22 (×8): 50 mg via ORAL
  Filled 2019-02-19 (×8): qty 1

## 2019-02-19 MED ORDER — FENTANYL CITRATE (PF) 250 MCG/5ML IJ SOLN
INTRAMUSCULAR | Status: AC
  Filled 2019-02-19: qty 5

## 2019-02-19 MED ORDER — MAGNESIUM CITRATE PO SOLN
1.0000 | Freq: Once | ORAL | Status: DC | PRN
  Filled 2019-02-19: qty 296

## 2019-02-19 MED ORDER — BISACODYL 10 MG RE SUPP: 10.0000 mg | Freq: Every day | RECTAL | Status: DC | PRN

## 2019-02-19 MED ORDER — BUPIVACAINE-EPINEPHRINE (PF) 0.25% -1:200000 IJ SOLN
INTRAMUSCULAR | Status: DC | PRN
  Administered 2019-02-19: 20 mL

## 2019-02-19 MED ORDER — FAMOTIDINE 20 MG PO TABS
ORAL_TABLET | ORAL | Status: AC
  Administered 2019-02-19: 11:00:00 20 mg via ORAL
  Filled 2019-02-19: qty 1

## 2019-02-19 MED ORDER — ACETAMINOPHEN 500 MG PO TABS
1000.0000 mg | ORAL_TABLET | Freq: Once | ORAL | Status: AC
  Administered 2019-02-19: 1000 mg via ORAL

## 2019-02-19 MED ORDER — MORPHINE SULFATE (PF) 2 MG/ML IV SOLN
0.5000 mg | INTRAVENOUS | Status: DC | PRN
  Administered 2019-02-20 – 2019-02-21 (×3): 1 mg via INTRAVENOUS
  Filled 2019-02-19 (×3): qty 1

## 2019-02-19 MED ORDER — CLINDAMYCIN PHOSPHATE 600 MG/50ML IV SOLN
600.0000 mg | Freq: Four times a day (QID) | INTRAVENOUS | Status: AC
  Administered 2019-02-19 – 2019-02-20 (×2): 600 mg via INTRAVENOUS
  Filled 2019-02-19 (×2): qty 50

## 2019-02-19 MED ORDER — ACETAMINOPHEN 325 MG PO TABS
ORAL_TABLET | ORAL | Status: AC
  Administered 2019-02-19: 11:00:00 1000 mg via ORAL
  Filled 2019-02-19: qty 3

## 2019-02-19 MED ORDER — ROCURONIUM BROMIDE 100 MG/10ML IV SOLN
INTRAVENOUS | Status: DC | PRN
  Administered 2019-02-19 (×2): 20 mg via INTRAVENOUS
  Administered 2019-02-19: 30 mg via INTRAVENOUS

## 2019-02-19 MED ORDER — SUGAMMADEX SODIUM 500 MG/5ML IV SOLN
INTRAVENOUS | Status: DC | PRN
  Administered 2019-02-19: 408.8 mg via INTRAVENOUS

## 2019-02-19 MED ORDER — PHENOL 1.4 % MT LIQD
1.0000 | OROMUCOSAL | Status: DC | PRN
  Filled 2019-02-19: qty 177

## 2019-02-19 MED ORDER — DOCUSATE SODIUM 100 MG PO CAPS
100.0000 mg | ORAL_CAPSULE | Freq: Two times a day (BID) | ORAL | Status: DC
  Administered 2019-02-19 – 2019-02-23 (×5): 100 mg via ORAL
  Filled 2019-02-19 (×6): qty 1

## 2019-02-19 MED ORDER — INSULIN ASPART 100 UNIT/ML ~~LOC~~ SOLN
0.0000 [IU] | Freq: Three times a day (TID) | SUBCUTANEOUS | Status: DC
  Administered 2019-02-19: 17:00:00 3 [IU] via SUBCUTANEOUS
  Administered 2019-02-20 (×2): 2 [IU] via SUBCUTANEOUS
  Administered 2019-02-20: 12:00:00 3 [IU] via SUBCUTANEOUS
  Administered 2019-02-21 (×2): 2 [IU] via SUBCUTANEOUS
  Administered 2019-02-21: 3 [IU] via SUBCUTANEOUS
  Administered 2019-02-22 (×2): 2 [IU] via SUBCUTANEOUS
  Administered 2019-02-23: 3 [IU] via SUBCUTANEOUS
  Filled 2019-02-19 (×10): qty 1

## 2019-02-19 MED ORDER — CHLORHEXIDINE GLUCONATE 4 % EX LIQD: 60.0000 mL | Freq: Once | CUTANEOUS | Status: DC

## 2019-02-19 SURGICAL SUPPLY — 47 items
BLADE SAGITTAL WIDE XTHICK NO (BLADE) ×3 IMPLANT
BRUSH SCRUB EZ  4% CHG (MISCELLANEOUS) ×4
BRUSH SCRUB EZ 4% CHG (MISCELLANEOUS) ×2 IMPLANT
CHLORAPREP W/TINT 26 (MISCELLANEOUS) ×3 IMPLANT
COVER HOLE (Hips) ×3 IMPLANT
COVER WAND RF STERILE (DRAPES) ×3 IMPLANT
CUP R3 52MM (Hips) ×3 IMPLANT
DRAPE C-ARM 42X72 X-RAY (DRAPES) ×3 IMPLANT
DRAPE SHEET LG 3/4 BI-LAMINATE (DRAPES) ×3 IMPLANT
DRAPE STERI IOBAN 125X83 (DRAPES) IMPLANT
DRSG AQUACEL AG ADV 3.5X10 (GAUZE/BANDAGES/DRESSINGS) IMPLANT
DRSG AQUACEL AG ADV 3.5X14 (GAUZE/BANDAGES/DRESSINGS) IMPLANT
ELECT BLADE 6.5 EXT (BLADE) ×3 IMPLANT
ELECT REM PT RETURN 9FT ADLT (ELECTROSURGICAL) ×3
ELECTRODE REM PT RTRN 9FT ADLT (ELECTROSURGICAL) ×1 IMPLANT
GAUZE XEROFORM 1X8 LF (GAUZE/BANDAGES/DRESSINGS) IMPLANT
GLOVE INDICATOR 8.0 STRL GRN (GLOVE) ×3 IMPLANT
GLOVE SURG ORTHO 8.0 STRL STRW (GLOVE) ×6 IMPLANT
GOWN STRL REUS W/ TWL LRG LVL3 (GOWN DISPOSABLE) ×2 IMPLANT
GOWN STRL REUS W/ TWL XL LVL3 (GOWN DISPOSABLE) ×1 IMPLANT
GOWN STRL REUS W/TWL LRG LVL3 (GOWN DISPOSABLE) ×4
GOWN STRL REUS W/TWL XL LVL3 (GOWN DISPOSABLE) ×2
HEAD FEMORAL TAPER 36MM P0 (Head) ×3 IMPLANT
HOOD PEEL AWAY FLYTE STAYCOOL (MISCELLANEOUS) ×9 IMPLANT
IV NS 1000ML (IV SOLUTION) ×2
IV NS 1000ML BAXH (IV SOLUTION) ×1 IMPLANT
KIT PATIENT CARE HANA TABLE (KITS) ×3 IMPLANT
KIT TURNOVER CYSTO (KITS) ×3 IMPLANT
LINER ACETAB 0 DEG (Liner) ×3 IMPLANT
MAT ABSORB  FLUID 56X50 GRAY (MISCELLANEOUS) ×2
MAT ABSORB FLUID 56X50 GRAY (MISCELLANEOUS) ×1 IMPLANT
NDL SAFETY ECLIPSE 18X1.5 (NEEDLE) ×2 IMPLANT
NEEDLE HYPO 18GX1.5 SHARP (NEEDLE) ×4
NEEDLE HYPO 22GX1.5 SAFETY (NEEDLE) ×3 IMPLANT
NEEDLE SPNL 20GX3.5 QUINCKE YW (NEEDLE) ×3 IMPLANT
PACK HIP PROSTHESIS (MISCELLANEOUS) ×3 IMPLANT
PADDING CAST BLEND 4X4 NS (MISCELLANEOUS) ×6 IMPLANT
PILLOW ABDUCTION MEDIUM (MISCELLANEOUS) ×3 IMPLANT
PULSAVAC PLUS IRRIG FAN TIP (DISPOSABLE) ×3
STAPLER SKIN PROX 35W (STAPLE) ×3 IMPLANT
STEM STD COLLAR SZ7 POLARSTEM (Stem) ×3 IMPLANT
SUT BONE WAX W31G (SUTURE) ×3 IMPLANT
SUT DVC 2 QUILL PDO  T11 36X36 (SUTURE) ×2
SUT DVC 2 QUILL PDO T11 36X36 (SUTURE) ×1 IMPLANT
SUT VIC AB 2-0 CT1 18 (SUTURE) ×3 IMPLANT
SYR 20CC LL (SYRINGE) ×3 IMPLANT
TIP FAN IRRIG PULSAVAC PLUS (DISPOSABLE) ×1 IMPLANT

## 2019-02-19 NOTE — Anesthesia Post-op Follow-up Note (Signed)
Anesthesia QCDR form completed.        

## 2019-02-19 NOTE — Anesthesia Procedure Notes (Signed)
Procedure Name: Intubation Performed by: Vaughan Sine Pre-anesthesia Checklist: Patient identified, Emergency Drugs available, Suction available, Patient being monitored and Timeout performed Patient Re-evaluated:Patient Re-evaluated prior to induction Oxygen Delivery Method: Circle system utilized Preoxygenation: Pre-oxygenation with 100% oxygen Induction Type: IV induction Ventilation: Mask ventilation without difficulty Laryngoscope Size: McGraph and 4 Grade View: Grade II Tube type: Oral Tube size: 7.5 mm Number of attempts: 1 Airway Equipment and Method: Stylet Placement Confirmation: ETT inserted through vocal cords under direct vision,  positive ETCO2,  CO2 detector and breath sounds checked- equal and bilateral

## 2019-02-19 NOTE — Transfer of Care (Signed)
Immediate Anesthesia Transfer of Care Note  Patient: NIKOLA MARONE  Procedure(s) Performed: TOTAL HIP ARTHROPLASTY ANTERIOR APPROACH (Left )  Patient Location: PACU  Anesthesia Type:General  Level of Consciousness: awake, alert , oriented and sedated  Airway & Oxygen Therapy: Patient Spontanous Breathing and Patient connected to face mask oxygen  Post-op Assessment: Report given to RN and Post -op Vital signs reviewed and stable  Post vital signs: Reviewed and stable  Last Vitals:  Vitals Value Taken Time  BP    Temp    Pulse    Resp    SpO2      Last Pain:  Vitals:   02/19/19 1049  TempSrc: Oral  PainSc: 10-Worst pain ever         Complications: No apparent anesthesia complications
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
## 2019-02-19 NOTE — Anesthesia Postprocedure Evaluation (Signed)
Anesthesia Post Note  Patient: EATHON VALADE  Procedure(s) Performed: TOTAL HIP ARTHROPLASTY ANTERIOR APPROACH (Left )  Patient location during evaluation: PACU Anesthesia Type: General Level of consciousness: awake and alert Pain management: pain level controlled Vital Signs Assessment: post-procedure vital signs reviewed and stable Respiratory status: spontaneous breathing, nonlabored ventilation, respiratory function stable and patient connected to nasal cannula oxygen Cardiovascular status: blood pressure returned to baseline and stable Postop Assessment: no apparent nausea or vomiting Anesthetic complications: no     Last Vitals:  Vitals:   02/19/19 1623 02/19/19 1643  BP: (!) 154/108 (!) 157/102  Pulse: (!) 107 (!) 106  Resp: 18 18  Temp: 36.7 C   SpO2: 96% 98%    Last Pain:  Vitals:   02/19/19 1643  TempSrc:   PainSc: 8                  Martha Clan

## 2019-02-19 NOTE — Op Note (Signed)
02/19/2019  3:39 PM  PATIENT:  Joshua Mays   MRN: [id number]  PRE-OPERATIVE DIAGNOSIS:  Osteonecrosis hip   POST-OPERATIVE DIAGNOSIS: Same  Procedure: Left Total Hip Replacement  Surgeon: Elyn Aquas. Harlow Mares, MD   Assist: None  Anesthesia: Spinal   EBL: 400 mL   Specimens: None   Drains: None   Components used: A size 7 Polarstem [employer]hew, R3 size 52 mm shell, and a 36 mm +0 mm head    Description of the procedure in detail: After informed consent was obtained and the appropriate extremity marked in the pre-operative holding area, the patient was taken to the operating room and placed in the supine position on the fracture table. All pressure points were well padded and bilateral lower extremities were place in traction spars. The hip was prepped and draped in standard sterile fashion. A spinal anesthetic had been delivered by the anesthesia team. The skin and subcutaneous tissues were injected with a mixture of Marcaine with epinephrine for post-operative pain. A longitudinal incision approximately 10 cm in length was carried out from the anterior superior iliac spine to the greater trochanter. The tensor fascia was divided and blunt dissection was taken down to the level of the joint capsule. The lateral circumflex vessels were cauterized. Deep retractors were placed and a portion of the anterior capsule was excised. Using fluoroscopy the neck cut was planned and carried out with a sagittal saw. The head was passed from the field with use of a corkscrew and hip skid. Deep retractors were placed along the acetabulum and the degenerative labrum and large osteophytes were removed with a Rongeur. The cup was sequentially reamed to a size 52 mm. The wound was irrigated and using fluoroscopy the size 52 mm cup was impacted in to anatomic position. A single screw was placed followed by a threaded hole cover. The final liner was impacted in to position. Attention was then turned to the  proximal femur. The leg was placed in extension and external rotation. The canal was opened and sequentially broached to a size 7. The trial components were placed and the hip relocated. The components were found to be in good position using fluoroscopy. The hip was dislocated and the trial components removed. The final components were impacted in to position and the hip relocated. The final components were again check with fluoroscopy and found to be in good position. Hemostasis was achieved with electrocautery. The deep capsule was injected with Marcaine and epinephrine. The wound was irrigated with bacitracin laced normal saline and the tensor fascia closed with #2 Quill suture. The subcutaneous tissues were closed with 2-0 vicryl and staples for the skin. A sterile dressing was applied and an abduction pillow. Patient tolerated the procedure well and there were no apparent complication. Patient was taken to the recovery room in good condition.   Kurtis Bushman, MD
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
## 2019-02-20 ENCOUNTER — Encounter: Payer: Self-pay | Admitting: Orthopedic Surgery

## 2019-02-20 LAB — BASIC METABOLIC PANEL
Anion gap: 8 (ref 5–15)
BUN: 11 mg/dL (ref 6–20)
CO2: 23 mmol/L (ref 22–32)
Calcium: 7.8 mg/dL — ABNORMAL LOW (ref 8.9–10.3)
Chloride: 105 mmol/L (ref 98–111)
Creatinine, Ser: 0.98 mg/dL (ref 0.61–1.24)
GFR calc Af Amer: >60 mL/min (ref >60)
GFR calc non Af Amer: >60 mL/min (ref >60)
Glucose, Bld: 209 mg/dL — ABNORMAL HIGH (ref 70–99)
Potassium: 4.1 mmol/L (ref 3.5–5.1)
Sodium: 136 mmol/L (ref 135–145)

## 2019-02-20 LAB — GLUCOSE, CAPILLARY
Glucose-Capillary: 149 mg/dL — ABNORMAL HIGH (ref 70–99)
Glucose-Capillary: 160 mg/dL — ABNORMAL HIGH (ref 70–99)
Glucose-Capillary: 149 mg/dL — ABNORMAL HIGH (ref 70–99)
Glucose-Capillary: 181 mg/dL — ABNORMAL HIGH (ref 70–99)

## 2019-02-20 LAB — CBC
HCT: 32 % — ABNORMAL LOW (ref 39.0–52.0)
Hemoglobin: 10.5 g/dL — ABNORMAL LOW (ref 13.0–17.0)
MCH: 31.7 pg (ref 26.0–34.0)
MCHC: 32.8 g/dL (ref 30.0–36.0)
MCV: 96.7 fL (ref 80.0–100.0)
Platelets: 125 10*3/uL — ABNORMAL LOW (ref 150–400)
RBC: 3.31 MIL/uL — ABNORMAL LOW (ref 4.22–5.81)
RDW: 14.6 % (ref 11.5–15.5)
WBC: 8.7 10*3/uL (ref 4.0–10.5)
nRBC: 0 % (ref 0.0–0.2)

## 2019-02-20 MED ORDER — OXYCODONE HCL 5 MG PO TABS
5.0000 mg | ORAL_TABLET | ORAL | Status: DC | PRN
  Administered 2019-02-20: 15:00:00 10 mg via ORAL
  Filled 2019-02-20: qty 2

## 2019-02-20 MED ORDER — OXYCODONE HCL 5 MG PO TABS
5.0000 mg | ORAL_TABLET | ORAL | Status: DC | PRN
  Administered 2019-02-21 (×2): 5 mg via ORAL
  Filled 2019-02-20 (×2): qty 1

## 2019-02-20 NOTE — Progress Notes (Signed)
Subjective:  Patient reports pain as moderate.    Objective:   VITALS:   Vitals:   02/20/19 0536 02/20/19 0749 02/20/19 1133 02/20/19 1528  BP: 132/84 (!) 126/94 (!) 132/97 129/89  Pulse: (!) 109 (!) 104 (!) 105 99  Resp: 19     Temp: 98.3 F (36.8 C) 98.2 F (36.8 C) 98.2 F (36.8 C) 98.3 F (36.8 C)  TempSrc: Oral Oral Oral Oral  SpO2: 95% 97% 96% 95%  Weight:      Height:        PHYSICAL EXAM:  ABD soft Neurovascular intact Sensation intact distally Dorsiflexion/Plantar flexion intact Incision: dressing C/D/I Compartment soft  LABS  Results for orders placed or performed during the hospital encounter of 02/19/19 (from the past 24 hour(s))  Glucose, capillary     Status: Abnormal   Collection Time: 02/19/19  9:34 PM  Result Value Ref Range   Glucose-Capillary 212 (H) 70 - 99 mg/dL  CBC     Status: Abnormal   Collection Time: 02/20/19  4:53 AM  Result Value Ref Range   WBC 8.7 4.0 - 10.5 K/uL   RBC 3.31 (L) 4.22 - 5.81 MIL/uL   Hemoglobin 10.5 (L) 13.0 - 17.0 g/dL   HCT 32.0 (L) 39.0 - 52.0 %   MCV 96.7 80.0 - 100.0 fL   MCH 31.7 26.0 - 34.0 pg   MCHC 32.8 30.0 - 36.0 g/dL   RDW 14.6 11.5 - 15.5 %   Platelets 125 (L) 150 - 400 K/uL   nRBC 0.0 0.0 - 0.2 %  Basic metabolic panel     Status: Abnormal   Collection Time: 02/20/19  4:53 AM  Result Value Ref Range   Sodium 136 135 - 145 mmol/L   Potassium 4.1 3.5 - 5.1 mmol/L   Chloride 105 98 - 111 mmol/L   CO2 23 22 - 32 mmol/L   Glucose, Bld 209 (H) 70 - 99 mg/dL   BUN 11 6 - 20 mg/dL   Creatinine, Ser 0.98 0.61 - 1.24 mg/dL   Calcium 7.8 (L) 8.9 - 10.3 mg/dL   GFR calc non Af Amer >60 >60 mL/min   GFR calc Af Amer >60 >60 mL/min   Anion gap 8 5 - 15  Glucose, capillary     Status: Abnormal   Collection Time: 02/20/19  7:47 AM  Result Value Ref Range   Glucose-Capillary 149 (H) 70 - 99 mg/dL  Glucose, capillary     Status: Abnormal   Collection Time: 02/20/19 11:34 AM  Result Value Ref Range   Glucose-Capillary 160 (H) 70 - 99 mg/dL  Glucose, capillary     Status: Abnormal   Collection Time: 02/20/19  4:35 PM  Result Value Ref Range   Glucose-Capillary 149 (H) 70 - 99 mg/dL    Dg Hip Operative Unilat W Or W/o Pelvis Left  Result Date: 02/19/2019 CLINICAL DATA:  Left hip replacement EXAM: OPERATIVE left HIP (WITH PELVIS IF PERFORMED) 2 VIEWS TECHNIQUE: Fluoroscopic spot image(s) were submitted for interpretation post-operatively. COMPARISON:  09/23/2018 FINDINGS: Total hip replacement on the left in satisfactory position alignment. No media complication. IMPRESSION: Satisfactory left hip replacement Electronically Signed   By: Franchot Gallo M.D.   On: 02/19/2019 15:14    Assessment/Plan: 1 Day Post-Op   Active Problems:   Osteonecrosis of left hip ([place])   Up with therapy Plan for discharge tomorrow  Oxycodone for pain   Lovell Sheehan , MD 02/20/2019, 7:59 PM

## 2019-02-20 NOTE — Progress Notes (Signed)
Pt burrating this writer regarding pain medication and dressing change. Education given that nurse may not change dressing without MD order and pain medication, he has exceeded tylenol limit in 24 hour period. Call to surgeon(Bowers) to address the previous, at the time in a procedure.

## 2019-02-20 NOTE — Progress Notes (Signed)
Patient refused bed and chair alarm. Education given but refused.

## 2019-02-20 NOTE — Progress Notes (Signed)
MD notified of breakthrough bleeding. MD acknowledged. No new orders.

## 2019-02-20 NOTE — TOC Initial Note (Signed)
Transition of Care [facility]) - Initial/Assessment Note    Patient Details  Name: Joshua Mays MRN: [id number] Date of Birth: September 05, 1966  [facility]) CM/SW Contact:    Su Hilt, RN Phone Number: 02/20/2019, 3:46 PM  Clinical Narrative:                 Met with the patient to discuss DC plan and needs, he lives in an apartment alojne and has a little dog, he still drives and does not qualify for [facility], due to not being home bound He plans to go stay with his mother for as few weeks and go to outpaitent Physical Therapy at [facility], he will call Monday for an appointment He stated that his mother will drive him to his appointments until he is clear to drive  His mother is at [place] He needs a RW at home, I Designer, fashion/clothing with [employer] of the need and possible DC over the weekend The patient states that he has no other needs   Expected Discharge Plan: OP Rehab Barriers to Discharge: Continued Medical Work up   Patient Goals and CMS Choice Patient states their goals for this hospitalization and ongoing recovery are:: go to his moms at [place]      Expected Discharge Plan and Services Expected Discharge Plan: OP Rehab   Discharge Planning Services: CM Consult   Living arrangements for the past 2 months: Apartment(will be staying in a one story house with his mom until he is better)                 DME Arranged: Walker rolling DME Agency: [employer] Date DME Agency Contacted: 02/20/19 Time DME Agency Contacted: [phone] Representative spoke with at DME Agency: Leroy Sea) [place] Arranged: Refused HH          Prior Living Arrangements/Services Living arrangements for the past 2 months: Apartment(will be staying in a one story house with his mom until he is better) Lives with:: Self Patient language and need for interpreter reviewed:: Yes Do you feel safe going back to the place where you live?: Yes      Need for Family Participation in  Patient Care: No (Comment) Care giver support system in place?: Yes (comment) Current home services: DME(cane) Criminal Activity/Legal Involvement Pertinent to Current Situation/Hospitalization: No - Comment as needed  Activities of Daily Living Home Assistive Devices/Equipment: Cane (specify quad or straight) ADL Screening (condition at time of admission) Patient's cognitive ability adequate to safely complete daily activities?: Yes Is the patient deaf or have difficulty hearing?: No Does the patient have difficulty seeing, even when wearing glasses/contacts?: No Does the patient have difficulty concentrating, remembering, or making decisions?: No Patient able to express need for assistance with ADLs?: Yes Does the patient have difficulty dressing or bathing?: No Independently performs ADLs?: Yes (appropriate for developmental age) Does the patient have difficulty walking or climbing stairs?: Yes Weakness of Legs: Left Weakness of Arms/Hands: None  Permission Sought/Granted                  Emotional Assessment Appearance:: Appears stated age Attitude/Demeanor/Rapport: Engaged Affect (typically observed): Accepting Orientation: : Oriented to Self, Oriented to Place, Oriented to  Time, Oriented to Situation Alcohol / Substance Use: Tobacco Use Psych Involvement: No (comment)  Admission diagnosis:  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 Osteonecrosis of left hip ([place]) 02/19/2019  . Type 2 diabetes mellitus  without complications ([place]) 20/81/3887  . Pain of left hip joint 11/21/2018  . Low back pain 10/29/2018  . Long term current use of opiate analgesic 05/20/2017  . Long term prescription opiate use 05/20/2017  . Opiate use 05/20/2017  . Chronic pain syndrome 05/20/2017  . Chronic hand pain (Primary Area of Pain)( Right) 05/20/2017  . Chronic upper extremity pain (Secondary Area of Pain)(right) 05/20/2017  . Paresthesia of  right upper extremity 05/20/2017  . 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 H/O ventral hernia repair 12/13/2015  . History of ileostomy 12/13/2015  . 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 Peristomal hernia 12/09/2015  . Postoperative wound infection 07/16/2015  . Bipolar I disorder, most recent episode depressed ([place]) 07/01/2015  . Borderline personality disorder ([place]) 07/01/2015  . Alcohol abuse 07/01/2015  . Wound infection after surgery 06/25/2015  . Post-operative wound abscess 06/23/2015  . Ventral hernia 06/13/2015  . 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 Hernia, umbilical 19/59/7471  . Affective bipolar disorder ([place]) 05/18/2015  . Chronic pain 05/18/2015  . CC (Crohn's colitis) ([place]) 05/18/2015  . Clinical depression 05/18/2015  . Failure of erection 05/18/2015  . 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 Personal history of urinary disorder 05/18/2015  . H/O malignant neoplasm 05/18/2015  . H/O: substance abuse ([place]) 05/18/2015  . Personal history of arthritis 05/18/2015  . BP (high blood pressure) 05/18/2015  . 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 Colitis gravis ([place]) 05/18/2015  . B12 deficiency 05/18/2015  . 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

## 2019-02-20 NOTE — Progress Notes (Signed)
Patient dressing reinforced again. Patient is frequently up and down to the side of the to urinate.

## 2019-02-20 NOTE — Evaluation (Signed)
Physical Therapy Evaluation Patient Details Name: Joshua Mays MRN: [id number] DOB: Aug 12, 1966 Today's Date: 02/20/2019   History of Present Illness  Joshua "Joshua Kindred" Mays is a 53yo male who comes to [facility] for elective THA after several months of pain and disability related to femoral necrosis.   Clinical Impression  Pt admitted with above diagnosis. Pt currently with functional limitations due to the deficits listed below (see "PT Problem List"). Upon entry, pt in bed, awake and agreeable to participate. Voices discontent that nursing has not allowed him to mobilize for ostomy management.supervision for bed mobility and transfers, minGuard assist for 2 AMB bouts. Pt educated on exercises in bed and gait training content. Functional mobility assessment demonstrates increased effort/time requirements, poor tolerance, and need for physical assistance, whereas the patient performed these at a higher level of independence PTA. Stairs training pending, but anticipate no difficulty with this d/t long-standing pain in hip PTA. Pt will benefit from skilled PT intervention to increase independence and safety with basic mobility in preparation for discharge to the venue listed below.       Follow Up Recommendations Home health PT    Equipment Recommendations  Rolling walker with 5" wheels    Recommendations for Other Services       Precautions / Restrictions Precautions Precautions: Fall Restrictions Weight Bearing Restrictions: Yes LLE Weight Bearing: Weight bearing as tolerated      Mobility  Bed Mobility Overal bed mobility: Modified Independent             General bed mobility comments: moderate effort, but able to perform   Transfers Overall transfer level: Needs assistance Equipment used: Rolling walker (2 wheeled) Transfers: Sit to/from Stand Sit to Stand: Supervision         General transfer comment: VC for technique and safety  Ambulation/Gait Ambulation/Gait  assistance: Min guard Gait Distance (Feet): 160 Feet Assistive device: Rolling walker (2 wheeled) Gait Pattern/deviations: Step-to pattern;Step-through pattern     General Gait Details: VC to refrain from LLE abducted gait, and to move toward a 2-point gait with RW (continuous push); 22ft, then 181ft   Stairs            Wheelchair Mobility    Modified Rankin (Stroke Patients Only)       Balance Overall balance assessment: Mild deficits observed, not formally tested;Needs assistance Sitting-balance support: Feet supported;No upper extremity supported Sitting balance-Leahy Scale: Normal     Standing balance support: During functional activity;Bilateral upper extremity supported Standing balance-Leahy Scale: Good                               Pertinent Vitals/Pain Pain Assessment: 0-10 Pain Score: 5  Pain Location: Operative site  Pain Descriptors / Indicators: Operative site guarding Pain Intervention(s): Limited activity within patient's tolerance    Home Living Family/patient expects to be discharged to:: Other (Comment)(Mom's home) Living Arrangements: Alone Available Help at Discharge: Family(Mom) Type of Home: House Home Access: Stairs to enter Entrance Stairs-Rails: Right Entrance Stairs-Number of Steps: 2 Home Layout: One level Home Equipment: Cane - single point      Prior Function Level of Independence: Independent         Comments: Independent; has ileostomy.      Hand Dominance   Dominant Hand: Right    Extremity/Trunk Assessment   Upper Extremity Assessment Upper Extremity Assessment: Overall WFL for tasks assessed    Lower Extremity Assessment Lower Extremity Assessment: Overall  WFL for tasks assessed       Communication   Communication: No difficulties  Cognition Arousal/Alertness: Awake/alert Behavior During Therapy: WFL for tasks assessed/performed Overall Cognitive Status: Within Functional Limits for tasks  assessed                                        General Comments      Exercises Total Joint Exercises Ankle Circles/Pumps: AROM;15 reps;Both;Supine Quad Sets: AROM;Supine;Left;10 reps Heel Slides: AAROM;Supine;Left;10 reps Hip ABduction/ADduction: AAROM;Supine;Left;10 reps   Assessment/Plan    PT Assessment Patient needs continued PT services  PT Problem List Decreased strength;Decreased range of motion;Decreased activity tolerance;Decreased balance;Decreased mobility       PT Treatment Interventions DME instruction;Therapeutic exercise;Gait training;Stair training;Functional mobility training;Therapeutic activities;Balance training    PT Goals (Current goals can be found in the Care Plan section)  Acute Rehab PT Goals Patient Stated Goal: DC to mother's home and progress strength PT Goal Formulation: With patient Time For Goal Achievement: 03/06/19 Potential to Achieve Goals: Fair    Frequency BID   Barriers to discharge        Co-evaluation               AM-PAC PT "6 Clicks" Mobility  Outcome Measure Help needed turning from your back to your side while in a flat bed without using bedrails?: A Little Help needed moving from lying on your back to sitting on the side of a flat bed without using bedrails?: A Little Help needed moving to and from a bed to a chair (including a wheelchair)?: A Little Help needed standing up from a chair using your arms (e.g., wheelchair or bedside chair)?: A Little Help needed to walk in hospital room?: A Little Help needed climbing 3-5 steps with a railing? : A Little 6 Click Score: 18    End of Session Equipment Utilized During Treatment: Gait belt Activity Tolerance: Patient tolerated treatment well Patient left: in chair;with chair alarm set;with call bell/phone within reach Nurse Communication: Mobility status PT Visit Diagnosis: Unsteadiness on feet (R26.81);Other abnormalities of gait and mobility  (R26.89);Difficulty in walking, not elsewhere classified (R26.2);Muscle weakness (generalized) (M62.81)    Time: 8099-8338 PT Time Calculation (min) (ACUTE ONLY): 34 min   Charges:   PT Evaluation $PT Eval Low Complexity: 1 Low PT Treatments $Gait Training: 8-22 mins $Therapeutic Exercise: 8-22 mins        12:14 PM, 02/20/19 Etta Grandchild, PT, DPT Physical Therapist - [facility]  [phone] ([place])    [place] [facility] 02/20/2019, 12:11 PM
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
## 2019-02-20 NOTE — Progress Notes (Signed)
Patient called out noting blood on his gown. RN noted bleeding at surgical site. Dressing reinforced and ace wrap applied. MD paged.

## 2019-02-21 LAB — CBC
HCT: 30.8 % — ABNORMAL LOW (ref 39.0–52.0)
Hemoglobin: 10 g/dL — ABNORMAL LOW (ref 13.0–17.0)
MCH: 31.4 pg (ref 26.0–34.0)
MCHC: 32.5 g/dL (ref 30.0–36.0)
MCV: 96.9 fL (ref 80.0–100.0)
Platelets: 120 10*3/uL — ABNORMAL LOW (ref 150–400)
RBC: 3.18 MIL/uL — ABNORMAL LOW (ref 4.22–5.81)
RDW: 14.7 % (ref 11.5–15.5)
WBC: 8.7 10*3/uL (ref 4.0–10.5)
nRBC: 0 % (ref 0.0–0.2)

## 2019-02-21 LAB — GLUCOSE, CAPILLARY
Glucose-Capillary: 136 mg/dL — ABNORMAL HIGH (ref 70–99)
Glucose-Capillary: 174 mg/dL — ABNORMAL HIGH (ref 70–99)
Glucose-Capillary: 133 mg/dL — ABNORMAL HIGH (ref 70–99)
Glucose-Capillary: 143 mg/dL — ABNORMAL HIGH (ref 70–99)

## 2019-02-21 MED ORDER — OXYCODONE HCL 5 MG PO TABS
10.0000 mg | ORAL_TABLET | ORAL | Status: DC | PRN
  Administered 2019-02-21 – 2019-02-23 (×12): 10 mg via ORAL
  Filled 2019-02-21 (×12): qty 2

## 2019-02-21 NOTE — Progress Notes (Signed)
  Subjective:  Patient reports pain as moderate.    Objective:   VITALS:   Vitals:   02/20/19 2115 02/21/19 0419 02/21/19 0730 02/21/19 1459  BP: (!) 132/91 124/80 (!) 130/92 136/80  Pulse: 93 (!) 110 (!) 108 (!) 103  Resp: 18 18 18 19   Temp:  98.8 F (37.1 C) 98.8 F (37.1 C) 98.9 F (37.2 C)  TempSrc:   Oral Oral  SpO2: 98% 95% 96% 94%  Weight:      Height:        PHYSICAL EXAM:  Sensation intact distally Dorsiflexion/Plantar flexion intact Incision: dressing C/D/I Compartment soft  LABS  Results for orders placed or performed during the hospital encounter of 02/19/19 (from the past 24 hour(s))  Glucose, capillary     Status: Abnormal   Collection Time: 02/20/19  4:35 PM  Result Value Ref Range   Glucose-Capillary 149 (H) 70 - 99 mg/dL  Glucose, capillary     Status: Abnormal   Collection Time: 02/20/19  9:15 PM  Result Value Ref Range   Glucose-Capillary 181 (H) 70 - 99 mg/dL  CBC     Status: Abnormal   Collection Time: 02/21/19  3:27 AM  Result Value Ref Range   WBC 8.7 4.0 - 10.5 K/uL   RBC 3.18 (L) 4.22 - 5.81 MIL/uL   Hemoglobin 10.0 (L) 13.0 - 17.0 g/dL   HCT 30.8 (L) 39.0 - 52.0 %   MCV 96.9 80.0 - 100.0 fL   MCH 31.4 26.0 - 34.0 pg   MCHC 32.5 30.0 - 36.0 g/dL   RDW 14.7 11.5 - 15.5 %   Platelets 120 (L) 150 - 400 K/uL   nRBC 0.0 0.0 - 0.2 %  Glucose, capillary     Status: Abnormal   Collection Time: 02/21/19  7:28 AM  Result Value Ref Range   Glucose-Capillary 136 (H) 70 - 99 mg/dL   Comment 1 Notify RN   Glucose, capillary     Status: Abnormal   Collection Time: 02/21/19 11:55 AM  Result Value Ref Range   Glucose-Capillary 174 (H) 70 - 99 mg/dL   Comment 1 Notify RN     No results found.  Assessment/Plan: 2 Days Post-Op   Active Problems:   Osteonecrosis of left hip ([place])   Up with therapy Plan for discharge tomorrow  Oxycodone 10 mg every 4 hours as needed   Lovell Sheehan , MD 02/21/2019, 4:25 PM

## 2019-02-21 NOTE — Progress Notes (Signed)
PT Cancellation Note  Patient Details Name: Joshua Mays MRN: [id number] DOB: 1966-08-03   Cancelled Treatment:    Reason Eval/Treat Not Completed: Pain limiting ability to participate   Pt refused session this am due to pain.  Stated MD was in and increased pain medication.  Discussed with RN who stated increased dose would be a available at noon.  Will return after lunch.   [name] 02/21/2019, 2:25 PM
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
## 2019-02-21 NOTE — Progress Notes (Signed)
Patients' pain has increased significantly after being told by MD that he would be discharging in the am. This Probation officer encouraged patient to discuss with MD in the am

## 2019-02-22 LAB — GLUCOSE, CAPILLARY
Glucose-Capillary: 138 mg/dL — ABNORMAL HIGH (ref 70–99)
Glucose-Capillary: 126 mg/dL — ABNORMAL HIGH (ref 70–99)
Glucose-Capillary: 114 mg/dL — ABNORMAL HIGH (ref 70–99)

## 2019-02-22 MED ORDER — RIVAROXABAN 10 MG PO TABS: 10.0000 mg | ORAL_TABLET | Freq: Every day | ORAL | 0 refills | Status: DC

## 2019-02-22 MED ORDER — DOCUSATE SODIUM 100 MG PO CAPS: 100.0000 mg | ORAL_CAPSULE | Freq: Two times a day (BID) | ORAL | 0 refills | Status: AC

## 2019-02-22 MED ORDER — OXYCODONE HCL 10 MG PO TABS: 10.0000 mg | ORAL_TABLET | ORAL | 0 refills | Status: DC | PRN

## 2019-02-22 NOTE — Plan of Care (Signed)
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
## 2019-02-22 NOTE — Discharge Instructions (Signed)

## 2019-02-22 NOTE — Progress Notes (Signed)
Physical Therapy Treatment Patient Details Name: Joshua Mays MRN: [id number] DOB: 09/19/1966 Today's Date: 02/22/2019    History of Present Illness Joshua Mays is a 53yo male who comes to [facility] for elective THA after several months of pain and disability related to femoral necrosis.     PT Comments    Pt with good pain control this pm.  In and out of bed with ease.  Stood and was able to ambulate around nursing unit with walker and supervision.  Pt with overall increased confidence and comfort level regarding discharge home tomorrow.    Plan to see pt in am for gait and HEP review.  Pt seen BID today as he declined yesterday an to increase mobility for discharge tomorrow.   Follow Up Recommendations  Home health PT     Equipment Recommendations  Rolling walker with 5" wheels    Recommendations for Other Services       Precautions / Restrictions Precautions Precautions: Fall Restrictions Weight Bearing Restrictions: Yes LLE Weight Bearing: Weight bearing as tolerated    Mobility  Bed Mobility Overal bed mobility: Modified Independent                Transfers Overall transfer level: Needs assistance Equipment used: Rolling walker (2 wheeled) Transfers: Sit to/from Stand Sit to Stand: Supervision            Ambulation/Gait Ambulation/Gait assistance: Supervision;Min guard Gait Distance (Feet): 200 Feet Assistive device: Rolling walker (2 wheeled) Gait Pattern/deviations: Step-to pattern;Step-through pattern Gait velocity: decreased   General Gait Details: irregular pattern with occasional self initated rest breaks   Stairs Stairs: Yes Stairs assistance: Min guard Stair Management: One rail Left;Step to pattern Number of Stairs: 4 General stair comments: navigates well.   Wheelchair Mobility    Modified Rankin (Stroke Patients Only)       Balance Overall balance assessment: Mild deficits observed, not formally tested;Needs  assistance Sitting-balance support: Feet supported;No upper extremity supported Sitting balance-Leahy Scale: Normal     Standing balance support: During functional activity;Bilateral upper extremity supported Standing balance-Leahy Scale: Good                              Cognition Arousal/Alertness: Awake/alert Behavior During Therapy: WFL for tasks assessed/performed Overall Cognitive Status: Within Functional Limits for tasks assessed                                        Exercises      General Comments        Pertinent Vitals/Pain Pain Assessment: Faces Faces Pain Scale: Hurts a little bit Pain Location: Pain under control with current medication schedule Pain Descriptors / Indicators: Sore Pain Intervention(s): Limited activity within patient's tolerance;Monitored during session    Home Living                      Prior Function            PT Goals (current goals can now be found in the care plan section) Progress towards PT goals: Progressing toward goals    Frequency    BID      PT Plan Current plan remains appropriate    Co-evaluation              AM-PAC PT "6 Clicks" Mobility   Outcome Measure  Help needed turning from your back to your side while in a flat bed without using bedrails?: None Help needed moving from lying on your back to sitting on the side of a flat bed without using bedrails?: None Help needed moving to and from a bed to a chair (including a wheelchair)?: None Help needed standing up from a chair using your arms (e.g., wheelchair or bedside chair)?: None Help needed to walk in hospital room?: A Little Help needed climbing 3-5 steps with a railing? : A Little 6 Click Score: 22    End of Session Equipment Utilized During Treatment: Gait belt Activity Tolerance: Patient tolerated treatment well Patient left: in bed;with call bell/phone within reach;with bed alarm set Nurse  Communication: Mobility status       Time: 4356-8616 PT Time Calculation (min) (ACUTE ONLY): 10 min  Charges:  $Gait Training: 8-22 mins                     [name], PTA 02/22/19, 2:02 PM

## 2019-02-22 NOTE — Progress Notes (Signed)
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     PT Comments    Pt out of bed with supervision.  Stand and is able to ambulate 200' with walker and supervision/min guard.  He is able to get up/down stairs with bilateral rails with min guard and general ease.  Pt with no LOB noted.  Returned to bed per his request. Plan is d/c home tomorrow.   Follow Up Recommendations  Home health PT     Equipment Recommendations  Rolling walker with 5" wheels    Recommendations for Other Services       Precautions / Restrictions Precautions Precautions: Fall Restrictions Weight Bearing Restrictions: Yes LLE Weight Bearing: Weight bearing as tolerated    Mobility  Bed Mobility Overal bed mobility: Modified Independent                Transfers Overall transfer level: Needs assistance Equipment used: Rolling walker (2 wheeled) Transfers: Sit to/from Stand Sit to Stand: Supervision            Ambulation/Gait Ambulation/Gait assistance: Supervision;Min guard Gait Distance (Feet): 200 Feet Assistive device: Rolling walker (2 wheeled) Gait Pattern/deviations: Step-to pattern;Step-through pattern Gait velocity: decreased   General Gait Details: irregular pattern with occasional self initated rest breaks   Stairs Stairs: Yes Stairs assistance: Min guard Stair Management: One rail Left;Step to pattern Number of Stairs: 4 General stair comments: navigates well.   Wheelchair Mobility    Modified Rankin (Stroke Patients Only)       Balance Overall balance assessment: Mild deficits observed, not formally tested;Needs assistance Sitting-balance support: Feet supported;No upper extremity supported Sitting balance-Leahy  Scale: Normal     Standing balance support: During functional activity;Bilateral upper extremity supported Standing balance-Leahy Scale: Good                              Cognition Arousal/Alertness: Awake/alert Behavior During Therapy: WFL for tasks assessed/performed Overall Cognitive Status: Within Functional Limits for tasks assessed                                        Exercises      General Comments        Pertinent Vitals/Pain Pain Assessment: Faces Faces Pain Scale: Hurts a little bit Pain Descriptors / Indicators: Operative site guarding Pain Intervention(s): Limited activity within patient's tolerance;Monitored during session;Premedicated before session    Home Living                      Prior Function            PT Goals (current goals can now be found in the care plan section) Progress towards PT goals: Progressing toward goals    Frequency    BID      PT Plan Current plan remains appropriate    Co-evaluation              AM-PAC PT "6 Clicks" Mobility   Outcome Measure  Help needed turning from your back to your side while in a flat bed without using bedrails?: A Little Help needed moving  from lying on your back to sitting on the side of a flat bed without using bedrails?: A Little Help needed moving to and from a bed to a chair (including a wheelchair)?: A Little Help needed standing up from a chair using your arms (e.g., wheelchair or bedside chair)?: A Little Help needed to walk in hospital room?: A Little Help needed climbing 3-5 steps with a railing? : A Little 6 Click Score: 18    End of Session Equipment Utilized During Treatment: Gait belt Activity Tolerance: Patient tolerated treatment well Patient left: in bed;with call bell/phone within reach;with bed alarm set Nurse Communication: Mobility status       Time: 8891-6945 PT Time Calculation (min) (ACUTE ONLY): 23 min  Charges:   $Gait Training: 23-37 mins                     [name], PTA 02/22/19, 12:16 PM

## 2019-02-23 LAB — GLUCOSE, CAPILLARY
Glucose-Capillary: 172 mg/dL — ABNORMAL HIGH (ref 70–99)
Glucose-Capillary: 199 mg/dL — ABNORMAL HIGH (ref 70–99)

## 2019-02-23 LAB — SURGICAL PATHOLOGY

## 2019-02-23 NOTE — Progress Notes (Signed)
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     PT Comments    Pt in bed, ready for session.  Out of bed with ease and was able to ambulate around unit x 1 with walker and min guard/supervision.  Declined further stair training today.  HEP reviewed.  Pt feels comfortable and ready for discharge.  No further PT concerns.   Follow Up Recommendations  Home health PT     Equipment Recommendations  Rolling walker with 5" wheels    Recommendations for Other Services       Precautions / Restrictions Precautions Precautions: Fall Restrictions Weight Bearing Restrictions: Yes LLE Weight Bearing: Weight bearing as tolerated    Mobility  Bed Mobility Overal bed mobility: Modified Independent                Transfers Overall transfer level: Modified independent   Transfers: Sit to/from Stand Sit to Stand: Modified independent (Device/Increase time)            Ambulation/Gait Ambulation/Gait assistance: Supervision Gait Distance (Feet): 200 Feet Assistive device: Rolling walker (2 wheeled) Gait Pattern/deviations: Step-to pattern;Step-through pattern Gait velocity: decreased   General Gait Details: irregular pattern with occasional self initated rest breaks   Stairs         General stair comments: declined stair training today.  feels comfortable and ready for discharge   Wheelchair Mobility    Modified Rankin (Stroke Patients Only)       Balance Overall balance assessment: Mild deficits observed, not formally tested;Needs assistance Sitting-balance support: Feet supported;No upper extremity supported Sitting balance-Leahy Scale: Normal     Standing balance support: During functional  activity;Bilateral upper extremity supported Standing balance-Leahy Scale: Good                              Cognition                                              Exercises Other Exercises Other Exercises: reviewed HEP and pt feels comfortable.    General Comments        Pertinent Vitals/Pain Pain Assessment: 0-10 Pain Score: 2  Pain Location: Pain under control with current medication schedule Pain Descriptors / Indicators: Sore Pain Intervention(s): Limited activity within patient's tolerance;Monitored during session;Premedicated before session    Home Living                      Prior Function            PT Goals (current goals can now be found in the care plan section) Progress towards PT goals: Progressing toward goals    Frequency           PT Plan Current plan remains appropriate    Co-evaluation              AM-PAC PT "6 Clicks" Mobility   Outcome Measure  Help needed turning from your back to your side while in a flat bed without using bedrails?: None Help needed moving from lying on your back to sitting on  the side of a flat bed without using bedrails?: None Help needed moving to and from a bed to a chair (including a wheelchair)?: None Help needed standing up from a chair using your arms (e.g., wheelchair or bedside chair)?: None Help needed to walk in hospital room?: A Little Help needed climbing 3-5 steps with a railing? : A Little 6 Click Score: 22    End of Session Equipment Utilized During Treatment: Gait belt Activity Tolerance: Patient tolerated treatment well Patient left: in chair;with call bell/phone within reach;with chair alarm set Nurse Communication: Mobility status       Time: 8786-7672 PT Time Calculation (min) (ACUTE ONLY): 15 min  Charges:  $Gait Training: 8-22 mins                     [name], PTA 02/23/19, 10:29 AM

## 2019-02-23 NOTE — Progress Notes (Signed)
Discharge instructions reviewed with patient. Verbalized understanding. IV removed. Walker delivered. Vitals stable. He is now waiting for his ride.

## 2019-02-24 ENCOUNTER — Other Ambulatory Visit: Payer: Self-pay | Admitting: Family Medicine

## 2019-02-24 ENCOUNTER — Telehealth: Payer: Self-pay

## 2019-02-24 DIAGNOSIS — R739 Hyperglycemia, unspecified: Secondary | ICD-10-CM

## 2019-02-24 MED ORDER — METFORMIN HCL 500 MG PO TABS: 500.0000 mg | ORAL_TABLET | Freq: Two times a day (BID) | ORAL | 3 refills | Status: AC

## 2019-02-24 NOTE — Telephone Encounter (Signed)
Patient tadvised

## 2019-02-24 NOTE — Telephone Encounter (Signed)
Please review and advise.

## 2019-02-24 NOTE — Telephone Encounter (Signed)
Hgb A1C was 7.3% on 01-30-19 and during hospitalization blood sugar was up over 200. Continue Metformin 500 mg BID and recheck levels of glucometer readings in a month to see if further medication adjustments are needed. Sent refills to [facility].

## 2019-02-24 NOTE — Telephone Encounter (Signed)
Patient called office stating that he was d/c from [facility] ED yesterday due to high blood sugar, patient states that he was given insulin and Metformin in ED and upon discharge he states that he was told to contact his PCP to continue Metformin. Patient is asking if we can send in prescription to [facility] pharmacy? Please advise, patient can be reached at his home number with any further questions. KW

## 2019-05-19 ENCOUNTER — Ambulatory Visit: Payer: Self-pay | Admitting: Orthopedic Surgery

## 2019-05-26 ENCOUNTER — Encounter
Admission: RE | Admit: 2019-05-26 | Discharge: 2019-05-26 | Disposition: A | Discharge status: Home / Self Care | Payer: Medicaid Other | Source: Ambulatory Visit | Attending: Orthopedic Surgery | Admitting: Orthopedic Surgery

## 2019-05-26 ENCOUNTER — Other Ambulatory Visit: Payer: Self-pay

## 2019-05-26 DIAGNOSIS — Z01812 Encounter for preprocedural laboratory examination: Secondary | ICD-10-CM | POA: Insufficient documentation

## 2019-05-26 LAB — URINALYSIS, ROUTINE W REFLEX MICROSCOPIC
Bilirubin Urine: NEGATIVE
Glucose, UA: NEGATIVE mg/dL
Hgb urine dipstick: NEGATIVE
Ketones, ur: NEGATIVE mg/dL
Leukocytes,Ua: NEGATIVE
Nitrite: NEGATIVE
Protein, ur: NEGATIVE mg/dL
Specific Gravity, Urine: 1.028 (ref 1.005–1.030)
pH: 5 (ref 5.0–8.0)

## 2019-05-26 LAB — CBC
HCT: 44.7 % (ref 39.0–52.0)
Hemoglobin: 14 g/dL (ref 13.0–17.0)
MCH: 28 pg (ref 26.0–34.0)
MCHC: 31.3 g/dL (ref 30.0–36.0)
MCV: 89.4 fL (ref 80.0–100.0)
Platelets: 259 10*3/uL (ref 150–400)
RBC: 5 MIL/uL (ref 4.22–5.81)
RDW: 15.5 % (ref 11.5–15.5)
WBC: 9 10*3/uL (ref 4.0–10.5)
nRBC: 0 % (ref 0.0–0.2)

## 2019-05-26 LAB — TYPE AND SCREEN
ABO/RH(D): O POS
Antibody Screen: NEGATIVE

## 2019-05-26 LAB — BASIC METABOLIC PANEL
Anion gap: 9 (ref 5–15)
BUN: 12 mg/dL (ref 6–20)
CO2: 21 mmol/L — ABNORMAL LOW (ref 22–32)
Calcium: 9.6 mg/dL (ref 8.9–10.3)
Chloride: 107 mmol/L (ref 98–111)
Creatinine, Ser: 0.8 mg/dL (ref 0.61–1.24)
GFR calc Af Amer: >60 mL/min (ref >60)
GFR calc non Af Amer: >60 mL/min (ref >60)
Glucose, Bld: 103 mg/dL — ABNORMAL HIGH (ref 70–99)
Potassium: 3.9 mmol/L (ref 3.5–5.1)
Sodium: 137 mmol/L (ref 135–145)

## 2019-05-26 LAB — APTT: aPTT: 31 seconds (ref 24–36)

## 2019-05-26 LAB — SURGICAL PCR SCREEN
MRSA, PCR: NEGATIVE
Staphylococcus aureus: NEGATIVE

## 2019-05-26 LAB — PROTIME-INR
INR: 1 (ref 0.8–1.2)
Prothrombin Time: 12.8 seconds (ref 11.4–15.2)

## 2019-05-26 NOTE — Patient Instructions (Signed)
Your procedure is scheduled on: 06-03-19 [facility] Report to Same Day Surgery 2nd floor medical mall [facility] Entrance-take elevator on left to 2nd floor.  Check in with surgery information desk.) To find out your arrival time please call [phone] between 1PM - 3PM on 06-02-19 TUESDAY  Remember: Instructions that are not followed completely may result in serious medical risk, up to and including death, or upon the discretion of your surgeon and anesthesiologist your surgery may need to be rescheduled.    _x___ 1. Do not eat food after midnight the night before your procedure. NO GUM OR CANDY AFTER MIDNIGHT.  You may drink WATER up to 2 hours before you are scheduled to arrive at the hospital for your procedure.  Do not drink WATER within 2 hours of your scheduled arrival to the hospital.  Type 1 and type 2 diabetics should only drink water.   ____Ensure clear carbohydrate drink on the way to the hospital for bariatric patients  _X___DRINK G2 GATORADE 3 hours before surgery.    __x__ 2. No Alcohol for 24 hours before or after surgery.   __x__3. No Smoking or e-cigarettes for 24 prior to surgery.  Do not use any chewable tobacco products for at least 6 hour prior to surgery   ____  4. Bring all medications with you on the day of surgery if instructed.    __x__ 5. Notify your doctor if there is any change in your medical condition     (cold, fever, infections).    x___6. On the morning of surgery brush your teeth with toothpaste and water.  You may rinse your mouth with mouth wash if you wish.  Do not swallow any toothpaste or mouthwash.   Do not wear jewelry, make-up, hairpins, clips or nail polish.  Do not wear lotions, powders, or perfumes.  Do not shave 48 hours prior to surgery. Men may shave face and neck.  Do not bring valuables to the hospital.    [facility] is not responsible for any belongings or valuables.               Contacts, dentures or bridgework may not be  worn into surgery.  Leave your suitcase in the car. After surgery it may be brought to your room.  For patients admitted to the hospital, discharge time is determined by your treatment team.  _  Patients discharged the day of surgery will not be allowed to drive home.  You will need someone to drive you home and stay with you the night of your procedure.    Please read over the following fact sheets that you were given:   [facility] Preparing for Surgery and or MRSA Information   _x___ TAKE THE FOLLOWING MEDICATION THE MORNING OF SURGERY WITH A SMALL SIP OF WATER. These include:  1. GABAPENTIN (NEURONTIN)  2. YOU MAY TAKE PERCOCET THE MORNING OF SURGERY IF NEEDED  3.  4.  5.  6.  ____Fleets enema or Magnesium Citrate as directed.   _x___ Use CHG Soap or sage wipes as directed on instruction sheet   ____ Use inhalers on the day of surgery and bring to hospital day of surgery  _X___ Stop Metformin 2 days prior to surgery-LAST DOSE ON Sunday, September 6TH   ____ Take 1/2 of usual insulin dose the night before surgery and none on the morning surgery.   ____ Follow recommendations from Cardiologist, Pulmonologist or PCP regarding  stopping Aspirin, Coumadin, Plavix ,Eliquis, Effient, or  Pradaxa, and Pletal.  X____Stop Anti-inflammatories such as Advil, Aleve, Ibuprofen, Motrin, Naproxen, Naprosyn, Goodies powders or aspirin products NOW- OK to take Tylenol OR PERCOCET IF NEEDED   ____ Stop supplements until after surgery.     ____ Bring C-Pap to the hospital.

## 2019-05-29 ENCOUNTER — Other Ambulatory Visit
Admission: RE | Admit: 2019-05-29 | Discharge: 2019-05-29 | Disposition: A | Discharge status: Home / Self Care | Payer: Medicaid Other | Source: Ambulatory Visit | Attending: Orthopedic Surgery | Admitting: Orthopedic Surgery

## 2019-05-29 ENCOUNTER — Other Ambulatory Visit: Payer: Self-pay

## 2019-05-29 DIAGNOSIS — Z01812 Encounter for preprocedural laboratory examination: Secondary | ICD-10-CM | POA: Insufficient documentation

## 2019-05-29 DIAGNOSIS — Z20828 Contact with and (suspected) exposure to other viral communicable diseases: Secondary | ICD-10-CM | POA: Diagnosis not present

## 2019-05-29 LAB — SARS CORONAVIRUS 2 (TAT 6-24 HRS): SARS Coronavirus 2: NEGATIVE

## 2019-05-29 NOTE — Pre-Procedure Instructions (Signed)
MEDICAL CLEARANCE ON CHART LOW RISK 

## 2019-06-02 MED ORDER — TRANEXAMIC ACID-NACL 1000-0.7 MG/100ML-% IV SOLN
1000.0000 mg | INTRAVENOUS | Status: AC
  Administered 2019-06-03: 14:00:00 1000 mg via INTRAVENOUS
  Filled 2019-06-02: qty 100

## 2019-06-02 MED ORDER — CLINDAMYCIN PHOSPHATE 900 MG/50ML IV SOLN
900.0000 mg | INTRAVENOUS | Status: AC
  Administered 2019-06-03: 900 mg via INTRAVENOUS

## 2019-06-03 ENCOUNTER — Inpatient Hospital Stay: Payer: Medicaid Other | Admitting: Anesthesiology

## 2019-06-03 ENCOUNTER — Inpatient Hospital Stay: Payer: Medicaid Other

## 2019-06-03 ENCOUNTER — Encounter
Admission: RE | Disposition: A | Discharge status: Home / Self Care | Payer: Self-pay | Source: Home / Self Care | Attending: Orthopedic Surgery

## 2019-06-03 ENCOUNTER — Encounter: Payer: Self-pay | Admitting: *Deleted

## 2019-06-03 ENCOUNTER — Other Ambulatory Visit: Payer: Self-pay

## 2019-06-03 ENCOUNTER — Inpatient Hospital Stay
Admission: RE | Admit: 2019-06-03 | Discharge: 2019-06-06 | DRG: 470 | Disposition: A | Discharge status: Home / Self Care | Payer: Medicaid Other | Attending: Orthopedic Surgery | Admitting: Orthopedic Surgery

## 2019-06-03 DIAGNOSIS — Z79891 Long term (current) use of opiate analgesic: Secondary | ICD-10-CM

## 2019-06-03 DIAGNOSIS — I10 Essential (primary) hypertension: Secondary | ICD-10-CM | POA: Diagnosis present

## 2019-06-03 DIAGNOSIS — Z888 Allergy status to other drugs, medicaments and biological substances status: Secondary | ICD-10-CM | POA: Diagnosis not present

## 2019-06-03 DIAGNOSIS — Z419 Encounter for procedure for purposes other than remedying health state, unspecified: Secondary | ICD-10-CM

## 2019-06-03 DIAGNOSIS — M879 Osteonecrosis, unspecified: Principal | ICD-10-CM | POA: Diagnosis present

## 2019-06-03 DIAGNOSIS — Z87891 Personal history of nicotine dependence: Secondary | ICD-10-CM

## 2019-06-03 DIAGNOSIS — E119 Type 2 diabetes mellitus without complications: Secondary | ICD-10-CM | POA: Diagnosis present

## 2019-06-03 DIAGNOSIS — Z96642 Presence of left artificial hip joint: Secondary | ICD-10-CM | POA: Diagnosis present

## 2019-06-03 DIAGNOSIS — M1611 Unilateral primary osteoarthritis, right hip: Secondary | ICD-10-CM | POA: Diagnosis present

## 2019-06-03 DIAGNOSIS — M25751 Osteophyte, right hip: Secondary | ICD-10-CM | POA: Diagnosis present

## 2019-06-03 DIAGNOSIS — Z79899 Other long term (current) drug therapy: Secondary | ICD-10-CM | POA: Diagnosis not present

## 2019-06-03 DIAGNOSIS — Z09 Encounter for follow-up examination after completed treatment for conditions other than malignant neoplasm: Secondary | ICD-10-CM

## 2019-06-03 DIAGNOSIS — M25551 Pain in right hip: Secondary | ICD-10-CM | POA: Diagnosis present

## 2019-06-03 DIAGNOSIS — Z23 Encounter for immunization: Secondary | ICD-10-CM | POA: Diagnosis not present

## 2019-06-03 DIAGNOSIS — Z96641 Presence of right artificial hip joint: Secondary | ICD-10-CM

## 2019-06-03 HISTORY — PX: TOTAL HIP ARTHROPLASTY: SHX124

## 2019-06-03 LAB — GLUCOSE, CAPILLARY
Glucose-Capillary: 129 mg/dL — ABNORMAL HIGH (ref 70–99)
Glucose-Capillary: 157 mg/dL — ABNORMAL HIGH (ref 70–99)

## 2019-06-03 SURGERY — ARTHROPLASTY, HIP, TOTAL, ANTERIOR APPROACH
Anesthesia: General | Site: Hip | Laterality: Right

## 2019-06-03 MED ORDER — BISACODYL 10 MG RE SUPP: 10.0000 mg | Freq: Every day | RECTAL | Status: DC | PRN

## 2019-06-03 MED ORDER — ROCURONIUM BROMIDE 50 MG/5ML IV SOLN
INTRAVENOUS | Status: AC
  Filled 2019-06-03: qty 1

## 2019-06-03 MED ORDER — RIVAROXABAN 10 MG PO TABS
10.0000 mg | ORAL_TABLET | Freq: Every day | ORAL | Status: DC
  Administered 2019-06-04 – 2019-06-06 (×3): 10 mg via ORAL
  Filled 2019-06-03 (×3): qty 1

## 2019-06-03 MED ORDER — LABETALOL HCL 5 MG/ML IV SOLN
INTRAVENOUS | Status: AC
  Administered 2019-06-03: 5 mg via INTRAVENOUS
  Filled 2019-06-03: qty 4

## 2019-06-03 MED ORDER — ONDANSETRON HCL 4 MG/2ML IJ SOLN: 4.0000 mg | Freq: Four times a day (QID) | INTRAMUSCULAR | Status: DC | PRN

## 2019-06-03 MED ORDER — CHLORHEXIDINE GLUCONATE 4 % EX LIQD
60.0000 mL | Freq: Once | CUTANEOUS | Status: AC
  Administered 2019-06-03: 4 via TOPICAL

## 2019-06-03 MED ORDER — SODIUM CHLORIDE 0.9 % IR SOLN
Status: DC | PRN
  Administered 2019-06-03: 2000 mL

## 2019-06-03 MED ORDER — TRAMADOL HCL 50 MG PO TABS
50.0000 mg | ORAL_TABLET | Freq: Four times a day (QID) | ORAL | Status: DC
  Administered 2019-06-03 – 2019-06-06 (×9): 50 mg via ORAL
  Filled 2019-06-03 (×9): qty 1

## 2019-06-03 MED ORDER — OXYCODONE HCL 5 MG PO TABS
10.0000 mg | ORAL_TABLET | ORAL | Status: DC
  Administered 2019-06-03 – 2019-06-06 (×16): 10 mg via ORAL
  Filled 2019-06-03 (×17): qty 2

## 2019-06-03 MED ORDER — HYDROMORPHONE HCL 1 MG/ML IJ SOLN
INTRAMUSCULAR | Status: AC
  Administered 2019-06-03: 0.5 mg via INTRAVENOUS
  Filled 2019-06-03: qty 1

## 2019-06-03 MED ORDER — ONDANSETRON HCL 4 MG PO TABS: 4.0000 mg | ORAL_TABLET | Freq: Four times a day (QID) | ORAL | Status: DC | PRN

## 2019-06-03 MED ORDER — SODIUM CHLORIDE 0.9% FLUSH: 9.0000 mL | INTRAVENOUS | Status: DC | PRN

## 2019-06-03 MED ORDER — METOCLOPRAMIDE HCL 10 MG PO TABS: 5.0000 mg | ORAL_TABLET | Freq: Three times a day (TID) | ORAL | Status: DC | PRN

## 2019-06-03 MED ORDER — DOCUSATE SODIUM 100 MG PO CAPS
100.0000 mg | ORAL_CAPSULE | Freq: Two times a day (BID) | ORAL | Status: DC
  Administered 2019-06-04 – 2019-06-05 (×2): 100 mg via ORAL
  Filled 2019-06-03 (×4): qty 1

## 2019-06-03 MED ORDER — HYDROMORPHONE HCL 1 MG/ML IJ SOLN: 0.5000 mg | INTRAMUSCULAR | Status: DC | PRN

## 2019-06-03 MED ORDER — KETAMINE HCL 50 MG/ML IJ SOLN
INTRAMUSCULAR | Status: DC | PRN
  Administered 2019-06-03: 50 mg via INTRAVENOUS

## 2019-06-03 MED ORDER — OXYCODONE HCL 5 MG PO TABS: 5.0000 mg | ORAL_TABLET | Freq: Once | ORAL | Status: DC | PRN

## 2019-06-03 MED ORDER — SODIUM CHLORIDE 0.9 % IV SOLN
INTRAVENOUS | Status: DC
  Administered 2019-06-03: 12:00:00 via INTRAVENOUS

## 2019-06-03 MED ORDER — QUETIAPINE FUMARATE 200 MG PO TABS
400.0000 mg | ORAL_TABLET | Freq: Every day | ORAL | Status: DC
  Administered 2019-06-03 – 2019-06-05 (×3): 400 mg via ORAL
  Filled 2019-06-03 (×4): qty 2

## 2019-06-03 MED ORDER — FENTANYL CITRATE (PF) 100 MCG/2ML IJ SOLN
INTRAMUSCULAR | Status: DC | PRN
  Administered 2019-06-03 (×2): 50 ug via INTRAVENOUS
  Administered 2019-06-03: 100 ug via INTRAVENOUS
  Administered 2019-06-03: 50 ug via INTRAVENOUS

## 2019-06-03 MED ORDER — ACETAMINOPHEN 500 MG PO TABS
1000.0000 mg | ORAL_TABLET | Freq: Four times a day (QID) | ORAL | Status: AC
  Administered 2019-06-03 – 2019-06-04 (×3): 1000 mg via ORAL
  Filled 2019-06-03 (×3): qty 2

## 2019-06-03 MED ORDER — BUPIVACAINE-EPINEPHRINE (PF) 0.25% -1:200000 IJ SOLN
INTRAMUSCULAR | Status: DC | PRN
  Administered 2019-06-03: 20 mL

## 2019-06-03 MED ORDER — ONDANSETRON HCL 4 MG/2ML IJ SOLN
INTRAMUSCULAR | Status: DC | PRN
  Administered 2019-06-03: 4 mg via INTRAVENOUS

## 2019-06-03 MED ORDER — BACITRACIN 50000 UNITS IM SOLR
INTRAMUSCULAR | Status: AC
  Filled 2019-06-03: qty 2

## 2019-06-03 MED ORDER — FENTANYL CITRATE (PF) 100 MCG/2ML IJ SOLN
INTRAMUSCULAR | Status: AC
  Administered 2019-06-03: 50 ug via INTRAVENOUS
  Filled 2019-06-03: qty 2

## 2019-06-03 MED ORDER — MORPHINE SULFATE 2 MG/ML IV SOLN
INTRAVENOUS | Status: DC
  Administered 2019-06-03: 20:00:00 via INTRAVENOUS
  Administered 2019-06-03: 11 mg via INTRAVENOUS
  Administered 2019-06-04: 03:00:00 via INTRAVENOUS
  Filled 2019-06-03 (×2): qty 30

## 2019-06-03 MED ORDER — LORAZEPAM 2 MG/ML IJ SOLN
1.0000 mg | Freq: Once | INTRAMUSCULAR | Status: AC
  Administered 2019-06-03: 16:00:00 1 mg via INTRAVENOUS

## 2019-06-03 MED ORDER — FAMOTIDINE 20 MG PO TABS
ORAL_TABLET | ORAL | Status: AC
  Administered 2019-06-03: 20 mg via ORAL
  Filled 2019-06-03: qty 1

## 2019-06-03 MED ORDER — DIPHENHYDRAMINE HCL 50 MG/ML IJ SOLN: 12.5000 mg | Freq: Four times a day (QID) | INTRAMUSCULAR | Status: DC | PRN

## 2019-06-03 MED ORDER — MAGNESIUM HYDROXIDE 400 MG/5ML PO SUSP: 30.0000 mL | Freq: Every day | ORAL | Status: DC | PRN

## 2019-06-03 MED ORDER — MENTHOL 3 MG MT LOZG
1.0000 | LOZENGE | OROMUCOSAL | Status: DC | PRN
  Filled 2019-06-03: qty 9

## 2019-06-03 MED ORDER — ACETAMINOPHEN 500 MG PO TABS
ORAL_TABLET | ORAL | Status: AC
  Administered 2019-06-03: 19:00:00
  Filled 2019-06-03: qty 1

## 2019-06-03 MED ORDER — FAMOTIDINE 20 MG PO TABS
20.0000 mg | ORAL_TABLET | Freq: Once | ORAL | Status: AC
  Administered 2019-06-03: 12:00:00 20 mg via ORAL

## 2019-06-03 MED ORDER — BUPIVACAINE HCL (PF) 0.5 % IJ SOLN
INTRAMUSCULAR | Status: AC
  Filled 2019-06-03: qty 10

## 2019-06-03 MED ORDER — METHOCARBAMOL 500 MG PO TABS
500.0000 mg | ORAL_TABLET | Freq: Four times a day (QID) | ORAL | Status: DC | PRN
  Administered 2019-06-03 – 2019-06-05 (×4): 500 mg via ORAL
  Filled 2019-06-03 (×5): qty 1

## 2019-06-03 MED ORDER — ACETAMINOPHEN 500 MG PO TABS
1000.0000 mg | ORAL_TABLET | Freq: Once | ORAL | Status: AC
  Administered 2019-06-03: 12:00:00 1000 mg via ORAL

## 2019-06-03 MED ORDER — ACETAMINOPHEN 325 MG PO TABS: 325.0000 mg | ORAL_TABLET | Freq: Four times a day (QID) | ORAL | Status: DC | PRN

## 2019-06-03 MED ORDER — LACTATED RINGERS IV SOLN: INTRAVENOUS | Status: DC

## 2019-06-03 MED ORDER — PROPOFOL 10 MG/ML IV BOLUS
INTRAVENOUS | Status: DC | PRN
  Administered 2019-06-03: 200 mg via INTRAVENOUS

## 2019-06-03 MED ORDER — INFLUENZA VAC SPLIT QUAD 0.5 ML IM SUSY
0.5000 mL | PREFILLED_SYRINGE | INTRAMUSCULAR | Status: AC
  Administered 2019-06-04: 0.5 mL via INTRAMUSCULAR
  Filled 2019-06-03: qty 0.5

## 2019-06-03 MED ORDER — LIDOCAINE HCL (CARDIAC) PF 100 MG/5ML IV SOSY
PREFILLED_SYRINGE | INTRAVENOUS | Status: DC | PRN
  Administered 2019-06-03: 100 mg via INTRAVENOUS

## 2019-06-03 MED ORDER — CLINDAMYCIN PHOSPHATE 900 MG/50ML IV SOLN
INTRAVENOUS | Status: AC
  Filled 2019-06-03: qty 50

## 2019-06-03 MED ORDER — LACTATED RINGERS IV SOLN
INTRAVENOUS | Status: DC
  Administered 2019-06-03: 19:00:00 via INTRAVENOUS

## 2019-06-03 MED ORDER — METFORMIN HCL 500 MG PO TABS
500.0000 mg | ORAL_TABLET | Freq: Two times a day (BID) | ORAL | Status: DC
  Administered 2019-06-04 – 2019-06-06 (×5): 500 mg via ORAL
  Filled 2019-06-03 (×5): qty 1

## 2019-06-03 MED ORDER — GABAPENTIN 600 MG PO TABS
600.0000 mg | ORAL_TABLET | Freq: Three times a day (TID) | ORAL | Status: DC
  Administered 2019-06-03 – 2019-06-06 (×9): 600 mg via ORAL
  Filled 2019-06-03 (×11): qty 1

## 2019-06-03 MED ORDER — MIDAZOLAM HCL 2 MG/2ML IJ SOLN
INTRAMUSCULAR | Status: DC | PRN
  Administered 2019-06-03: 2 mg via INTRAVENOUS

## 2019-06-03 MED ORDER — OXYCODONE HCL 5 MG/5ML PO SOLN: 5.0000 mg | Freq: Once | ORAL | Status: DC | PRN

## 2019-06-03 MED ORDER — FENTANYL CITRATE (PF) 100 MCG/2ML IJ SOLN
25.0000 ug | INTRAMUSCULAR | Status: DC | PRN
  Administered 2019-06-03 (×4): 50 ug via INTRAVENOUS

## 2019-06-03 MED ORDER — ROCURONIUM BROMIDE 100 MG/10ML IV SOLN
INTRAVENOUS | Status: DC | PRN
  Administered 2019-06-03: 20 mg via INTRAVENOUS
  Administered 2019-06-03: 50 mg via INTRAVENOUS

## 2019-06-03 MED ORDER — FENTANYL CITRATE (PF) 250 MCG/5ML IJ SOLN
INTRAMUSCULAR | Status: AC
  Filled 2019-06-03: qty 5

## 2019-06-03 MED ORDER — SODIUM CHLORIDE FLUSH 0.9 % IV SOLN
INTRAVENOUS | Status: AC
  Filled 2019-06-03: qty 20

## 2019-06-03 MED ORDER — HYDROMORPHONE HCL 1 MG/ML IJ SOLN
0.5000 mg | INTRAMUSCULAR | Status: AC | PRN
  Administered 2019-06-03 (×4): 0.5 mg via INTRAVENOUS

## 2019-06-03 MED ORDER — DEXAMETHASONE SODIUM PHOSPHATE 10 MG/ML IJ SOLN
INTRAMUSCULAR | Status: DC | PRN
  Administered 2019-06-03: 5 mg via INTRAVENOUS

## 2019-06-03 MED ORDER — NALOXONE HCL 0.4 MG/ML IJ SOLN: 0.4000 mg | INTRAMUSCULAR | Status: DC | PRN

## 2019-06-03 MED ORDER — MIDAZOLAM HCL 2 MG/2ML IJ SOLN
INTRAMUSCULAR | Status: AC
  Filled 2019-06-03: qty 2

## 2019-06-03 MED ORDER — KETAMINE HCL 50 MG/ML IJ SOLN
INTRAMUSCULAR | Status: AC
  Filled 2019-06-03: qty 10

## 2019-06-03 MED ORDER — LORAZEPAM 2 MG/ML IJ SOLN
INTRAMUSCULAR | Status: AC
  Administered 2019-06-03: 1 mg via INTRAVENOUS
  Filled 2019-06-03: qty 1

## 2019-06-03 MED ORDER — ACETAMINOPHEN 500 MG PO TABS
ORAL_TABLET | ORAL | Status: AC
  Administered 2019-06-03: 1000 mg via ORAL
  Filled 2019-06-03: qty 2

## 2019-06-03 MED ORDER — OXYCODONE HCL 5 MG PO TABS
ORAL_TABLET | ORAL | Status: AC
  Administered 2019-06-03: 10 mg via ORAL
  Filled 2019-06-03: qty 2

## 2019-06-03 MED ORDER — LABETALOL HCL 5 MG/ML IV SOLN
5.0000 mg | INTRAVENOUS | Status: DC | PRN
  Administered 2019-06-03 (×3): 5 mg via INTRAVENOUS

## 2019-06-03 MED ORDER — PHENOL 1.4 % MT LIQD
1.0000 | OROMUCOSAL | Status: DC | PRN
  Filled 2019-06-03: qty 177

## 2019-06-03 MED ORDER — METHOCARBAMOL 1000 MG/10ML IJ SOLN
500.0000 mg | Freq: Four times a day (QID) | INTRAVENOUS | Status: DC | PRN
  Filled 2019-06-03 (×2): qty 5

## 2019-06-03 MED ORDER — BUPIVACAINE-EPINEPHRINE (PF) 0.25% -1:200000 IJ SOLN
INTRAMUSCULAR | Status: AC
  Filled 2019-06-03: qty 30

## 2019-06-03 MED ORDER — MAGNESIUM CITRATE PO SOLN
1.0000 | Freq: Once | ORAL | Status: DC | PRN
  Filled 2019-06-03: qty 296

## 2019-06-03 MED ORDER — METOCLOPRAMIDE HCL 5 MG/ML IJ SOLN: 5.0000 mg | Freq: Three times a day (TID) | INTRAMUSCULAR | Status: DC | PRN

## 2019-06-03 MED ORDER — PROPOFOL 500 MG/50ML IV EMUL
INTRAVENOUS | Status: AC
  Filled 2019-06-03: qty 50

## 2019-06-03 MED ORDER — METHOCARBAMOL 500 MG PO TABS
ORAL_TABLET | ORAL | Status: AC
  Administered 2019-06-03: 500 mg via ORAL
  Filled 2019-06-03: qty 1

## 2019-06-03 MED ORDER — GABAPENTIN 300 MG PO CAPS
ORAL_CAPSULE | ORAL | Status: AC
  Administered 2019-06-03: 600 mg
  Filled 2019-06-03: qty 2

## 2019-06-03 MED ORDER — LIDOCAINE HCL (PF) 2 % IJ SOLN
INTRAMUSCULAR | Status: AC
  Filled 2019-06-03: qty 10

## 2019-06-03 MED ORDER — SUGAMMADEX SODIUM 200 MG/2ML IV SOLN
INTRAVENOUS | Status: DC | PRN
  Administered 2019-06-03: 200 mg via INTRAVENOUS

## 2019-06-03 MED ORDER — ONDANSETRON HCL 4 MG/2ML IJ SOLN
INTRAMUSCULAR | Status: AC
  Filled 2019-06-03: qty 2

## 2019-06-03 MED ORDER — POVIDONE-IODINE 10 % EX SWAB
2.0000 "application " | Freq: Once | CUTANEOUS | Status: AC
  Administered 2019-06-03: 2 via TOPICAL

## 2019-06-03 MED ORDER — DIPHENHYDRAMINE HCL 12.5 MG/5ML PO ELIX
12.5000 mg | ORAL_SOLUTION | Freq: Four times a day (QID) | ORAL | Status: DC | PRN
  Filled 2019-06-03: qty 5

## 2019-06-03 SURGICAL SUPPLY — 47 items
BLADE SAGITTAL WIDE XTHICK NO (BLADE) ×3 IMPLANT
BRUSH SCRUB EZ  4% CHG (MISCELLANEOUS) ×4
BRUSH SCRUB EZ 4% CHG (MISCELLANEOUS) ×2 IMPLANT
CHLORAPREP W/TINT 26 (MISCELLANEOUS) ×3 IMPLANT
COVER HOLE (Hips) ×3 IMPLANT
COVER WAND RF STERILE (DRAPES) ×3 IMPLANT
CUP R3 52MM (Hips) ×3 IMPLANT
DRAPE 3/4 80X56 (DRAPES) ×3 IMPLANT
DRAPE C-ARM 42X72 X-RAY (DRAPES) ×3 IMPLANT
DRAPE STERI IOBAN 125X83 (DRAPES) IMPLANT
DRSG AQUACEL AG ADV 3.5X10 (GAUZE/BANDAGES/DRESSINGS) ×3 IMPLANT
DRSG AQUACEL AG ADV 3.5X14 (GAUZE/BANDAGES/DRESSINGS) IMPLANT
ELECT BLADE 6.5 EXT (BLADE) ×3 IMPLANT
ELECT REM PT RETURN 9FT ADLT (ELECTROSURGICAL) ×3
ELECTRODE REM PT RTRN 9FT ADLT (ELECTROSURGICAL) ×1 IMPLANT
GAUZE XEROFORM 1X8 LF (GAUZE/BANDAGES/DRESSINGS) IMPLANT
GLOVE INDICATOR 8.0 STRL GRN (GLOVE) ×3 IMPLANT
GLOVE SURG ORTHO 8.0 STRL STRW (GLOVE) ×6 IMPLANT
GOWN STRL REUS W/ TWL LRG LVL3 (GOWN DISPOSABLE) ×1 IMPLANT
GOWN STRL REUS W/ TWL XL LVL3 (GOWN DISPOSABLE) ×1 IMPLANT
GOWN STRL REUS W/TWL LRG LVL3 (GOWN DISPOSABLE) ×2
GOWN STRL REUS W/TWL XL LVL3 (GOWN DISPOSABLE) ×2
HEAD FEM KNEE TAPER 36MM XS-3 (Head) ×3 IMPLANT
HOOD PEEL AWAY FLYTE STAYCOOL (MISCELLANEOUS) ×9 IMPLANT
IV NS 1000ML (IV SOLUTION) ×2
IV NS 1000ML BAXH (IV SOLUTION) ×1 IMPLANT
KIT PATIENT CARE HANA TABLE (KITS) ×3 IMPLANT
KIT TURNOVER CYSTO (KITS) ×3 IMPLANT
LINER ACETAB 0 DEG (Liner) ×3 IMPLANT
MAT ABSORB  FLUID 56X50 GRAY (MISCELLANEOUS) ×2
MAT ABSORB FLUID 56X50 GRAY (MISCELLANEOUS) ×1 IMPLANT
NDL SAFETY ECLIPSE 18X1.5 (NEEDLE) ×2 IMPLANT
NEEDLE HYPO 18GX1.5 SHARP (NEEDLE) ×4
NEEDLE HYPO 22GX1.5 SAFETY (NEEDLE) ×3 IMPLANT
NEEDLE SPNL 20GX3.5 QUINCKE YW (NEEDLE) ×3 IMPLANT
PACK HIP PROSTHESIS (MISCELLANEOUS) ×3 IMPLANT
PADDING CAST BLEND 4X4 NS (MISCELLANEOUS) ×6 IMPLANT
PILLOW ABDUCTION MEDIUM (MISCELLANEOUS) ×3 IMPLANT
PULSAVAC PLUS IRRIG FAN TIP (DISPOSABLE) ×3
STAPLER SKIN PROX 35W (STAPLE) ×3 IMPLANT
STEM STD COLLAR SZ7 POLARSTEM (Stem) ×3 IMPLANT
SUT BONE WAX W31G (SUTURE) ×3 IMPLANT
SUT DVC 2 QUILL PDO  T11 36X36 (SUTURE) ×2
SUT DVC 2 QUILL PDO T11 36X36 (SUTURE) ×1 IMPLANT
SUT VIC AB 2-0 CT1 18 (SUTURE) ×3 IMPLANT
SYR 20ML LL LF (SYRINGE) ×3 IMPLANT
TIP FAN IRRIG PULSAVAC PLUS (DISPOSABLE) ×1 IMPLANT

## 2019-06-03 NOTE — Anesthesia Procedure Notes (Signed)
Procedure Name: Intubation Date/Time: 06/03/2019 1:32 PM Performed by: Eben Burow, CRNA Pre-anesthesia Checklist: Patient identified, Emergency Drugs available, Suction available and Patient being monitored Patient Re-evaluated:Patient Re-evaluated prior to induction Oxygen Delivery Method: Circle system utilized Preoxygenation: Pre-oxygenation with 100% oxygen Induction Type: IV induction Ventilation: Mask ventilation without difficulty Laryngoscope Size: McGraph and 4 Grade View: Grade I Tube type: Oral Tube size: 8.0 mm Number of attempts: 1 Airway Equipment and Method: Stylet,  Oral airway,  Video-laryngoscopy and LTA kit utilized Placement Confirmation: positive ETCO2 and breath sounds checked- equal and bilateral Secured at: 23 cm Tube secured with: Tape Dental Injury: Teeth and Oropharynx as per pre-operative assessment

## 2019-06-03 NOTE — Progress Notes (Signed)
Pt having uncontrolled pain. Pt given 224mcg Fentanyl with no relief. Pt crying. Dr. Rosey Bath notified. Acknowledged. Orders received.

## 2019-06-03 NOTE — Anesthesia Post-op Follow-up Note (Signed)
Anesthesia QCDR form completed.        

## 2019-06-03 NOTE — Progress Notes (Signed)
Pt having elevated BP. Dr. Rosey Bath notifed. Acknowledged. Orders received.

## 2019-06-03 NOTE — H&P (Signed)
The patient has been re-examined, and the chart reviewed, and there have been no interval changes to the documented history and physical.  Plan a right total hip replacement today.  Anesthesia is not consulted regarding a peripheral nerve block for post-operative pain.  The risks, benefits, and alternatives have been discussed at length, and the patient is willing to proceed.     

## 2019-06-03 NOTE — Progress Notes (Signed)
Dr. Harlow Mares notified that pt is in continued pain and requested Morphine PCA. Acknowledged. Orders received.

## 2019-06-03 NOTE — Op Note (Signed)
06/03/2019  3:03 PM  PATIENT:  Joshua Mays   MRN: [id number]  PRE-OPERATIVE DIAGNOSIS:  Osteoarthritis right hip   POST-OPERATIVE DIAGNOSIS: Same  Procedure: Right Total Hip Replacement  Surgeon: Elyn Aquas. Harlow Mares, MD   Assist: Carlynn Spry, PA-C  Anesthesia: Spinal   EBL: 100 mL   Specimens: None   Drains: None   Components used: A size 7 Polarstem [employer]hew, R3 size 52 mm shell, and a 36 mm by -3 mm head    Description of the procedure in detail: After informed consent was obtained and the appropriate extremity marked in the pre-operative holding area, the patient was taken to the operating room and placed in the supine position on the fracture table. All pressure points were well padded and bilateral lower extremities were place in traction spars. The hip was prepped and draped in standard sterile fashion. A spinal anesthetic had been delivered by the anesthesia team. The skin and subcutaneous tissues were injected with a mixture of Marcaine with epinephrine for post-operative pain. A longitudinal incision approximately 10 cm in length was carried out from the anterior superior iliac spine to the greater trochanter. The tensor fascia was divided and blunt dissection was taken down to the level of the joint capsule. The lateral circumflex vessels were cauterized. Deep retractors were placed and a portion of the anterior capsule was excised. Using fluoroscopy the neck cut was planned and carried out with a sagittal saw. The head was passed from the field with use of a corkscrew and hip skid. Deep retractors were placed along the acetabulum and the degenerative labrum and large osteophytes were removed with a Rongeur. The cup was sequentially reamed to a size 52 mm. The wound was irrigated and using fluoroscopy the size 52 mm cup was impacted in to anatomic position. A single screw was placed followed by a threaded hole cover. The final liner was impacted in to position.  Attention was then turned to the proximal femur. The leg was placed in extension and external rotation. The canal was opened and sequentially broached to a size 7. The trial components were placed and the hip relocated. The components were found to be in good position using fluoroscopy. The hip was dislocated and the trial components removed. The final components were impacted in to position and the hip relocated. The final components were again check with fluoroscopy and found to be in good position. Hemostasis was achieved with electrocautery. The deep capsule was injected with Marcaine and epinephrine. The wound was irrigated with bacitracin laced normal saline and the tensor fascia closed with #2 Quill suture. The subcutaneous tissues were closed with 2-0 vicryl and staples for the skin. A sterile dressing was applied and an abduction pillow. Patient tolerated the procedure well and there were no apparent complication. Patient was taken to the recovery room in good condition.   Kurtis Bushman, MD

## 2019-06-03 NOTE — Transfer of Care (Signed)
Immediate Anesthesia Transfer of Care Note  Patient: [name]  Procedure(s) Performed: TOTAL HIP ARTHROPLASTY ANTERIOR APPROACH (Right Hip)  Patient Location: PACU  Anesthesia Type:General  Level of Consciousness: awake, alert , oriented and patient cooperative  Airway & Oxygen Therapy: Patient Spontanous Breathing  Post-op Assessment: Report given to RN and Post -op Vital signs reviewed and stable  Post vital signs: Reviewed and stable  Last Vitals:  Vitals Value Taken Time  BP 147/102 06/03/19 1520  Temp 36.3 C 06/03/19 1520  Pulse 93 06/03/19 1525  Resp 16 06/03/19 1525  SpO2 97 % 06/03/19 1525  Vitals shown include unvalidated device data.  Last Pain:  Vitals:   06/03/19 1142  PainSc: 7          Complications: No apparent anesthesia complications
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
## 2019-06-03 NOTE — Anesthesia Postprocedure Evaluation (Signed)
Anesthesia Post Note  Patient: Joshua Mays  Procedure(s) Performed: TOTAL HIP ARTHROPLASTY ANTERIOR APPROACH (Right Hip)  Patient location during evaluation: PACU Anesthesia Type: General Level of consciousness: awake and alert Pain management: pain level controlled Vital Signs Assessment: post-procedure vital signs reviewed and stable Respiratory status: spontaneous breathing, nonlabored ventilation, respiratory function stable and patient connected to nasal cannula oxygen Cardiovascular status: blood pressure returned to baseline and stable Postop Assessment: no apparent nausea or vomiting Anesthetic complications: no     Last Vitals:  Vitals:   06/03/19 2038 06/03/19 2148  BP: (!) 138/97 (!) 131/91  Pulse: 98 97  Resp: 16 16  Temp: 36.9 C 36.8 C  SpO2: 95% 97%    Last Pain:  Vitals:   06/03/19 2148  TempSrc: Oral  PainSc:                  Martha Clan

## 2019-06-04 LAB — CBC
HCT: 35.8 % — ABNORMAL LOW (ref 39.0–52.0)
Hemoglobin: 11.3 g/dL — ABNORMAL LOW (ref 13.0–17.0)
MCH: 28.2 pg (ref 26.0–34.0)
MCHC: 31.6 g/dL (ref 30.0–36.0)
MCV: 89.3 fL (ref 80.0–100.0)
Platelets: 193 10*3/uL (ref 150–400)
RBC: 4.01 MIL/uL — ABNORMAL LOW (ref 4.22–5.81)
RDW: 15.9 % — ABNORMAL HIGH (ref 11.5–15.5)
WBC: 13.1 10*3/uL — ABNORMAL HIGH (ref 4.0–10.5)
nRBC: 0 % (ref 0.0–0.2)

## 2019-06-04 LAB — BASIC METABOLIC PANEL
Anion gap: 10 (ref 5–15)
BUN: 12 mg/dL (ref 6–20)
CO2: 24 mmol/L (ref 22–32)
Calcium: 8.3 mg/dL — ABNORMAL LOW (ref 8.9–10.3)
Chloride: 102 mmol/L (ref 98–111)
Creatinine, Ser: 1.15 mg/dL (ref 0.61–1.24)
GFR calc Af Amer: >60 mL/min (ref >60)
GFR calc non Af Amer: >60 mL/min (ref >60)
Glucose, Bld: 195 mg/dL — ABNORMAL HIGH (ref 70–99)
Potassium: 4.1 mmol/L (ref 3.5–5.1)
Sodium: 136 mmol/L (ref 135–145)

## 2019-06-04 MED ORDER — MORPHINE SULFATE (PF) 2 MG/ML IV SOLN
1.0000 mg | INTRAVENOUS | Status: DC | PRN
  Administered 2019-06-04 – 2019-06-06 (×8): 1 mg via INTRAVENOUS
  Filled 2019-06-04 (×9): qty 1

## 2019-06-04 NOTE — Progress Notes (Signed)
Physical Therapy Treatment Patient Details Name: Joshua Mays MRN: [id number] DOB: 07/19/1966 Today's Date: 06/04/2019    History of Present Illness 53 y/o male s/p R total hip replacement 06/03/19, he is s/p L total hip ~3.5 months ago and has recovered relatively well    PT Comments    Multiple attempts this afternoon, pt having a lot of pain. Coordinated with nursing regarding pain meds as well as possible.  Pt still able to walk out into the hallway and participate with exercises but much more pain limited and guarded to/ the session.   Follow Up Recommendations  Follow surgeon's recommendation for DC plan and follow-up therapies     Equipment Recommendations  None recommended by PT    Recommendations for Other Services       Precautions / Restrictions Precautions Precautions: Fall;Anterior Hip Restrictions RLE Weight Bearing: Weight bearing as tolerated    Mobility  Bed Mobility Overal bed mobility: Modified Independent             General bed mobility comments: Pt needing a lot more time and effort to get LEs back up into bed to get to supine.  Heavy UE to lift R LE up into bed.  Transfers Overall transfer level: Modified independent Equipment used: Rolling walker (2 wheeled)             General transfer comment: Pt needing more cuing, prep time and encouargement to manage getting to standing this session  Ambulation/Gait Ambulation/Gait assistance: Supervision Gait Distance (Feet): 75 Feet Assistive device: Rolling walker (2 wheeled)       General Gait Details: Pt much slower and walker reliant this afternoon. Minimal step length, signficant limp/guarding, however able to maintain good posture and attitude despite pain.   Stairs             Wheelchair Mobility    Modified Rankin (Stroke Patients Only)       Balance Overall balance assessment: Modified Independent                                           Cognition Arousal/Alertness: Awake/alert Behavior During Therapy: WFL for tasks assessed/performed Overall Cognitive Status: Within Functional Limits for tasks assessed                                        Exercises Total Joint Exercises Ankle Circles/Pumps: Strengthening;10 reps Quad Sets: Strengthening;10 reps Short Arc Quad: Strengthening;10 reps Heel Slides: AROM;10 reps(with resisted leg extensions) Hip ABduction/ADduction: Strengthening;10 reps;AROM Long Arc Quad: Strengthening;10 reps    General Comments        Pertinent Vitals/Pain Pain Score: 9     Home Living                      Prior Function            PT Goals (current goals can now be found in the care plan section) Progress towards PT goals: (pain limited t/o the session)    Frequency    BID      PT Plan Current plan remains appropriate    Co-evaluation              AM-PAC PT "6 Clicks" Mobility   Outcome Measure  Help needed turning from your  back to your side while in a flat bed without using bedrails?: A Little Help needed moving from lying on your back to sitting on the side of a flat bed without using bedrails?: A Little Help needed moving to and from a bed to a chair (including a wheelchair)?: A Little Help needed standing up from a chair using your arms (e.g., wheelchair or bedside chair)?: A Little Help needed to walk in hospital room?: A Little Help needed climbing 3-5 steps with a railing? : A Lot 6 Click Score: 17    End of Session Equipment Utilized During Treatment: Gait belt Activity Tolerance: Patient tolerated treatment well Patient left: with call bell/phone within reach;with chair alarm set   PT Visit Diagnosis: Muscle weakness (generalized) (M62.81);Difficulty in walking, not elsewhere classified (R26.2);Pain Pain - Right/Left: Right Pain - part of body: Hip     Time: [id number] PT Time Calculation (min) (ACUTE ONLY): 33  min  Charges:  $Gait Training: 8-22 mins $Therapeutic Exercise: 8-22 mins                     Kreg Shropshire, DPT 06/04/2019, 6:15 PM

## 2019-06-04 NOTE — Progress Notes (Signed)
Subjective:  Patient reports pain as moderate.  Objective:   VITALS:   Vitals:   06/04/19 0304 06/04/19 0356 06/04/19 0533 06/04/19 0835  BP:   117/80 123/83  Pulse:   (!) 109 98  Resp: (!) 7 15 16    Temp:   97.7 F (36.5 C) 97.6 F (36.4 C)  TempSrc:   Oral Oral  SpO2: 97% 96% 98% 95%  Weight:      Height:        PHYSICAL EXAM:  ABD soft Neurovascular intact Dorsiflexion/Plantar flexion intact Incision: moderate drainage No cellulitis present Compartment soft  LABS  Results for orders placed or performed during the hospital encounter of 06/03/19 (from the past 24 hour(s))  Glucose, capillary     Status: Abnormal   Collection Time: 06/03/19  3:21 PM  Result Value Ref Range   Glucose-Capillary 157 (H) 70 - 99 mg/dL  CBC     Status: Abnormal   Collection Time: 06/04/19  6:28 AM  Result Value Ref Range   WBC 13.1 (H) 4.0 - 10.5 K/uL   RBC 4.01 (L) 4.22 - 5.81 MIL/uL   Hemoglobin 11.3 (L) 13.0 - 17.0 g/dL   HCT 35.8 (L) 39.0 - 52.0 %   MCV 89.3 80.0 - 100.0 fL   MCH 28.2 26.0 - 34.0 pg   MCHC 31.6 30.0 - 36.0 g/dL   RDW 15.9 (H) 11.5 - 15.5 %   Platelets 193 150 - 400 K/uL   nRBC 0.0 0.0 - 0.2 %  Basic metabolic panel     Status: Abnormal   Collection Time: 06/04/19  6:28 AM  Result Value Ref Range   Sodium 136 135 - 145 mmol/L   Potassium 4.1 3.5 - 5.1 mmol/L   Chloride 102 98 - 111 mmol/L   CO2 24 22 - 32 mmol/L   Glucose, Bld 195 (H) 70 - 99 mg/dL   BUN 12 6 - 20 mg/dL   Creatinine, Ser 1.15 0.61 - 1.24 mg/dL   Calcium 8.3 (L) 8.9 - 10.3 mg/dL   GFR calc non Af Amer >60 >60 mL/min   GFR calc Af Amer >60 >60 mL/min   Anion gap 10 5 - 15    Dg Pelvis Portable  Result Date: 06/03/2019 CLINICAL DATA:  Post right hip arthroplasty. EXAM: PORTABLE PELVIS 1-2 VIEWS COMPARISON:  June 03, 2019 FINDINGS: Post recent total right hip arthroplasty with normal alignment of the prostatic components. No evidence of fracture. Expected soft tissue edema. Skin  staples in place. Prior left hip arthroplasty. IMPRESSION: Post recent total right hip arthroplasty without evidence of immediate complications. Electronically Signed   By: Fidela Salisbury M.D.   On: 06/03/2019 15:56   Dg Hip Operative Unilat W Or W/o Pelvis Right  Result Date: 06/03/2019 CLINICAL DATA:  Right hip arthroplasty. EXAM: OPERATIVE right HIP (WITH PELVIS IF PERFORMED) 2 VIEWS TECHNIQUE: Fluoroscopic spot image(s) were submitted for interpretation post-operatively. COMPARISON:  Pelvic and left hip radiographs 09/23/2018. FINDINGS: 1 minutes and 18 seconds of fluoroscopy provided. 2 spot fluoroscopic views demonstrate interval right total hip arthroplasty without demonstrated complication. Bone detail is limited. IMPRESSION: Intraoperative views following right total hip arthroplasty. No demonstrated complications. Electronically Signed   By: Richardean Sale M.D.   On: 06/03/2019 14:57    Assessment/Plan: 1 Day Post-Op   Active Problems:   Status post total hip replacement, right   Advance diet Up with therapy D/C IV fluids Plan for discharge tomorrow  His dressing is changed  Lovell Sheehan , MD 06/04/2019, 11:57 AM

## 2019-06-04 NOTE — Evaluation (Signed)
Physical Therapy Evaluation Patient Details Name: Joshua Mays MRN: [id number] DOB: [birth year] Today's Date: 06/04/2019   History of Present Illness  53 y/o male s/p R total hip replacement 06/03/19, he is s/p L total hip ~3.5 months ago and has recovered relatively well  Clinical Impression  Pt was eager to work with PT and was overall good with mobility, ambulation, etc.  He had the L hip replaced a few months ago and is eager to now improve with R and regain function.  He reports more pain this time post-op than other side, pt showed good strength and effort with ambulation and exercises.  He will have 24/7 assist when he first leaves as he is going to d/c to his mother's home.      Follow Up Recommendations Follow surgeon's recommendation for DC plan and follow-up therapies    Equipment Recommendations  None recommended by PT    Recommendations for Other Services       Precautions / Restrictions Precautions Precautions: Fall;Anterior Hip Restrictions RLE Weight Bearing: Weight bearing as tolerated      Mobility  Bed Mobility Overal bed mobility: Independent             General bed mobility comments: Pt able to easily get himself up to sitting EOB  Transfers Overall transfer level: Independent Equipment used: Rolling walker (2 wheeled)             General transfer comment: minimal cuing for set up, but pt able to rise w/o direct assist, able to maintain balance w/o AD  Ambulation/Gait Ambulation/Gait assistance: Supervision Gait Distance (Feet): 200 Feet Assistive device: Rolling walker (2 wheeled)       General Gait Details: Pt initially with slow, cautious gait with some issues advancing R LE in continuous cadence, improved with cuing and increased time ambulating.  Pt ultimately did well and showed good safety and confidence with prolonged bout of ambulation  Stairs            Wheelchair Mobility    Modified Rankin (Stroke Patients Only)        Balance Overall balance assessment: Modified Independent                                           Pertinent Vitals/Pain Pain Assessment: 0-10 Pain Score: 8  Pain Location: buring on anterior incision site    Home Living Family/patient expects to be discharged to:: Private residence Living Arrangements: Alone Available Help at Discharge: Family(Pt will be d/c'ing to mother's home) Type of Home: House Home Access: Stairs to enter   [employer] of Steps: 2   Home Equipment: Walker - 2 wheels;Cane - single point      Prior Function Level of Independence: Independent         Comments: Independent; has ileostomy.      Hand Dominance   Dominant Hand: Right    Extremity/Trunk Assessment   Upper Extremity Assessment Upper Extremity Assessment: Overall WFL for tasks assessed    Lower Extremity Assessment Lower Extremity Assessment: Overall WFL for tasks assessed(expected post-op weakness, but functional t/o)       Communication   Communication: No difficulties  Cognition Arousal/Alertness: Awake/alert Behavior During Therapy: WFL for tasks assessed/performed Overall Cognitive Status: Within Functional Limits for tasks assessed  General Comments      Exercises Total Joint Exercises Ankle Circles/Pumps: AROM;20 reps Quad Sets: Strengthening;10 reps Heel Slides: Strengthening;10 reps Hip ABduction/ADduction: Strengthening;10 reps Long Arc Quad: Strengthening;10 reps Marching in Standing: 10 reps;AROM;Seated   Assessment/Plan    PT Assessment Patient needs continued PT services  PT Problem List Decreased strength;Decreased range of motion;Decreased activity tolerance;Decreased balance;Decreased mobility;Decreased coordination;Decreased cognition;Decreased knowledge of use of DME;Decreased safety awareness;Pain       PT Treatment Interventions DME instruction;Gait  training;Stair training;Therapeutic exercise;Functional mobility training;Therapeutic activities;Balance training;Neuromuscular re-education;Patient/family education    PT Goals (Current goals can be found in the Care Plan section)  Acute Rehab PT Goals Patient Stated Goal: get back to walking his dog QD PT Goal Formulation: With patient Time For Goal Achievement: 06/18/19 Potential to Achieve Goals: Good    Frequency BID   Barriers to discharge        Co-evaluation               AM-PAC PT "6 Clicks" Mobility  Outcome Measure Help needed turning from your back to your side while in a flat bed without using bedrails?: None Help needed moving from lying on your back to sitting on the side of a flat bed without using bedrails?: None Help needed moving to and from a bed to a chair (including a wheelchair)?: None Help needed standing up from a chair using your arms (e.g., wheelchair or bedside chair)?: None Help needed to walk in hospital room?: None Help needed climbing 3-5 steps with a railing? : A Little 6 Click Score: 23    End of Session Equipment Utilized During Treatment: Gait belt Activity Tolerance: Patient tolerated treatment well Patient left: with call bell/phone within reach;with chair alarm set Nurse Communication: Mobility status PT Visit Diagnosis: Muscle weakness (generalized) (M62.81);Difficulty in walking, not elsewhere classified (R26.2);Pain Pain - Right/Left: Right Pain - part of body: Hip    Time: [id number] PT Time Calculation (min) (ACUTE ONLY): 35 min   Charges:   PT Evaluation $PT Eval Low Complexity: 1 Low PT Treatments $Gait Training: 8-22 mins $Therapeutic Exercise: 8-22 mins        Kreg Shropshire, DPT 06/04/2019, 12:15 PM

## 2019-06-04 NOTE — TOC Initial Note (Signed)
Transition of Care [facility]) - Initial/Assessment Note    Patient Details  Name: Joshua Mays MRN: [id number] Date of Birth: [birth year]  Transition of Care [facility]) CM/SW Contact:    Su Hilt, RN Phone Number: 06/04/2019, 2:10 PM  Clinical Narrative:                 Met with the patient to discuss DC plan and needs, He lives at home alone and he will be going to his mothers home at [place], she provides transportation and will take him to his Outpatient PT appointments already set up, He needs a RW and I notified Brad with [employer]. He declines the need for [facility] PT He stated he is up to date with his PCP and can afford his medications No additional needs  Expected Discharge Plan: Home/Self Care Barriers to Discharge: Continued Medical Work up   Patient Goals and CMS Choice Patient states their goals for this hospitalization and ongoing recovery are:: go to his moms CMS Medicare.gov Compare Post Acute Care list provided to:: Patient Choice offered to / list presented to : Patient  Expected Discharge Plan and Services Expected Discharge Plan: Home/Self Care   Discharge Planning Services: CM Consult   Living arrangements for the past 2 months: Single Family Home                 DME Arranged: Walker rolling DME Agency: [employer] Date DME Agency Contacted: 06/04/19 Time DME Agency Contacted: 2 Representative spoke with at DME Agency: [place]: NA          Prior Living Arrangements/Services Living arrangements for the past 2 months: [place] Lives with:: Self Patient language and need for interpreter reviewed:: Yes Do you feel safe going back to the place where you live?: Yes      Need for Family Participation in Patient Care: No (Comment) Care giver support system in place?: Yes (comment) Current home services: DME(grab bars toilet [place]) Criminal Activity/Legal Involvement Pertinent to Current Situation/Hospitalization: No - Comment as  needed  Activities of Daily Living Home Assistive Devices/Equipment: Walker (specify type) ADL Screening (condition at time of admission) Patient's cognitive ability adequate to safely complete daily activities?: Yes Is the patient deaf or have difficulty hearing?: No Does the patient have difficulty seeing, even when wearing glasses/contacts?: No Does the patient have difficulty concentrating, remembering, or making decisions?: No Patient able to express need for assistance with ADLs?: Yes Does the patient have difficulty dressing or bathing?: Yes Independently performs ADLs?: Yes (appropriate for developmental age) Does the patient have difficulty walking or climbing stairs?: No Weakness of Legs: None Weakness of Arms/Hands: None  Permission Sought/Granted   Permission granted to share information with : Yes, Verbal Permission Granted              Emotional Assessment Appearance:: Appears stated age Attitude/Demeanor/Rapport: Engaged Affect (typically observed): Appropriate, Calm Orientation: : Oriented to Self, Oriented to Place, Oriented to  Time, Oriented to Situation Alcohol / Substance Use: Not Applicable Psych Involvement: No (comment)  Admission diagnosis:  M87.851 other osteonecrosis right femur Patient Active Problem List   Diagnosis Date Noted  . Status post total hip replacement, right 06/03/2019  . Osteonecrosis of left hip ([place]) 02/19/2019  . Type 2 diabetes mellitus without complications ([place]) 70/62/3762  . Pain of left hip joint 11/21/2018  . Low back pain 10/29/2018  . Long term current use of opiate analgesic 05/20/2017  . Long term prescription opiate use 05/20/2017  .  Opiate use 05/20/2017  . Chronic pain syndrome 05/20/2017  . Chronic hand pain (Primary Area of Pain)( Right) 05/20/2017  . Chronic upper extremity pain (Secondary Area of Pain)(right) 05/20/2017  . Paresthesia of right upper extremity 05/20/2017  . 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 H/O  ventral hernia repair 12/13/2015  . History of ileostomy 12/13/2015  . 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 Peristomal hernia 12/09/2015  . Postoperative wound infection 07/16/2015  . Bipolar I disorder, most recent episode depressed ([place]) 07/01/2015  . Borderline personality disorder ([place]) 07/01/2015  . Alcohol abuse 07/01/2015  . Wound infection after surgery 06/25/2015  . Post-operative wound abscess 06/23/2015  . Ventral hernia 06/13/2015  . 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 Hernia, umbilical 38/18/2993  . Affective bipolar disorder ([place]) 05/18/2015  . Chronic pain 05/18/2015  . CC (Crohn's colitis) ([place]) 05/18/2015  . Clinical depression 05/18/2015  . Failure of erection 05/18/2015  . 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 Personal history of urinary disorder 05/18/2015  . H/O malignant neoplasm 05/18/2015  . H/O: substance abuse ([place]) 05/18/2015  . Personal history of arthritis 05/18/2015  . BP (high blood pressure) 05/18/2015  . 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 Colitis gravis ([place]) 05/18/2015  . B12 deficiency 05/18/2015  . 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

## 2019-06-05 LAB — CBC
HCT: 34.9 % — ABNORMAL LOW (ref 39.0–52.0)
Hemoglobin: 10.7 g/dL — ABNORMAL LOW (ref 13.0–17.0)
MCH: 27.6 pg (ref 26.0–34.0)
MCHC: 30.7 g/dL (ref 30.0–36.0)
MCV: 89.9 fL (ref 80.0–100.0)
Platelets: 161 10*3/uL (ref 150–400)
RBC: 3.88 MIL/uL — ABNORMAL LOW (ref 4.22–5.81)
RDW: 16.1 % — ABNORMAL HIGH (ref 11.5–15.5)
WBC: 9.4 10*3/uL (ref 4.0–10.5)
nRBC: 0 % (ref 0.0–0.2)

## 2019-06-05 LAB — SURGICAL PATHOLOGY

## 2019-06-05 LAB — BASIC METABOLIC PANEL
Anion gap: 7 (ref 5–15)
BUN: 9 mg/dL (ref 6–20)
CO2: 29 mmol/L (ref 22–32)
Calcium: 8.7 mg/dL — ABNORMAL LOW (ref 8.9–10.3)
Chloride: 99 mmol/L (ref 98–111)
Creatinine, Ser: 0.97 mg/dL (ref 0.61–1.24)
GFR calc Af Amer: >60 mL/min (ref >60)
GFR calc non Af Amer: >60 mL/min (ref >60)
Glucose, Bld: 238 mg/dL — ABNORMAL HIGH (ref 70–99)
Potassium: 3.5 mmol/L (ref 3.5–5.1)
Sodium: 135 mmol/L (ref 135–145)

## 2019-06-05 MED ORDER — RIVAROXABAN 10 MG PO TABS: 10.0000 mg | ORAL_TABLET | Freq: Every day | ORAL | 0 refills | Status: DC

## 2019-06-05 MED ORDER — METHOCARBAMOL 500 MG PO TABS: 500.0000 mg | ORAL_TABLET | Freq: Four times a day (QID) | ORAL | 0 refills | Status: AC | PRN

## 2019-06-05 MED ORDER — OXYCODONE HCL 10 MG PO TABS: 10.0000 mg | ORAL_TABLET | ORAL | 0 refills | Status: AC

## 2019-06-05 NOTE — Progress Notes (Signed)
Physical Therapy Treatment Patient Details Name: Joshua Mays MRN: [id number] DOB: 1966/09/15 Today's Date: 06/05/2019    History of Present Illness 53 y/o male s/p R total hip replacement 06/03/19, he is s/p L total hip ~3.5 months ago and has recovered relatively well    PT Comments    Pt continues to be focused on and limited by pain, but still showed great effort and willingness to push himself with PT.  He managed the full loop around the nurses' station with more consistent and fluid cadence this afternoon (gradually becoming less reliant on the walker t/o the effort).  He was able to perform bed mobility w/o physical assist and though he still struggles with hip flexion acts (pain and weakness).    Follow Up Recommendations  Follow surgeon's recommendation for DC plan and follow-up therapies     Equipment Recommendations  Rolling walker with 5" wheels    Recommendations for Other Services       Precautions / Restrictions Precautions Precautions: Fall;Anterior Hip Restrictions Weight Bearing Restrictions: Yes RLE Weight Bearing: Weight bearing as tolerated    Mobility  Bed Mobility Overal bed mobility: Modified Independent;Needs Assistance Bed Mobility: Supine to Sit;Sit to Supine     Supine to sit: Min guard Sit to supine: Min guard   General bed mobility comments: Pt was able to get himself to EOB w/o assist, more time and need for UEs to get R LE back up into bed, but able to do so without direct assistance  Transfers Overall transfer level: Modified independent Equipment used: Rolling walker (2 wheeled)             General transfer comment: Pt able to rise with improved confidence this afternoon, still stiff and guarded but not needing phyiscal assist  Ambulation/Gait Ambulation/Gait assistance: Supervision Gait Distance (Feet): 200 Feet Assistive device: Rolling walker (2 wheeled)       General Gait Details: Pt with slow, but more consistent and  fluid cadence this afternoon.    Stairs             Wheelchair Mobility    Modified Rankin (Stroke Patients Only)       Balance Overall balance assessment: Modified Independent                                          Cognition Arousal/Alertness: Awake/alert Behavior During Therapy: WFL for tasks assessed/performed Overall Cognitive Status: Within Functional Limits for tasks assessed                                        Exercises Total Joint Exercises Ankle Circles/Pumps: AROM;10 reps Quad Sets: Strengthening;20 reps Short Arc Quad: Strengthening;15 reps Heel Slides: AROM;10 reps(resisted leg extensions) Hip ABduction/ADduction: AROM;10 reps    General Comments        Pertinent Vitals/Pain Pain Assessment: 0-10 Pain Score: 7  Pain Location: buring on anterior incision site, also c/o deep "butt" pain    Home Living                      Prior Function            PT Goals (current goals can now be found in the care plan section) Progress towards PT goals: Progressing toward goals  Frequency    BID      PT Plan Current plan remains appropriate    Co-evaluation              AM-PAC PT "6 Clicks" Mobility   Outcome Measure  Help needed turning from your back to your side while in a flat bed without using bedrails?: None Help needed moving from lying on your back to sitting on the side of a flat bed without using bedrails?: A Little Help needed moving to and from a bed to a chair (including a wheelchair)?: A Little Help needed standing up from a chair using your arms (e.g., wheelchair or bedside chair)?: A Little Help needed to walk in hospital room?: A Little Help needed climbing 3-5 steps with a railing? : A Little 6 Click Score: 19    End of Session Equipment Utilized During Treatment: Gait belt Activity Tolerance: Patient tolerated treatment well Patient left: with call bell/phone within  reach;with chair alarm set Nurse Communication: Mobility status PT Visit Diagnosis: Muscle weakness (generalized) (M62.81);Difficulty in walking, not elsewhere classified (R26.2);Pain Pain - Right/Left: Right Pain - part of body: Hip     Time: [id number] PT Time Calculation (min) (ACUTE ONLY): 28 min  Charges:  $Gait Training: 8-22 mins $Therapeutic Exercise: 8-22 mins                     Kreg Shropshire, DPT 06/05/2019, 4:16 PM

## 2019-06-05 NOTE — Progress Notes (Signed)
Physical Therapy Treatment Patient Details Name: Joshua Mays MRN: [id number] DOB: 06/04/1966 Today's Date: 06/05/2019    History of Present Illness 53 y/o male s/p R total hip replacement 06/03/19, he is s/p L total hip ~3.5 months ago and has recovered relatively well    PT Comments    Pt continues to be focused heavily on pain and though he showed good motivation and was able to do full loop of ambulation with stairs he was self limiting because of pain.  Ambulation did steadily improve with increased time though still very reliant on UEs t/o the effort.  He struggled with exercises that included against gravity of specifically hip flexion focus, though again this improved somewhat with increased encouragement and reps.  Pt anxious about going home and perseverating on pain meds t/o the session, is making steady improvements with strength, mobility, ambulation.   Follow Up Recommendations  Follow surgeon's recommendation for DC plan and follow-up therapies     Equipment Recommendations  None recommended by PT    Recommendations for Other Services       Precautions / Restrictions Precautions Precautions: Fall;Anterior Hip Precaution Booklet Issued: Yes (comment) Restrictions RLE Weight Bearing: Weight bearing as tolerated    Mobility  Bed Mobility Overal bed mobility: Modified Independent             General bed mobility comments: Pt using L LE and b/l UEs to ease R LE off EOB, but was able to slowly but w/o direct assist attain sitting EOB  Transfers Overall transfer level: Modified independent Equipment used: Rolling walker (2 wheeled)             General transfer comment: Heavy use of UEs to get to standing from standard height bed height.  Pt able to rise to standing w/o assist, though he remained guarded and cautious t/o the effort.   Ambulation/Gait Ambulation/Gait assistance: Supervision Gait Distance (Feet): 250 Feet Assistive device: Rolling walker  (2 wheeled)       General Gait Details: Pt continues to be very slow and guarded with ambulation, though this did improve somewhat with increased time.  Pt's vitals remained stable as did his pain t/o the session.     Stairs Stairs: Yes Stairs assistance: Supervision Stair Management: No rails;With walker;Forwards Number of Stairs: 4 General stair comments: Pt has just 2 steps to get into home, was able to negotiate up/down 4 steps w/o direct assist.  Pt did need repeated cuing for sequencing but did surprisingly well managing steps w/o rails.     Wheelchair Mobility    Modified Rankin (Stroke Patients Only)       Balance Overall balance assessment: Modified Independent                                          Cognition Arousal/Alertness: Awake/alert Behavior During Therapy: WFL for tasks assessed/performed Overall Cognitive Status: Within Functional Limits for tasks assessed                                        Exercises Total Joint Exercises Ankle Circles/Pumps: Strengthening;10 reps Quad Sets: Strengthening;20 reps Gluteal Sets: AROM;10 reps Short Arc Quad: Strengthening;15 reps Heel Slides: AROM;AAROM;10 reps Hip ABduction/ADduction: 10 reps;AROM Straight Leg Raises: AROM;10 reps    General Comments  Pertinent Vitals/Pain Pain Assessment: 0-10 Pain Score: 8  Pain Location: buring on anterior incision site    Home Living                      Prior Function            PT Goals (current goals can now be found in the care plan section) Progress towards PT goals: Progressing toward goals    Frequency    BID      PT Plan Current plan remains appropriate    Co-evaluation              AM-PAC PT "6 Clicks" Mobility   Outcome Measure  Help needed turning from your back to your side while in a flat bed without using bedrails?: A Little Help needed moving from lying on your back to sitting on  the side of a flat bed without using bedrails?: A Little Help needed moving to and from a bed to a chair (including a wheelchair)?: A Little Help needed standing up from a chair using your arms (e.g., wheelchair or bedside chair)?: A Little Help needed to walk in hospital room?: A Little Help needed climbing 3-5 steps with a railing? : A Little 6 Click Score: 18    End of Session Equipment Utilized During Treatment: Gait belt Activity Tolerance: Patient tolerated treatment well Patient left: with call bell/phone within reach;with chair alarm set Nurse Communication: Mobility status PT Visit Diagnosis: Muscle weakness (generalized) (M62.81);Difficulty in walking, not elsewhere classified (R26.2);Pain Pain - Right/Left: Right Pain - part of body: Hip     Time: [id number] PT Time Calculation (min) (ACUTE ONLY): 42 min  Charges:  $Gait Training: 23-37 mins $Therapeutic Exercise: 8-22 mins                     Kreg Shropshire, DPT 06/05/2019, 10:43 AM

## 2019-06-05 NOTE — Progress Notes (Signed)
  Subjective:  Patient reports pain as moderate.  6/10 when at rest, 9/10 while up  Objective:   VITALS:   Vitals:   06/05/19 0047 06/05/19 0249 06/05/19 0512 06/05/19 0816  BP: 114/83 127/86 126/85 (!) 157/101  Pulse: (!) 116 (!) 113 (!) 117 (!) 108  Resp: 18 16  17   Temp:  98.7 F (37.1 C) 99.4 F (37.4 C) 98.3 F (36.8 C)  TempSrc:  Oral Oral   SpO2: 97% 96% 93% 97%  Weight:      Height:        PHYSICAL EXAM:  Neurologically intact ABD soft Neurovascular intact Sensation intact distally Intact pulses distally Dorsiflexion/Plantar flexion intact Incision: dressing C/D/I No cellulitis present Compartment soft  LABS  Results for orders placed or performed during the hospital encounter of 06/03/19 (from the past 24 hour(s))  CBC     Status: Abnormal   Collection Time: 06/05/19  5:26 AM  Result Value Ref Range   WBC 9.4 4.0 - 10.5 K/uL   RBC 3.88 (L) 4.22 - 5.81 MIL/uL   Hemoglobin 10.7 (L) 13.0 - 17.0 g/dL   HCT 34.9 (L) 39.0 - 52.0 %   MCV 89.9 80.0 - 100.0 fL   MCH 27.6 26.0 - 34.0 pg   MCHC 30.7 30.0 - 36.0 g/dL   RDW 16.1 (H) 11.5 - 15.5 %   Platelets 161 150 - 400 K/uL   nRBC 0.0 0.0 - 0.2 %    Dg Pelvis Portable  Result Date: 06/03/2019 CLINICAL DATA:  Post right hip arthroplasty. EXAM: PORTABLE PELVIS 1-2 VIEWS COMPARISON:  June 03, 2019 FINDINGS: Post recent total right hip arthroplasty with normal alignment of the prostatic components. No evidence of fracture. Expected soft tissue edema. Skin staples in place. Prior left hip arthroplasty. IMPRESSION: Post recent total right hip arthroplasty without evidence of immediate complications. Electronically Signed   By: Fidela Salisbury M.D.   On: 06/03/2019 15:56   Dg Hip Operative Unilat W Or W/o Pelvis Right  Result Date: 06/03/2019 CLINICAL DATA:  Right hip arthroplasty. EXAM: OPERATIVE right HIP (WITH PELVIS IF PERFORMED) 2 VIEWS TECHNIQUE: Fluoroscopic spot image(s) were submitted for  interpretation post-operatively. COMPARISON:  Pelvic and left hip radiographs 09/23/2018. FINDINGS: 1 minutes and 18 seconds of fluoroscopy provided. 2 spot fluoroscopic views demonstrate interval right total hip arthroplasty without demonstrated complication. Bone detail is limited. IMPRESSION: Intraoperative views following right total hip arthroplasty. No demonstrated complications. Electronically Signed   By: Richardean Sale M.D.   On: 06/03/2019 14:57    Assessment/Plan: 2 Days Post-Op   Active Problems:   Status post total hip replacement, right   Advance diet Up with therapy  D/c HOME TOMORROW   Joshua Mays , PA-C 06/05/2019, 8:17 AM

## 2019-06-05 NOTE — Progress Notes (Addendum)
Pt HR 121. Yellow mews protocol initiated. Dr. Miller/emerge on call Dr. Curly Rim. Waiting for a call back.

## 2019-06-05 NOTE — Progress Notes (Deleted)
MEWS Guidelines - (patients age 54 and over)  Yellow - At risk for Deterioration  1. Go to room and assess patient 2. Validate data. Is this patient's baseline? If data confirmed: 3. Is this an acute change? 4. Administer prn meds/treatments as ordered? 5. Note Sepsis score 6. Review goals of care 7. Sports coach and Provider 8. Call RRT nurse as needed. 9. Document patient condition/interventions/response. 10. Increase frequency of vital signs and focused assessments to at least q2h x2. - If stable, then q4h x2 and then q8h or dept. routine. - If unstable, contact Provider & RRT nurse. Prepare for possible transfer. 11. Add entry in progress notes using the smart phrase ".MEWS".

## 2019-06-06 NOTE — Progress Notes (Signed)
Physical Therapy Treatment Patient Details Name: Joshua Mays MRN: [id number] DOB: 08-21-1966 Today's Date: 06/06/2019    History of Present Illness 53 y/o male s/p R total hip replacement 06/03/19, he is s/p L total hip ~3.5 months ago and has recovered relatively well    PT Comments    Patient in bed upon PT arrival, eager to participate in physical therapy. Able to transfer to EOB with increased time and no physical assistance. Transfer to standing was performed safely with no cueing required. Ambulation initially limited with weight acceptance on RLE however improved with repetition and cueing for exhaling during weightbearing with use of purse lipped breathing. Patient ambulated around the nursing station with RW with slow velocity and safe technique. Current POC remains appropriate at this time.    Follow Up Recommendations  Follow surgeon's recommendation for DC plan and follow-up therapies     Equipment Recommendations  Rolling walker with 5" wheels    Recommendations for Other Services       Precautions / Restrictions Precautions Precautions: Fall;Anterior Hip Restrictions Weight Bearing Restrictions: Yes RLE Weight Bearing: Weight bearing as tolerated Other Position/Activity Restrictions: anterior hip    Mobility  Bed Mobility Overal bed mobility: Modified Independent;Needs Assistance Bed Mobility: Supine to Sit;Sit to Supine     Supine to sit: Min guard Sit to supine: Min guard   General bed mobility comments: Pt was able to get himself to EOB w/o assist, more time and need for UEs to get R LE back up into bed, but able to do so without direct assistance  Transfers Overall transfer level: Modified independent Equipment used: Rolling walker (2 wheeled)             General transfer comment: Transfer with CGA, no cues required for proper hand placement  Ambulation/Gait Ambulation/Gait assistance: Supervision Gait Distance (Feet): 200 Feet Assistive  device: Rolling walker (2 wheeled)       General Gait Details: decreased stance phase on RLE, improved fluidity/cadence from previous sessions. limited velocity but safe   Stairs             Wheelchair Mobility    Modified Rankin (Stroke Patients Only)       Balance Overall balance assessment: Modified Independent                                          Cognition Arousal/Alertness: Awake/alert Behavior During Therapy: WFL for tasks assessed/performed Overall Cognitive Status: Within Functional Limits for tasks assessed                                 General Comments: eager to go home      Exercises Total Joint Exercises Ankle Circles/Pumps: AROM;10 reps Quad Sets: Strengthening;20 reps Short Arc Quad: Strengthening;15 reps Heel Slides: AROM;10 reps(resisted leg extensions) Hip ABduction/ADduction: AROM;10 reps Other Exercises Other Exercises: Patient educated on purse lipped breathing for pain control and ambulation. Patient ambulation improved with repetition allowing for improved weight acceptance and mobilization of right LE.    General Comments        Pertinent Vitals/Pain Pain Assessment: 0-10 Pain Score: 5  Pain Location: pain upon weightbearing with ambulation Pain Descriptors / Indicators: Aching Pain Intervention(s): Monitored during session;Repositioned;Utilized relaxation techniques    Home Living  Prior Function            PT Goals (current goals can now be found in the care plan section) Progress towards PT goals: Progressing toward goals    Frequency    BID      PT Plan Current plan remains appropriate    Co-evaluation              AM-PAC PT "6 Clicks" Mobility   Outcome Measure  Help needed turning from your back to your side while in a flat bed without using bedrails?: None Help needed moving from lying on your back to sitting on the side of a flat bed  without using bedrails?: A Little Help needed moving to and from a bed to a chair (including a wheelchair)?: A Little Help needed standing up from a chair using your arms (e.g., wheelchair or bedside chair)?: A Little Help needed to walk in hospital room?: A Little Help needed climbing 3-5 steps with a railing? : A Little 6 Click Score: 19    End of Session Equipment Utilized During Treatment: Gait belt Activity Tolerance: Patient tolerated treatment well Patient left: with call bell/phone within reach;with chair alarm set Nurse Communication: Mobility status PT Visit Diagnosis: Muscle weakness (generalized) (M62.81);Difficulty in walking, not elsewhere classified (R26.2);Pain Pain - Right/Left: Right Pain - part of body: Hip     Time: 1017-1040 PT Time Calculation (min) (ACUTE ONLY): 23 min  Charges:  $Gait Training: 8-22 mins $Therapeutic Exercise: 8-22 mins                     Janna Arch, PT, DPT    Janna Arch 06/06/2019, 10:50 AM

## 2019-06-06 NOTE — Discharge Instructions (Signed)

## 2019-06-06 NOTE — Progress Notes (Signed)
  Subjective:  Patient reports pain as mild to moderate.  Doing better this am.  Objective:   VITALS:   Vitals:   06/05/19 0816 06/05/19 1521 06/05/19 2253 06/06/19 0733  BP: (!) 157/101 (!) 142/92 100/76 123/78  Pulse: (!) 108 (!) 102 (!) 117 98  Resp: 17 17 19 17   Temp: 98.3 F (36.8 C) 99 F (37.2 C) 98.6 F (37 C) 98.2 F (36.8 C)  TempSrc:  Oral Oral   SpO2: 97% 94% 92% 93%  Weight:      Height:        PHYSICAL EXAM:  Neurologically intact ABD soft Neurovascular intact Sensation intact distally Intact pulses distally Dorsiflexion/Plantar flexion intact Incision: scant drainage No cellulitis present Compartment soft  New dressing placed today  LABS  No results found for this or any previous visit (from the past 24 hour(s)).  No results found.  Assessment/Plan: 3 Days Post-Op   Active Problems:   Status post total hip replacement, right   Advance diet Up with therapy  Discharge home today   Carlynn Spry , PA-C 06/06/2019, 9:08 AM
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
## 2019-06-06 NOTE — Progress Notes (Signed)
PT Cancellation Note  Patient Details Name: Joshua Mays MRN: [id number] DOB: 1966-06-05   Cancelled Treatment:    Reason Eval/Treat Not Completed: Other (comment)(Patient eating breakfast upon PT attempt. Will attemt again at later time this morning when patient is available.)  Janna Arch, PT, DPT   06/06/2019, 8:29 AM

## 2019-06-06 NOTE — TOC Transition Note (Signed)
Transition of Care [facility]) - CM/SW Discharge Note   Patient Details  Name: Joshua Mays MRN: [id number] Date of Birth: [birth year]  [facility]) CM/SW Contact:  Latanya Maudlin, RN Phone Number: 06/06/2019, 9:44 AM   Clinical Narrative: Patient set to discharge with outpatient PT/OT. Scheduled has been established and patient will be able to get transport. Rolling walker was brought to room via [employer]. NO further TOC team needs.       Final next level of care: Home w Home Health Services Barriers to Discharge: No Barriers Identified   Patient Goals and CMS Choice Patient states their goals for this hospitalization and ongoing recovery are:: go to his moms CMS Medicare.gov Compare Post Acute Care list provided to:: Patient Choice offered to / list presented to : Patient  Discharge Placement                       Discharge Plan and Services   Discharge Planning Services: CM Consult            DME Arranged: Gilford Rile rolling DME Agency: [employer] Date DME Agency Contacted: 06/04/19 Time DME Agency Contacted: [id number] Representative spoke with at DME Agency: [place]: NA          Social Determinants of Health ([place]) Interventions     Readmission Risk Interventions Readmission Risk Prevention Plan 06/06/2019 06/04/2019 02/20/2019  Transportation Screening Complete Complete Complete  PCP or Specialist Appt within 3-5 Days Complete Complete -  HRI or Home Care Consult Complete Complete Complete  Social Work Consult for [place] Planning/Counseling Complete Complete -  Palliative Care Screening Complete Not Applicable -  Medication Review Press photographer) Complete - Complete  Some recent data might be hidden

## 2019-08-07 ENCOUNTER — Ambulatory Visit (INDEPENDENT_AMBULATORY_CARE_PROVIDER_SITE_OTHER): Payer: Medicaid Other | Admitting: Family Medicine

## 2019-08-07 ENCOUNTER — Other Ambulatory Visit: Payer: Self-pay

## 2019-08-07 ENCOUNTER — Encounter: Payer: Self-pay | Admitting: Family Medicine

## 2019-08-07 VITALS — BP 131/91 | HR 103 | Temp 97.1°F | Resp 18 | Ht 71.0 in | Wt 225.6 lb

## 2019-08-07 DIAGNOSIS — F3130 Bipolar disorder, current episode depressed, mild or moderate severity, unspecified: Secondary | ICD-10-CM

## 2019-08-07 DIAGNOSIS — E119 Type 2 diabetes mellitus without complications: Secondary | ICD-10-CM

## 2019-08-07 DIAGNOSIS — Z96643 Presence of artificial hip joint, bilateral: Secondary | ICD-10-CM

## 2019-08-07 DIAGNOSIS — Z9889 Other specified postprocedural states: Secondary | ICD-10-CM | POA: Diagnosis not present

## 2019-08-07 LAB — POCT GLYCOSYLATED HEMOGLOBIN (HGB A1C)
Est. average glucose Bld gHb Est-mCnc: 157
Hemoglobin A1C: 7.1 % — AB (ref 4.0–5.6)

## 2019-08-07 MED ORDER — QUETIAPINE FUMARATE 400 MG PO TABS: 400.0000 mg | ORAL_TABLET | Freq: Every day | ORAL | 1 refills | Status: DC
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
## 2019-08-29 ENCOUNTER — Other Ambulatory Visit: Payer: Self-pay | Admitting: Family Medicine

## 2019-08-29 DIAGNOSIS — F3130 Bipolar disorder, current episode depressed, mild or moderate severity, unspecified: Secondary | ICD-10-CM

## 2019-10-09 NOTE — Progress Notes (Signed)
Called patient X2, once at 3:58 PM and once at 4:07 PM with no answer. There is not an option to leave a message. No Show. Patient will need to reschedule if he calls back.

## 2019-10-10 LAB — COMPREHENSIVE METABOLIC PANEL
ALT: 87 IU/L — ABNORMAL HIGH (ref 0–44)
AST: 64 IU/L — ABNORMAL HIGH (ref 0–40)
Albumin/Globulin Ratio: 1.4 (ref 1.2–2.2)
Albumin: 4.1 g/dL (ref 3.8–4.9)
Alkaline Phosphatase: 170 IU/L — ABNORMAL HIGH (ref 39–117)
BUN/Creatinine Ratio: 7 — ABNORMAL LOW (ref 9–20)
BUN: 8 mg/dL (ref 6–24)
Bilirubin Total: 0.7 mg/dL (ref 0.0–1.2)
CO2: 18 mmol/L — ABNORMAL LOW (ref 20–29)
Calcium: 9.2 mg/dL (ref 8.7–10.2)
Chloride: 94 mmol/L — ABNORMAL LOW (ref 96–106)
Creatinine, Ser: 1.12 mg/dL (ref 0.76–1.27)
GFR calc Af Amer: 86 mL/min/{1.73_m2} (ref >59)
GFR calc non Af Amer: 74 mL/min/{1.73_m2} (ref >59)
Globulin, Total: 2.9 g/dL (ref 1.5–4.5)
Glucose: 367 mg/dL — ABNORMAL HIGH (ref 65–99)
Potassium: 4 mmol/L (ref 3.5–5.2)
Sodium: 133 mmol/L — ABNORMAL LOW (ref 134–144)
Total Protein: 7 g/dL (ref 6.0–8.5)

## 2019-10-10 LAB — LIPID PANEL
Chol/HDL Ratio: 3.3 ratio (ref 0.0–5.0)
Cholesterol, Total: 196 mg/dL (ref 100–199)
HDL: 60 mg/dL (ref >39)
LDL Chol Calc (NIH): 104 mg/dL — ABNORMAL HIGH (ref 0–99)
Triglycerides: 185 mg/dL — ABNORMAL HIGH (ref 0–149)
VLDL Cholesterol Cal: 32 mg/dL (ref 5–40)

## 2019-10-10 LAB — CBC WITH DIFFERENTIAL/PLATELET
Basophils Absolute: 0.1 10*3/uL (ref 0.0–0.2)
Basos: 1 %
EOS (ABSOLUTE): 0.1 10*3/uL (ref 0.0–0.4)
Eos: 1 %
Hematocrit: 42.5 % (ref 37.5–51.0)
Hemoglobin: 14.4 g/dL (ref 13.0–17.7)
Immature Grans (Abs): 0.1 10*3/uL (ref 0.0–0.1)
Immature Granulocytes: 2 %
Lymphocytes Absolute: 1.1 10*3/uL (ref 0.7–3.1)
Lymphs: 14 %
MCH: 29.3 pg (ref 26.6–33.0)
MCHC: 33.9 g/dL (ref 31.5–35.7)
MCV: 86 fL (ref 79–97)
Monocytes Absolute: 0.5 10*3/uL (ref 0.1–0.9)
Monocytes: 7 %
Neutrophils Absolute: 5.8 10*3/uL (ref 1.4–7.0)
Neutrophils: 75 %
Platelets: 172 10*3/uL (ref 150–450)
RBC: 4.92 x10E6/uL (ref 4.14–5.80)
RDW: 15.8 % — ABNORMAL HIGH (ref 11.6–15.4)
WBC: 7.8 10*3/uL (ref 3.4–10.8)

## 2019-10-10 LAB — TSH: TSH: 0.881 u[IU]/mL (ref 0.450–4.500)

## 2019-10-12 ENCOUNTER — Ambulatory Visit (INDEPENDENT_AMBULATORY_CARE_PROVIDER_SITE_OTHER): Payer: Medicaid Other | Admitting: Physician Assistant

## 2019-10-12 DIAGNOSIS — Z91199 Patient's noncompliance with other medical treatment and regimen due to unspecified reason: Secondary | ICD-10-CM

## 2019-10-12 DIAGNOSIS — Z5329 Procedure and treatment not carried out because of patient's decision for other reasons: Secondary | ICD-10-CM

## 2019-10-14 ENCOUNTER — Other Ambulatory Visit: Payer: Self-pay | Admitting: Family Medicine

## 2019-10-14 ENCOUNTER — Ambulatory Visit: Payer: Self-pay | Admitting: *Deleted

## 2019-10-14 DIAGNOSIS — F3130 Bipolar disorder, current episode depressed, mild or moderate severity, unspecified: Secondary | ICD-10-CM

## 2019-10-14 NOTE — Progress Notes (Signed)
Patient: Joshua Mays Male    DOB: 17-Feb-1966   54 y.o.   MRN: [id number] Visit Date: 10/15/2019  Today's Provider: Trinna Post, PA-C   Chief Complaint  Patient presents with  . Diabetes  . Eye Problem    Blurred Vision   Subjective:     HPI  Diabetes Mellitus Type II, Follow-up:   Lab Results  Component Value Date   HGBA1C 12.5 (A) 10/15/2019   HGBA1C 7.1 (A) 08/07/2019   HGBA1C 7.3 (A) 01/30/2019    Last seen for diabetes 2 months ago.  Management since then includes metformin 500 mg BID. He reports poor compliance with treatment. He stopped his metformin for 6 months. He has been taking this for the last ten days.  He is not having side effects.  Current symptoms include blurred vision and have been unchanged. He is reporting urinary frequency.  Home blood sugar records: fasting range: not checked  Episodes of hypoglycemia? no   Current insulin regiment: Is not on insulin Most Recent Eye Exam: 2019 with Dr. Matilde Sprang Weight trend: increasing steadily Prior visit with dietician: No Current exercise: none Current diet habits: in general, an "unhealthy" diet  Pertinent Labs:    Component Value Date/Time   CHOL 196 10/09/2019 1040   TRIG 185 (H) 10/09/2019 1040   HDL 60 10/09/2019 1040   LDLCALC 104 (H) 10/09/2019 1040   CREATININE 1.12 10/09/2019 1040   CREATININE 1.28 07/25/2017 0942    Wt Readings from Last 3 Encounters:  10/15/19 215 lb 9.6 oz (97.8 kg)  08/07/19 225 lb 9.6 oz (102.3 kg)  06/03/19 220 lb 7.4 oz (100 kg)   BP Readings from Last 3 Encounters:  10/15/19 (!) 154/103  08/07/19 (!) 131/91  06/06/19 123/78   Alcohol use: one to two beers once or twice a week.   Patient has bipolar depression and is taking 400 mg seroquel. Last cmet with elevated liver enzymes. He was previously seen at [facility].  ------------------------------------------------------------------------  Allergies  Allergen Reactions  . Cefaclor  Rash and Hives  . Ciprofloxacin Rash and Hives  . Aceprometazine Hives  . Ibuprofen Other (See Comments)    Bleeding into ileostomy bag  . Naproxen Other (See Comments)    Last time given had GI bleeding into his Ostomy bag      Current Outpatient Medications:  .  blood glucose meter kit and supplies KIT, Dispense based on patient and insurance preference. Test fasting sugar once a day and if any symptoms of hypoglycemia as directed. (FOR ICD-9 250.00, 250.01)., Disp: 1 each, Rfl: 0 .  docusate sodium (COLACE) 100 MG capsule, Take 1 capsule (100 mg total) by mouth 2 (two) times daily. (Patient not taking: Reported on 05/22/2019), Disp: 10 capsule, Rfl: 0 .  gabapentin (NEURONTIN) 600 MG tablet, Take 600 mg by mouth 3 (three) times daily., Disp: , Rfl:  .  metFORMIN (GLUCOPHAGE) 500 MG tablet, Take 1 tablet (500 mg total) by mouth 2 (two) times daily with a meal., Disp: 180 tablet, Rfl: 3 .  methocarbamol (ROBAXIN) 500 MG tablet, Take 1 tablet (500 mg total) by mouth every 6 (six) hours as needed for muscle spasms., Disp: 30 tablet, Rfl: 0 .  oxyCODONE 10 MG TABS, Take 1 tablet (10 mg total) by mouth every 4 (four) hours., Disp: 30 tablet, Rfl: 0 .  oxyCODONE-acetaminophen (PERCOCET) 7.5-325 MG tablet, oxycodone-acetaminophen 7.5 mg-325 mg tablet  1 tab every 6 hrs as needed for  pain MAY FILL 04/04/19, Disp: , Rfl:  .  QUEtiapine (SEROQUEL) 400 MG tablet, Take 1 tablet (400 mg total) by mouth at bedtime., Disp: 30 tablet, Rfl: 1 .  tiZANidine (ZANAFLEX) 4 MG tablet, tizanidine 4 mg tablet  Take 1 tablet 3 times a day by oral route., Disp: , Rfl:   Review of Systems  Social History   Tobacco Use  . Smoking status: Current Every Day Smoker    Packs/day: 0.25    Years: 16.00    Pack years: 4.00    Types: Cigarettes  . Smokeless tobacco: Never Used  Substance Use Topics  . Alcohol use: Yes    Alcohol/week: 0.0 standard drinks    Comment: occ beers not daily      Objective:   BP (!)  154/103 (BP Location: Right Arm, Cuff Size: Normal)   Pulse (!) 109   Temp (!) 97.5 F (36.4 C) (Temporal)   Resp 18   Ht _0  (1.803 m)   Wt 215 lb 9.6 oz (97.8 kg)   BMI 30.07 kg/m  Vitals:   10/15/19 0859 10/15/19 0902  BP: (!) 142/105 (!) 154/103  Pulse: (!) 103 (!) 109  Resp: 18   Temp: (!) 97.5 F (36.4 C)   TempSrc: Temporal   Weight: 215 lb 9.6 oz (97.8 kg)   Height: _1  (1.803 m)   Body mass index is 30.07 kg/m.   Physical Exam Constitutional:      Appearance: Normal appearance. He is obese.  Cardiovascular:     Rate and Rhythm: Normal rate and regular rhythm.     Heart sounds: Normal heart sounds.  Pulmonary:     Effort: Pulmonary effort is normal.     Breath sounds: Normal breath sounds.  Skin:    General: Skin is warm and dry.  Neurological:     Mental Status: He is alert and oriented to person, place, and time. Mental status is at baseline.  Psychiatric:        Mood and Affect: Mood normal.        Behavior: Behavior normal.      Results for orders placed or performed in visit on 10/15/19  POCT glycosylated hemoglobin (Hb A1C)  Result Value Ref Range   Hemoglobin A1C 12.5 (A) 4.0 - 5.6 %   Est. average glucose Bld gHb Est-mCnc 312        Assessment & Plan    1. Type 2 diabetes mellitus without complication, without long-term current use of insulin (HCC)  Worse, likely the cause of his blurred vision. Restart metformin 500 mg BID and follow up with Simona Huh one month.   - Ambulatory referral to Ophthalmology - Urine Microalbumin w/creat. ratio - POCT glycosylated hemoglobin (Hb A1C)  2. Elevated liver enzymes  Recheck CMET. May be increased weight, Seroquel, alcohol abuse. Adjust pending labs.   - Comprehensive Metabolic Panel (CMET)  3. Bipolar I disorder, most recent episode depressed ([place])  The entirety of the information documented in the History of Present Illness, Review of Systems and Physical Exam were personally obtained by  me. Portions of this information were initially documented by [facility] Ward, CMA and reviewed by me for thoroughness and accuracy.   The entirety of the information documented in the History of Present Illness, Review of Systems and Physical Exam were personally obtained by me. Portions of this information were initially documented by [facility], CMA and reviewed by me for thoroughness and accuracy.  Trinna Post, PA-C  [facility]

## 2019-10-14 NOTE — Telephone Encounter (Signed)
Request refill  for Seroquel. Per pharmacy note, pt needing lab work to be done. NOV  10/15/19

## 2019-10-14 NOTE — Progress Notes (Signed)
Hopefully this uncontrolled diabetic will show up for his appointment on 10-15-19. Thanks.

## 2019-10-14 NOTE — Telephone Encounter (Signed)
Copied from [place] [id number]. Topic: Quick Communication - Rx Refill/Question >> Oct 14, 2019  9:36 AM Yvette Rack wrote: Medication: QUEtiapine (SEROQUEL) 400 MG tablet  Has the patient contacted their pharmacy? yes   Preferred Pharmacy (with phone number or street name): [place], [place] - [address]  Phone: [phone]  Fax: [phone]  Agent: Please be advised that RX refills may take up to 3 business days. We ask that you follow-up with your pharmacy.

## 2019-10-14 NOTE — Telephone Encounter (Signed)
Patient is taking metformin 500 mg bid- started back on medication- last 9 days. Patient does not check glucose- he needs new meter. Patient missed appointment- no minutes on phone. Call to office they are going to see him- permission to schedule.  Advised patient to keep appointment- discuss meter, glucose goals, diet goals, exercise. Patient wants to get himself better. Patient advised to make appointment with optometrist it has been 2 years or more.  Reason for Disposition . [1] Blurred vision or visual changes AND [2] gradual onset (e.g., weeks, months)  Answer Assessment - Initial Assessment Questions 1. DESCRIPTION: "What is the vision loss like? Describe it for me." (e.g., complete vision loss, blurred vision, double vision, floaters, etc.)     Blurred vision 2. LOCATION: "One or both eyes?" If one, ask: "Which eye?"     Both eyes 3. SEVERITY: "Can you see anything?" If so, ask: "What can you see?" (e.g., fine print)     Yes- patient has readers/glasses, driving- long distance 4. ONSET: "When did this begin?" "Did it start suddenly or has this been gradual?"     Last 10 days 5. PATTERN: "Does this come and go, or has it been constant since it started?"     constant 6. PAIN: "Is there any pain in your eye(s)?"  (Scale 1-10; or mild, moderate, severe)     no 7. CONTACTS-GLASSES: "Do you wear contacts or glasses?"     yes 8. CAUSE: "What do you think is causing this visual problem?"     diabetes 9. OTHER SYMPTOMS: "Do you have any other symptoms?" (e.g., confusion, headache, arm or leg weakness, speech problems)     weakness 10. PREGNANCY: "Is there any chance you are pregnant?" "When was your last menstrual period?"       n/a  Protocols used: [place]

## 2019-10-15 ENCOUNTER — Other Ambulatory Visit: Payer: Self-pay

## 2019-10-15 ENCOUNTER — Ambulatory Visit (INDEPENDENT_AMBULATORY_CARE_PROVIDER_SITE_OTHER): Payer: Medicaid Other | Admitting: Physician Assistant

## 2019-10-15 ENCOUNTER — Encounter: Payer: Self-pay | Admitting: Physician Assistant

## 2019-10-15 VITALS — BP 154/103 | HR 109 | Temp 97.5°F | Resp 18 | Ht 71.0 in | Wt 215.6 lb

## 2019-10-15 DIAGNOSIS — R748 Abnormal levels of other serum enzymes: Secondary | ICD-10-CM

## 2019-10-15 DIAGNOSIS — F3130 Bipolar disorder, current episode depressed, mild or moderate severity, unspecified: Secondary | ICD-10-CM | POA: Diagnosis not present

## 2019-10-15 DIAGNOSIS — E119 Type 2 diabetes mellitus without complications: Secondary | ICD-10-CM

## 2019-10-15 LAB — POCT GLYCOSYLATED HEMOGLOBIN (HGB A1C)
Est. average glucose Bld gHb Est-mCnc: 312
Hemoglobin A1C: 12.5 % — AB (ref 4.0–5.6)

## 2019-10-15 MED ORDER — QUETIAPINE FUMARATE 400 MG PO TABS: 400.0000 mg | ORAL_TABLET | Freq: Every day | ORAL | 1 refills | Status: AC

## 2019-10-15 NOTE — Patient Instructions (Signed)

## 2019-10-16 ENCOUNTER — Telehealth: Payer: Self-pay | Admitting: Family Medicine

## 2019-10-16 ENCOUNTER — Telehealth: Payer: Self-pay

## 2019-10-16 LAB — COMPREHENSIVE METABOLIC PANEL
ALT: 105 IU/L — ABNORMAL HIGH (ref 0–44)
AST: 69 IU/L — ABNORMAL HIGH (ref 0–40)
Albumin/Globulin Ratio: 1.4 (ref 1.2–2.2)
Albumin: 4.3 g/dL (ref 3.8–4.9)
Alkaline Phosphatase: 168 IU/L — ABNORMAL HIGH (ref 39–117)
BUN/Creatinine Ratio: 11 (ref 9–20)
BUN: 11 mg/dL (ref 6–24)
Bilirubin Total: 1 mg/dL (ref 0.0–1.2)
CO2: 19 mmol/L — ABNORMAL LOW (ref 20–29)
Calcium: 9.8 mg/dL (ref 8.7–10.2)
Chloride: 96 mmol/L (ref 96–106)
Creatinine, Ser: 1.02 mg/dL (ref 0.76–1.27)
GFR calc Af Amer: 96 mL/min/{1.73_m2} (ref >59)
GFR calc non Af Amer: 83 mL/min/{1.73_m2} (ref >59)
Globulin, Total: 3 g/dL (ref 1.5–4.5)
Glucose: 364 mg/dL — ABNORMAL HIGH (ref 65–99)
Potassium: 4.1 mmol/L (ref 3.5–5.2)
Sodium: 131 mmol/L — ABNORMAL LOW (ref 134–144)
Total Protein: 7.3 g/dL (ref 6.0–8.5)

## 2019-10-16 LAB — MICROALBUMIN / CREATININE URINE RATIO
Creatinine, Urine: 83.4 mg/dL
Microalb/Creat Ratio: 57 mg/g creat — ABNORMAL HIGH (ref 0–29)
Microalbumin, Urine: 47.2 ug/mL

## 2019-10-16 NOTE — Telephone Encounter (Signed)
Left voicemail message for patient to return call if patient call back it is ok for the [place] to advise patient of message.

## 2019-10-16 NOTE — Telephone Encounter (Signed)
Patient returned call and was read lab note of Kathleene Hazel PA 10/16/2019.  He verbalized understanding. Patient has concerns. He states he needs guidance for diet and exercise. He needs help with his glucose meter. Patient has follow up with Vernie Murders PA scheduled 11/10/2019.

## 2019-10-16 NOTE — Telephone Encounter (Signed)
Left voicemail message for patient to return call if patient call back it is ok for the [place] to advise patient of message.

## 2019-10-16 NOTE — Telephone Encounter (Signed)
-----   Message from Trinna Post, [place] sent at 10/16/2019 12:20 PM EST ----- Liver enzymes still elevated but relatively stable. Keep taking metformin and follow up with Simona Huh for follow up testing. May consider some further labwork fo rliver at that point.

## 2019-10-16 NOTE — Telephone Encounter (Signed)
-----   Message from Trinna Post, [place] sent at 10/16/2019  1:42 PM EST ----- Urine microalbumin moderately increased. Will need repeat on this to determine if it is chronic and then consider adding ACEi. He can talk about this with Simona Huh at follow up.

## 2019-10-29 LAB — HM DIABETES EYE EXAM
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
## 2019-11-17 ENCOUNTER — Ambulatory Visit: Payer: Self-pay | Admitting: Family Medicine

## 2019-11-18 ENCOUNTER — Other Ambulatory Visit: Payer: Self-pay | Admitting: Family Medicine

## 2019-11-18 ENCOUNTER — Telehealth: Payer: Self-pay | Admitting: Physician Assistant

## 2019-11-23 NOTE — Telephone Encounter (Signed)
Received call from [place] that this patient was found deceased in his bathroom. Office was reporting that he was found with percocet bottle filled on 11/14/2019 #90 tablets with only 20 tablets left. Patient is likely to undergo medical examination and death certificate will likely be signed by medical examiner. However, will forward this to PCP in case there needs to be anything done on our end.

## 2019-11-23 DEATH — deceased

## 2020-10-13 ENCOUNTER — Telehealth: Payer: Self-pay

## 2020-10-13 NOTE — Telephone Encounter (Signed)
Copied from [place] [id number]. Topic: Medical Record Request - Other >> Oct 13, 2020 10:10 AM Alanda Slim E wrote:  Patient Name/DOB/MRN #: Joshua Mays / [birth year]/ MRN [id number] Requestor Name/Agency: [employer] [employer] Call Back #: [phone] Information Requested: request faxed over request on 1.18.22/ need last ten years of all medical records / Ref# [id number]/ they will refax request    Route to [facility] for [place] clinics. For all other clinics, route to the clinic's PEC Pool.

## 2020-10-13 NOTE — Telephone Encounter (Signed)
Received request. Placed for [facility] to process the request. TNP
